# Patient Record
Sex: Female | Born: 1940 | Race: Black or African American | Hispanic: No | Smoking: Former smoker
Health system: Southern US, Community
[De-identification: ages and names within clinical notes are randomized; demographics above are authoritative.]

## PROBLEM LIST (undated history)

## (undated) DIAGNOSIS — M545 Low back pain, unspecified: Secondary | ICD-10-CM

## (undated) DIAGNOSIS — I10 Essential (primary) hypertension: Secondary | ICD-10-CM

## (undated) DIAGNOSIS — E785 Hyperlipidemia, unspecified: Secondary | ICD-10-CM

## (undated) DIAGNOSIS — E876 Hypokalemia: Secondary | ICD-10-CM

## (undated) DIAGNOSIS — M810 Age-related osteoporosis without current pathological fracture: Secondary | ICD-10-CM

## (undated) DIAGNOSIS — G309 Alzheimer's disease, unspecified: Secondary | ICD-10-CM

## (undated) DIAGNOSIS — F0280 Dementia in other diseases classified elsewhere without behavioral disturbance: Secondary | ICD-10-CM

## (undated) DIAGNOSIS — E538 Deficiency of other specified B group vitamins: Secondary | ICD-10-CM

## (undated) DIAGNOSIS — K5909 Other constipation: Secondary | ICD-10-CM

## (undated) DIAGNOSIS — F329 Major depressive disorder, single episode, unspecified: Secondary | ICD-10-CM

## (undated) DIAGNOSIS — N390 Urinary tract infection, site not specified: Secondary | ICD-10-CM

## (undated) DIAGNOSIS — F32A Depression, unspecified: Secondary | ICD-10-CM

## (undated) DIAGNOSIS — M1990 Unspecified osteoarthritis, unspecified site: Secondary | ICD-10-CM

## (undated) DIAGNOSIS — R627 Adult failure to thrive: Secondary | ICD-10-CM

## (undated) DIAGNOSIS — J189 Pneumonia, unspecified organism: Secondary | ICD-10-CM

## (undated) DIAGNOSIS — R569 Unspecified convulsions: Secondary | ICD-10-CM

## (undated) DIAGNOSIS — R319 Hematuria, unspecified: Secondary | ICD-10-CM

## (undated) DIAGNOSIS — G43909 Migraine, unspecified, not intractable, without status migrainosus: Secondary | ICD-10-CM

## (undated) DIAGNOSIS — E870 Hyperosmolality and hypernatremia: Secondary | ICD-10-CM

## (undated) DIAGNOSIS — F482 Pseudobulbar affect: Secondary | ICD-10-CM

## (undated) HISTORY — DX: Low back pain, unspecified: M54.50

## (undated) HISTORY — DX: Unspecified osteoarthritis, unspecified site: M19.90

## (undated) HISTORY — DX: Migraine, unspecified, not intractable, without status migrainosus: G43.909

## (undated) HISTORY — DX: Pneumonia, unspecified organism: J18.9

## (undated) HISTORY — DX: Low back pain: M54.5

## (undated) HISTORY — DX: Hematuria, unspecified: R31.9

## (undated) HISTORY — DX: Hyperosmolality and hypernatremia: E87.0

## (undated) HISTORY — DX: Hypokalemia: E87.6

## (undated) HISTORY — PX: ABDOMINAL HYSTERECTOMY: SHX81

## (undated) HISTORY — DX: Pseudobulbar affect: F48.2

## (undated) HISTORY — DX: Alzheimer's disease, unspecified: G30.9

## (undated) HISTORY — PX: CHOLECYSTECTOMY: SHX55

## (undated) HISTORY — DX: Adult failure to thrive: R62.7

## (undated) HISTORY — DX: Urinary tract infection, site not specified: N39.0

## (undated) HISTORY — DX: Unspecified convulsions: R56.9

## (undated) HISTORY — PX: KNEE SURGERY: SHX244

## (undated) HISTORY — DX: Dementia in other diseases classified elsewhere, unspecified severity, without behavioral disturbance, psychotic disturbance, mood disturbance, and anxiety: F02.80

## (undated) HISTORY — DX: Age-related osteoporosis without current pathological fracture: M81.0

## (undated) HISTORY — DX: Other constipation: K59.09

## (undated) HISTORY — DX: Deficiency of other specified B group vitamins: E53.8

---

## 2013-11-11 ENCOUNTER — Emergency Department (HOSPITAL_BASED_OUTPATIENT_CLINIC_OR_DEPARTMENT_OTHER): Payer: Medicare Other

## 2013-11-11 ENCOUNTER — Encounter (HOSPITAL_BASED_OUTPATIENT_CLINIC_OR_DEPARTMENT_OTHER): Payer: Self-pay | Admitting: Emergency Medicine

## 2013-11-11 ENCOUNTER — Emergency Department (HOSPITAL_BASED_OUTPATIENT_CLINIC_OR_DEPARTMENT_OTHER)
Admission: EM | Admit: 2013-11-11 | Discharge: 2013-11-11 | Disposition: A | Discharge status: Home / Self Care | Payer: Medicare Other | Attending: Emergency Medicine | Admitting: Emergency Medicine

## 2013-11-11 DIAGNOSIS — Z791 Long term (current) use of non-steroidal anti-inflammatories (NSAID): Secondary | ICD-10-CM | POA: Insufficient documentation

## 2013-11-11 DIAGNOSIS — W19XXXA Unspecified fall, initial encounter: Secondary | ICD-10-CM

## 2013-11-11 DIAGNOSIS — R296 Repeated falls: Secondary | ICD-10-CM | POA: Insufficient documentation

## 2013-11-11 DIAGNOSIS — Y929 Unspecified place or not applicable: Secondary | ICD-10-CM | POA: Insufficient documentation

## 2013-11-11 DIAGNOSIS — S79919A Unspecified injury of unspecified hip, initial encounter: Secondary | ICD-10-CM | POA: Insufficient documentation

## 2013-11-11 DIAGNOSIS — E785 Hyperlipidemia, unspecified: Secondary | ICD-10-CM | POA: Insufficient documentation

## 2013-11-11 DIAGNOSIS — F3289 Other specified depressive episodes: Secondary | ICD-10-CM | POA: Insufficient documentation

## 2013-11-11 DIAGNOSIS — IMO0002 Reserved for concepts with insufficient information to code with codable children: Secondary | ICD-10-CM | POA: Insufficient documentation

## 2013-11-11 DIAGNOSIS — S99929A Unspecified injury of unspecified foot, initial encounter: Secondary | ICD-10-CM

## 2013-11-11 DIAGNOSIS — F329 Major depressive disorder, single episode, unspecified: Secondary | ICD-10-CM | POA: Insufficient documentation

## 2013-11-11 DIAGNOSIS — Y939 Activity, unspecified: Secondary | ICD-10-CM | POA: Insufficient documentation

## 2013-11-11 DIAGNOSIS — I10 Essential (primary) hypertension: Secondary | ICD-10-CM | POA: Insufficient documentation

## 2013-11-11 DIAGNOSIS — S79929A Unspecified injury of unspecified thigh, initial encounter: Secondary | ICD-10-CM

## 2013-11-11 DIAGNOSIS — S8990XA Unspecified injury of unspecified lower leg, initial encounter: Secondary | ICD-10-CM | POA: Insufficient documentation

## 2013-11-11 DIAGNOSIS — T07XXXA Unspecified multiple injuries, initial encounter: Secondary | ICD-10-CM

## 2013-11-11 DIAGNOSIS — F0390 Unspecified dementia without behavioral disturbance: Secondary | ICD-10-CM | POA: Insufficient documentation

## 2013-11-11 DIAGNOSIS — Z79899 Other long term (current) drug therapy: Secondary | ICD-10-CM | POA: Insufficient documentation

## 2013-11-11 DIAGNOSIS — S99919A Unspecified injury of unspecified ankle, initial encounter: Secondary | ICD-10-CM

## 2013-11-11 HISTORY — DX: Major depressive disorder, single episode, unspecified: F32.9

## 2013-11-11 HISTORY — DX: Essential (primary) hypertension: I10

## 2013-11-11 HISTORY — DX: Hyperlipidemia, unspecified: E78.5

## 2013-11-11 HISTORY — DX: Depression, unspecified: F32.A

## 2013-11-11 MED ORDER — NAPROXEN 500 MG PO TABS: 500.0000 mg | ORAL_TABLET | Freq: Two times a day (BID) | ORAL | Status: DC

## 2013-11-11 NOTE — ED Provider Notes (Signed)
CSN: [id number]     Arrival date & time 11/11/13  2107 History  This chart was scribed for Glynn Octave, MD by Leone Payor, ED Scribe. This patient was seen in room MH11/MH11 and the patient's care was started 9:57 PM.    Chief Complaint  Patient presents with  . Fall    The history is provided by the patient. No language interpreter was used.    HPI Comments: Melissa Jarvis is a 73 y.o. female with past medical history of dementia, HTN who presents to the Emergency Department complaining of a fall that occurred about 20 hours ago. Pt's daughter states she fell on the first step of the porch but did not witness it. Pt was complaining of a HA and right sided pain including right hip pain for the past 12 hours. She currently complains of right knee pain. Daughter gave pt half a hydrocodone-ibuprofen without relief. She does not take blood thinners aside from ASA. She denies chest pain, SOB, abdominal pain.   Past Medical History  Diagnosis Date  . Depressed   . Hypertension   . Hyperlipidemia   . Dementia    Past Surgical History  Procedure Laterality Date  . Abdominal hysterectomy     No family history on file. History  Substance Use Topics  . Smoking status: Not on file  . Smokeless tobacco: Not on file  . Alcohol Use: Not on file   OB History   Grav Para Term Preterm Abortions TAB SAB Ect Mult Living                 Review of Systems A complete 10 system review of systems was obtained and all systems are negative except as noted in the HPI and PMH.   Allergies  Review of patient's allergies indicates not on file.  Home Medications   Current Outpatient Rx  Name  Route  Sig  Dispense  Refill  . amLODipine-benazepril (LOTREL) 10-40 MG per capsule   Oral   Take 1 capsule by mouth daily.         . celecoxib (CELEBREX) 200 MG capsule   Oral   Take 200 mg by mouth 2 (two) times daily.         . hydrALAZINE (APRESOLINE) 25 MG tablet   Oral   Take 25 mg by mouth  3 (three) times daily.         Marland Kitchen HYDROcodone-ibuprofen (VICOPROFEN) 7.5-200 MG per tablet   Oral   Take 1 tablet by mouth every 8 (eight) hours as needed for moderate pain.         Marland Kitchen PARoxetine (PAXIL) 20 MG tablet   Oral   Take 20 mg by mouth daily.         . pravastatin (PRAVACHOL) 40 MG tablet   Oral   Take 40 mg by mouth daily.         . rivastigmine (EXELON) 9.5 mg/24hr   Transdermal   Place 9.5 mg onto the skin daily.         . naproxen (NAPROSYN) 500 MG tablet   Oral   Take 1 tablet (500 mg total) by mouth 2 (two) times daily.   30 tablet   0    Triage Vitals: BP 197/89  Pulse 62  Temp(Src) 98 F (36.7 C) (Oral)  Resp 16  SpO2 99%  Physical Exam  Nursing note and vitals reviewed. Constitutional: She is oriented to person, place, and time. She appears well-developed and well-nourished.  No distress.  Non-toxic appearing   HENT:  Head: Normocephalic and atraumatic.  Neck: Normal range of motion. Neck supple.  Cardiovascular: Normal rate, regular rhythm and normal heart sounds.   Pulmonary/Chest: Effort normal and breath sounds normal. No respiratory distress. She has no wheezes. She has no rales. She exhibits no tenderness.  Abdominal: Soft. She exhibits no distension and no mass. There is no tenderness. There is no rebound and no guarding.  Musculoskeletal:  Tenderness to palpation to midline lumbar spine. Right knee has well healed surgical incision. Flexion and extension intact. No ligament laxity. Intact distal pulses.   Neurological: She is alert and oriented to person, place, and time.  Cranial nerves 2-12 intact. 5/5 strength throughout, no ataxia on finger to nose. Romberg neg. Normal gait. No nystagmus.    Skin: Skin is warm and dry.  Psychiatric: She has a normal mood and affect.    ED Course  Procedures (including critical care time)  DIAGNOSTIC STUDIES: Oxygen Saturation is 99% on RA, normal by my interpretation.    COORDINATION OF  CARE: 9:53 PM Will order imaging. Discussed treatment plan with pt at bedside and pt agreed to plan.   Labs Review Labs Reviewed - No data to display Imaging Review Dg Lumbar Spine Complete  11/11/2013   CLINICAL DATA:  Trauma, fall, right hip and back pain  EXAM: LUMBAR SPINE - COMPLETE 4+ VIEW  COMPARISON:  None.  FINDINGS: Limited exam because of positioning. Diffuse lumbar spondylosis and degenerative change with a mild scoliosis. Lower thoracic degenerative changes well. No gross malalignment. No significant compression fracture or focal kyphosis. Lateral views are limited because of the oblique projection and positioning. Atherosclerosis of the aorta. Prior cholecystectomy noted. Scattered stool throughout the colon. Negative for obstruction.  IMPRESSION: Diffuse degenerative changes. Limited exam but no definite acute osseous finding.   Electronically Signed   By: Ruel Favorsrevor  Shick M.D.   On: 11/11/2013 23:21   Dg Hip Complete Right  11/11/2013   CLINICAL DATA:  Trauma, fall, right hip pain  EXAM: RIGHT HIP - COMPLETE 2+ VIEW  COMPARISON:  None.  FINDINGS: Bones are osteopenic. Minor degenerative changes of the lumbar spine, SI joints and both hips. Right hip appears intact. No displaced fracture or malalignment. Bony pelvis intact.  IMPRESSION: No acute osseous finding   Electronically Signed   By: Ruel Favorsrevor  Shick M.D.   On: 11/11/2013 23:19   Ct Head Wo Contrast  11/11/2013   CLINICAL DATA:  Unwitnessed fall, headache, hypertension  EXAM: CT HEAD WITHOUT CONTRAST  CT CERVICAL SPINE WITHOUT CONTRAST  TECHNIQUE: Multidetector CT imaging of the head and cervical spine was performed following the standard protocol without intravenous contrast. Multiplanar CT image reconstructions of the cervical spine were also generated.  COMPARISON:  07/02/2012  FINDINGS: CT HEAD FINDINGS  Brain atrophy evident with chronic periventricular microvascular ischemic change in the white matter as before. No acute  intracranial hemorrhage, mass lesion, definite acute infarction, midline shift, herniation, hydrocephalus, or extra-axial fluid collection. Cisterns patent. No cerebellar abnormality. Symmetric orbits. Mastoids and sinuses are clear.  CT CERVICAL SPINE FINDINGS  Normal alignment. Diffuse cervical spondylosis and degenerative disc disease at all levels. no fracture, compression deformity, malalignment, or focal kyphosis. Normal prevertebral soft tissues. Intact odontoid. Facets aligned. Coronary calcifications evident.  IMPRESSION: Stable brain atrophy and chronic microvascular ischemic changes in the white matter. No acute intracranial finding.  Cervical degenerative change and spondylosis without acute osseous finding or fracture.   Electronically Signed  By: Ruel Favors M.D.   On: 11/11/2013 22:43   Ct Cervical Spine Wo Contrast  11/11/2013   CLINICAL DATA:  Unwitnessed fall, headache, hypertension  EXAM: CT HEAD WITHOUT CONTRAST  CT CERVICAL SPINE WITHOUT CONTRAST  TECHNIQUE: Multidetector CT imaging of the head and cervical spine was performed following the standard protocol without intravenous contrast. Multiplanar CT image reconstructions of the cervical spine were also generated.  COMPARISON:  07/02/2012  FINDINGS: CT HEAD FINDINGS  Brain atrophy evident with chronic periventricular microvascular ischemic change in the white matter as before. No acute intracranial hemorrhage, mass lesion, definite acute infarction, midline shift, herniation, hydrocephalus, or extra-axial fluid collection. Cisterns patent. No cerebellar abnormality. Symmetric orbits. Mastoids and sinuses are clear.  CT CERVICAL SPINE FINDINGS  Normal alignment. Diffuse cervical spondylosis and degenerative disc disease at all levels. no fracture, compression deformity, malalignment, or focal kyphosis. Normal prevertebral soft tissues. Intact odontoid. Facets aligned. Coronary calcifications evident.  IMPRESSION: Stable brain atrophy and  chronic microvascular ischemic changes in the white matter. No acute intracranial finding.  Cervical degenerative change and spondylosis without acute osseous finding or fracture.   Electronically Signed   By: Ruel Favors M.D.   On: 11/11/2013 22:43   Dg Knee Complete 4 Views Right  11/11/2013   CLINICAL DATA:  Trauma, fall.  Right knee pain.  EXAM: RIGHT KNEE - COMPLETE 4+ VIEW  COMPARISON:  None available for comparison at time of study interpretation.  FINDINGS: The patient is status post total knee arthroplasties, with intact well seated femoral and acetabular components. No periprosthetic lucency. Normal appearance of the resurfaced patella. No acute fracture deformity or dislocation. No destructive bony lesions. Soft tissue planes are nonsuspicious. Sub cm linear calcification projecting posterior to the proximal fibula could reflect remote injury or vascular etiology.  IMPRESSION: No acute fracture deformity or dislocation.  Status post right knee total arthroplasty without radiographic findings of hardware failure.   Electronically Signed   By: Awilda Metro   On: 11/11/2013 23:26    EKG Interpretation   None       MDM   1. Fall   2. Multiple contusions    Fall at 2 AM last night landing on right side. Hit head or loss of consciousness. Complaining of pain in right leg, hip, knee and low back. No abdominal pain, chest pain or neck pain.  Imaging negative for acute pathology. Patient ambulatory without a problem and feels at her baseline.    I personally performed the services described in this documentation, which was scribed in my presence. The recorded information has been reviewed and is accurate.    Glynn Octave, MD 11/11/13 [phone]

## 2013-11-11 NOTE — ED Notes (Addendum)
Pt c/o fall x lst PM c/o right leg, arm and head pain

## 2013-11-11 NOTE — Discharge Instructions (Signed)

## 2014-03-04 ENCOUNTER — Other Ambulatory Visit (HOSPITAL_BASED_OUTPATIENT_CLINIC_OR_DEPARTMENT_OTHER): Payer: Self-pay | Admitting: Internal Medicine

## 2014-09-06 ENCOUNTER — Emergency Department (HOSPITAL_BASED_OUTPATIENT_CLINIC_OR_DEPARTMENT_OTHER)
Admission: EM | Admit: 2014-09-06 | Discharge: 2014-09-06 | Disposition: A | Discharge status: Home / Self Care | Payer: Medicare Other | Attending: Emergency Medicine | Admitting: Emergency Medicine

## 2014-09-06 ENCOUNTER — Emergency Department (HOSPITAL_BASED_OUTPATIENT_CLINIC_OR_DEPARTMENT_OTHER): Payer: Medicare Other

## 2014-09-06 ENCOUNTER — Encounter (HOSPITAL_BASED_OUTPATIENT_CLINIC_OR_DEPARTMENT_OTHER): Payer: Self-pay | Admitting: *Deleted

## 2014-09-06 DIAGNOSIS — F329 Major depressive disorder, single episode, unspecified: Secondary | ICD-10-CM | POA: Insufficient documentation

## 2014-09-06 DIAGNOSIS — I10 Essential (primary) hypertension: Secondary | ICD-10-CM | POA: Insufficient documentation

## 2014-09-06 DIAGNOSIS — Z791 Long term (current) use of non-steroidal anti-inflammatories (NSAID): Secondary | ICD-10-CM | POA: Diagnosis not present

## 2014-09-06 DIAGNOSIS — Z87891 Personal history of nicotine dependence: Secondary | ICD-10-CM | POA: Insufficient documentation

## 2014-09-06 DIAGNOSIS — Z880 Allergy status to penicillin: Secondary | ICD-10-CM | POA: Insufficient documentation

## 2014-09-06 DIAGNOSIS — M549 Dorsalgia, unspecified: Secondary | ICD-10-CM

## 2014-09-06 DIAGNOSIS — F0390 Unspecified dementia without behavioral disturbance: Secondary | ICD-10-CM | POA: Diagnosis not present

## 2014-09-06 DIAGNOSIS — M545 Low back pain: Secondary | ICD-10-CM | POA: Insufficient documentation

## 2014-09-06 LAB — URINALYSIS, ROUTINE W REFLEX MICROSCOPIC
Bilirubin Urine: NEGATIVE
GLUCOSE, UA: NEGATIVE mg/dL
Hgb urine dipstick: NEGATIVE
KETONES UR: NEGATIVE mg/dL
LEUKOCYTES UA: NEGATIVE
Nitrite: NEGATIVE
Protein, ur: NEGATIVE mg/dL
SPECIFIC GRAVITY, URINE: 1.013 (ref 1.005–1.030)
Urobilinogen, UA: 1 mg/dL (ref 0.0–1.0)
pH: 7 (ref 5.0–8.0)

## 2014-09-06 NOTE — ED Notes (Signed)
Pt reports back pain x 9 months- has seen PCP without any diagnosis or testing- seen at Urgent Care 2 months ago and ?bowel blockage- Last BM today, small per pt report

## 2014-09-06 NOTE — ED Provider Notes (Signed)
CSN: [id number]     Arrival date & time 09/06/14  1429 History  This chart was scribed for Toy BakerAnthony T Kindra Bickham, MD by Modena JanskyAlbert Thayil, ED Scribe. This patient was seen in room MH04/MH04 and the patient's care was started at 3:07 PM.   Chief Complaint  Patient presents with  . Back Pain   The history is provided by the patient. No language interpreter was used.   HPI Comments: Grayland JackBarbara Guarino is a 73 y.o. female who presents to the Emergency Department complaining of constant moderate lower back pain that has been going on for about 9 months. Her daughter reports that pt has been seeing her PCP and Urgent Care for the back pain. She states that pt had an x ray in September revealing a bowel blockage in pt. She reports she wanted another x ray in pt today. She denies any falls or trauma in pt. She states that pt can ambulate okay and has been eating normally. She states that pt has been taking hydrocodone and tylenol without any relief. She denies any unusual incontinence or numbness in pt. She also denies any emesis or fever in pt.   Past Medical History  Diagnosis Date  . Depressed   . Hypertension   . Hyperlipidemia   . Dementia   . Dementia    Past Surgical History  Procedure Laterality Date  . Abdominal hysterectomy    . Cholecystectomy     No family history on file. History  Substance Use Topics  . Smoking status: Former Games developermoker  . Smokeless tobacco: Not on file  . Alcohol Use: Yes     Comment: occasional   OB History    No data available     Review of Systems  Constitutional: Negative for fever and appetite change.  Gastrointestinal: Negative for vomiting.  Musculoskeletal: Positive for back pain.  All other systems reviewed and are negative.   Allergies  Penicillins  Home Medications   Prior to Admission medications   Medication Sig Start Date End Date Taking? Authorizing Provider  amLODipine-benazepril (LOTREL) 10-40 MG per capsule Take 1 capsule by mouth daily.    Yes Historical Provider, MD  celecoxib (CELEBREX) 200 MG capsule Take 200 mg by mouth 2 (two) times daily.   Yes Historical Provider, MD  hydrALAZINE (APRESOLINE) 25 MG tablet Take 25 mg by mouth 3 (three) times daily.   Yes Historical Provider, MD  HYDROcodone-ibuprofen (VICOPROFEN) 7.5-200 MG per tablet Take 1 tablet by mouth every 8 (eight) hours as needed for moderate pain.   Yes Historical Provider, MD  naproxen (NAPROSYN) 500 MG tablet Take 1 tablet (500 mg total) by mouth 2 (two) times daily. 11/11/13  Yes [name], MD  PARoxetine (PAXIL) 20 MG tablet Take 20 mg by mouth daily.   Yes Historical Provider, MD  pravastatin (PRAVACHOL) 40 MG tablet Take 40 mg by mouth daily.   Yes Historical Provider, MD  rivastigmine (EXELON) 9.5 mg/24hr Place 9.5 mg onto the skin daily.   Yes Historical Provider, MD   BP 194/66 mmHg  Pulse 65  Temp(Src) 98.7 F (37.1 C) (Oral)  Resp 20  Ht 5\' 4"  (1.626 m)  Wt 201 lb (91.173 kg)  BMI 34.48 kg/m2  SpO2 98% Physical Exam  Constitutional: She is oriented to person, place, and time. She appears well-developed and well-nourished.  Non-toxic appearance. No distress.  HENT:  Head: Normocephalic and atraumatic.  Eyes: Conjunctivae, EOM and lids are normal. Pupils are equal, round, and reactive to light.  Neck: Normal range of motion. Neck supple. No tracheal deviation present. No thyroid mass present.  Cardiovascular: Normal rate, regular rhythm and normal heart sounds.  Exam reveals no gallop.   No murmur heard. Pulmonary/Chest: Effort normal and breath sounds normal. No stridor. No respiratory distress. She has no decreased breath sounds. She has no wheezes. She has no rhonchi. She has no rales.  Abdominal: Soft. Normal appearance and bowel sounds are normal. She exhibits no distension. There is tenderness. There is no rebound, no guarding and no CVA tenderness.  Mild TTP to the left perispinal area. No CVA tenderness.  Musculoskeletal: Normal range  of motion. She exhibits no edema or tenderness.  Neurological: She is alert and oriented to person, place, and time. She has normal strength. No cranial nerve deficit or sensory deficit. GCS eye subscore is 4. GCS verbal subscore is 5. GCS motor subscore is 6.  Skin: Skin is warm and dry. No abrasion and no rash noted.  Psychiatric: She has a normal mood and affect. Her speech is normal and behavior is normal.  Nursing note and vitals reviewed.   ED Course  Procedures (including critical care time) DIAGNOSTIC STUDIES: Oxygen Saturation is 98% on RA, normal by my interpretation.    COORDINATION OF CARE: 3:11 PM- Pt advised of plan for treatment which includes radiology and labs and pt agrees.  Labs Review Labs Reviewed  URINE CULTURE  URINALYSIS, ROUTINE W REFLEX MICROSCOPIC    Imaging Review Dg Lumbar Spine Complete  09/06/2014   CLINICAL DATA:  Low back pain nine months getting worse.  No injury.  EXAM: LUMBAR SPINE - COMPLETE 4+ VIEW  COMPARISON:  04/13/2014  FINDINGS: Subtle curvature convex to the right unchanged. Vertebral body heights are within normal. There is mild spondylosis throughout the lumbar spine. There is disc space narrowing at the L3-4, L4-5 and L5-S1 levels. There is no compression fracture or subluxation. There is moderate facet arthropathy. There is calcified plaque over the abdominal aorta.  IMPRESSION: Mild spondylosis throughout the lumbar spine with disc disease from the L3-4 level to the L5-S1 level. No acute findings.   Electronically Signed   By: Elberta Fortisaniel  Boyle M.D.   On: 09/06/2014 15:55     EKG Interpretation None      MDM   Final diagnoses:  Back pain    Patient's urinalysis and lumbar spine series without acute findings. Suspect musculoskeletal pain versus osteoarthritis and she is stable for discharge and was see her doctor next week    Toy BakerAnthony T Julian Medina, MD 09/06/14 1649

## 2014-09-06 NOTE — Discharge Instructions (Signed)
Back Pain, Adult Low back pain is very common. About 1 in 5 people have back pain.The cause of low back pain is rarely dangerous. The pain often gets better over time.About half of people with a sudden onset of back pain feel better in just 2 weeks. About 8 in 10 people feel better by 6 weeks.  CAUSES Some common causes of back pain include:  Strain of the muscles or ligaments supporting the spine.  Wear and tear (degeneration) of the spinal discs.  Arthritis.  Direct injury to the back. DIAGNOSIS Most of the time, the direct cause of low back pain is not known.However, back pain can be treated effectively even when the exact cause of the pain is unknown.Answering your caregiver's questions about your overall health and symptoms is one of the most accurate ways to make sure the cause of your pain is not dangerous. If your caregiver needs more information, he or she may order lab work or imaging tests (X-rays or MRIs).However, even if imaging tests show changes in your back, this usually does not require surgery. HOME CARE INSTRUCTIONS For many people, back pain returns.Since low back pain is rarely dangerous, it is often a condition that people can learn to manageon their own.   Remain active. It is stressful on the back to sit or stand in one place. Do not sit, drive, or stand in one place for more than 30 minutes at a time. Take short walks on level surfaces as soon as pain allows.Try to increase the length of time you walk each day.  Do not stay in bed.Resting more than 1 or 2 days can delay your recovery.  Do not avoid exercise or work.Your body is made to move.It is not dangerous to be active, even though your back may hurt.Your back will likely heal faster if you return to being active before your pain is gone.  Pay attention to your body when you bend and lift. Many people have less discomfortwhen lifting if they bend their knees, keep the load close to their bodies,and  avoid twisting. Often, the most comfortable positions are those that put less stress on your recovering back.  Find a comfortable position to sleep. Use a firm mattress and lie on your side with your knees slightly bent. If you lie on your back, put a pillow under your knees.  Only take over-the-counter or prescription medicines as directed by your caregiver. Over-the-counter medicines to reduce pain and inflammation are often the most helpful.Your caregiver may prescribe muscle relaxant drugs.These medicines help dull your pain so you can more quickly return to your normal activities and healthy exercise.  Put ice on the injured area.  Put ice in a plastic bag.  Place a towel between your skin and the bag.  Leave the ice on for 15-20 minutes, 03-04 times a day for the first 2 to 3 days. After that, ice and heat may be alternated to reduce pain and spasms.  Ask your caregiver about trying back exercises and gentle massage. This may be of some benefit.  Avoid feeling anxious or stressed.Stress increases muscle tension and can worsen back pain.It is important to recognize when you are anxious or stressed and learn ways to manage it.Exercise is a great option. SEEK MEDICAL CARE IF:  You have pain that is not relieved with rest or medicine.  You have pain that does not improve in 1 week.  You have new symptoms.  You are generally not feeling well. SEEK   IMMEDIATE MEDICAL CARE IF:   You have pain that radiates from your back into your legs.  You develop new bowel or bladder control problems.  You have unusual weakness or numbness in your arms or legs.  You develop nausea or vomiting.  You develop abdominal pain.  You feel faint. Document Released: 10/03/2005 Document Revised: 04/03/2012 Document Reviewed: 02/04/2014 [employer] Patient Information 2015 [employer]. This information is not intended to replace advice given to you by your health care provider. Make sure you  discuss any questions you have with your health care provider.  

## 2014-09-07 LAB — URINE CULTURE

## 2014-12-16 ENCOUNTER — Emergency Department (HOSPITAL_BASED_OUTPATIENT_CLINIC_OR_DEPARTMENT_OTHER)
Admission: EM | Admit: 2014-12-16 | Discharge: 2014-12-16 | Disposition: A | Discharge status: Home / Self Care | Payer: Medicare Other | Attending: Emergency Medicine | Admitting: Emergency Medicine

## 2014-12-16 ENCOUNTER — Emergency Department (HOSPITAL_BASED_OUTPATIENT_CLINIC_OR_DEPARTMENT_OTHER): Payer: Medicare Other

## 2014-12-16 ENCOUNTER — Encounter (HOSPITAL_BASED_OUTPATIENT_CLINIC_OR_DEPARTMENT_OTHER): Payer: Self-pay | Admitting: *Deleted

## 2014-12-16 DIAGNOSIS — Z79899 Other long term (current) drug therapy: Secondary | ICD-10-CM | POA: Insufficient documentation

## 2014-12-16 DIAGNOSIS — S0101XA Laceration without foreign body of scalp, initial encounter: Secondary | ICD-10-CM | POA: Insufficient documentation

## 2014-12-16 DIAGNOSIS — E785 Hyperlipidemia, unspecified: Secondary | ICD-10-CM | POA: Insufficient documentation

## 2014-12-16 DIAGNOSIS — F0390 Unspecified dementia without behavioral disturbance: Secondary | ICD-10-CM | POA: Insufficient documentation

## 2014-12-16 DIAGNOSIS — Y998 Other external cause status: Secondary | ICD-10-CM | POA: Diagnosis not present

## 2014-12-16 DIAGNOSIS — Z87891 Personal history of nicotine dependence: Secondary | ICD-10-CM | POA: Insufficient documentation

## 2014-12-16 DIAGNOSIS — W19XXXA Unspecified fall, initial encounter: Secondary | ICD-10-CM

## 2014-12-16 DIAGNOSIS — S299XXA Unspecified injury of thorax, initial encounter: Secondary | ICD-10-CM | POA: Diagnosis not present

## 2014-12-16 DIAGNOSIS — Z880 Allergy status to penicillin: Secondary | ICD-10-CM | POA: Insufficient documentation

## 2014-12-16 DIAGNOSIS — R001 Bradycardia, unspecified: Secondary | ICD-10-CM | POA: Diagnosis not present

## 2014-12-16 DIAGNOSIS — Y92009 Unspecified place in unspecified non-institutional (private) residence as the place of occurrence of the external cause: Secondary | ICD-10-CM | POA: Diagnosis not present

## 2014-12-16 DIAGNOSIS — I10 Essential (primary) hypertension: Secondary | ICD-10-CM | POA: Insufficient documentation

## 2014-12-16 DIAGNOSIS — Z791 Long term (current) use of non-steroidal anti-inflammatories (NSAID): Secondary | ICD-10-CM | POA: Diagnosis not present

## 2014-12-16 DIAGNOSIS — F329 Major depressive disorder, single episode, unspecified: Secondary | ICD-10-CM | POA: Diagnosis not present

## 2014-12-16 DIAGNOSIS — W1839XA Other fall on same level, initial encounter: Secondary | ICD-10-CM | POA: Insufficient documentation

## 2014-12-16 DIAGNOSIS — Z23 Encounter for immunization: Secondary | ICD-10-CM | POA: Diagnosis not present

## 2014-12-16 DIAGNOSIS — Y9301 Activity, walking, marching and hiking: Secondary | ICD-10-CM | POA: Insufficient documentation

## 2014-12-16 DIAGNOSIS — S0990XA Unspecified injury of head, initial encounter: Secondary | ICD-10-CM | POA: Diagnosis present

## 2014-12-16 LAB — BASIC METABOLIC PANEL
Anion gap: 6 (ref 5–15)
BUN: 14 mg/dL (ref 6–23)
CO2: 29 mmol/L (ref 19–32)
Calcium: 9.1 mg/dL (ref 8.4–10.5)
Chloride: 109 mmol/L (ref 96–112)
Creatinine, Ser: 0.7 mg/dL (ref 0.50–1.10)
GFR, EST NON AFRICAN AMERICAN: 84 mL/min — AB (ref >90)
Glucose, Bld: 90 mg/dL (ref 70–99)
POTASSIUM: 3.8 mmol/L (ref 3.5–5.1)
SODIUM: 144 mmol/L (ref 135–145)

## 2014-12-16 LAB — CBC WITH DIFFERENTIAL/PLATELET
BASOS ABS: 0 10*3/uL (ref 0.0–0.1)
BASOS PCT: 1 % (ref 0–1)
EOS ABS: 0.1 10*3/uL (ref 0.0–0.7)
EOS PCT: 2 % (ref 0–5)
HEMATOCRIT: 40.9 % (ref 36.0–46.0)
Hemoglobin: 13.5 g/dL (ref 12.0–15.0)
LYMPHS PCT: 22 % (ref 12–46)
Lymphs Abs: 0.9 10*3/uL (ref 0.7–4.0)
MCH: 31.9 pg (ref 26.0–34.0)
MCHC: 33 g/dL (ref 30.0–36.0)
MCV: 96.7 fL (ref 78.0–100.0)
Monocytes Absolute: 0.3 10*3/uL (ref 0.1–1.0)
Monocytes Relative: 6 % (ref 3–12)
Neutro Abs: 2.9 10*3/uL (ref 1.7–7.7)
Neutrophils Relative %: 69 % (ref 43–77)
PLATELETS: 134 10*3/uL — AB (ref 150–400)
RBC: 4.23 MIL/uL (ref 3.87–5.11)
RDW: 12.5 % (ref 11.5–15.5)
WBC: 4.2 10*3/uL (ref 4.0–10.5)

## 2014-12-16 LAB — URINALYSIS, ROUTINE W REFLEX MICROSCOPIC
BILIRUBIN URINE: NEGATIVE
GLUCOSE, UA: NEGATIVE mg/dL
HGB URINE DIPSTICK: NEGATIVE
Ketones, ur: NEGATIVE mg/dL
Leukocytes, UA: NEGATIVE
Nitrite: NEGATIVE
Protein, ur: NEGATIVE mg/dL
SPECIFIC GRAVITY, URINE: 1.01 (ref 1.005–1.030)
Urobilinogen, UA: 1 mg/dL (ref 0.0–1.0)
pH: 7 (ref 5.0–8.0)

## 2014-12-16 MED ORDER — LIDOCAINE-EPINEPHRINE 2 %-1:100000 IJ SOLN
INTRAMUSCULAR | Status: AC
  Administered 2014-12-16: 20 mL
  Filled 2014-12-16: qty 1

## 2014-12-16 MED ORDER — HYDROCODONE-ACETAMINOPHEN 5-325 MG PO TABS: 1.0000 | ORAL_TABLET | Freq: Four times a day (QID) | ORAL | Status: DC | PRN

## 2014-12-16 MED ORDER — HYDROCODONE-ACETAMINOPHEN 5-325 MG PO TABS
1.0000 | ORAL_TABLET | Freq: Once | ORAL | Status: AC
  Administered 2014-12-16: 1 via ORAL
  Filled 2014-12-16: qty 1

## 2014-12-16 MED ORDER — LIDOCAINE-EPINEPHRINE 2 %-1:100000 IJ SOLN
20.0000 mL | Freq: Once | INTRAMUSCULAR | Status: AC
  Administered 2014-12-16: 20 mL

## 2014-12-16 MED ORDER — TETANUS-DIPHTH-ACELL PERTUSSIS 5-2.5-18.5 LF-MCG/0.5 IM SUSP
0.5000 mL | Freq: Once | INTRAMUSCULAR | Status: AC
  Administered 2014-12-16: 0.5 mL via INTRAMUSCULAR
  Filled 2014-12-16: qty 0.5

## 2014-12-16 NOTE — ED Provider Notes (Signed)
CSN: [id number]     Arrival date & time 12/16/14  1054 History   None    Chief Complaint  Patient presents with  . Fall   Level V caveat dementia, history is obtained from her daughters who accompany her  (Consider location/radiation/quality/duration/timing/severity/associated sxs/prior Treatment) HPI Patient has fallen twice since 3:30 AM today. She fell at home while walking. First fall was at 3:30 AM with no injury. Fall unwitnessed. Second fall occurred at 6:30 AM today. She injured her head and right ribs as result of event and suffered laceration to her scalp. Presently she complains of right rib pain. She denies other complaint. No treatment prior to coming here. Patient  Started on [place]  amd Mirtazapin on 12/10/2014. Mirtazapin started for mood stabilization and orphenadrine started for "knee pain Past Medical History  Diagnosis Date  . Depressed   . Hypertension   . Hyperlipidemia   . Dementia   . Dementia    Past Surgical History  Procedure Laterality Date  . Abdominal hysterectomy    . Cholecystectomy     No family history on file. History  Substance Use Topics  . Smoking status: Former Games developer  . Smokeless tobacco: Not on file  . Alcohol Use: No     Comment: occasional   no tobacco no alcohol no drugs OB History    No data available     Review of Systems  Unable to perform ROS Cardiovascular: Positive for chest pain.       Right rib pain  Musculoskeletal: Positive for gait problem.  Skin: Positive for wound.       Scalp laceration   otherwise unobtainable, patient's demented    Allergies  Penicillins  Home Medications   Prior to Admission medications   Medication Sig Start Date End Date Taking? Authorizing Provider  amLODipine-benazepril (LOTREL) 10-40 MG per capsule Take 1 capsule by mouth daily.    Historical Provider, MD  celecoxib (CELEBREX) 200 MG capsule Take 200 mg by mouth 2 (two) times daily.    Historical Provider, MD  hydrALAZINE  (APRESOLINE) 25 MG tablet Take 25 mg by mouth 3 (three) times daily.    Historical Provider, MD  HYDROcodone-ibuprofen (VICOPROFEN) 7.5-200 MG per tablet Take 1 tablet by mouth every 8 (eight) hours as needed for moderate pain.    Historical Provider, MD  naproxen (NAPROSYN) 500 MG tablet Take 1 tablet (500 mg total) by mouth 2 (two) times daily. 11/11/13   Glynn Octave, MD  PARoxetine (PAXIL) 20 MG tablet Take 20 mg by mouth daily.    Historical Provider, MD  pravastatin (PRAVACHOL) 40 MG tablet Take 40 mg by mouth daily.    Historical Provider, MD  rivastigmine (EXELON) 9.5 mg/24hr Place 9.5 mg onto the skin daily.    Historical Provider, MD   BP 195/79 mmHg  Pulse 58  Temp(Src) 97.8 F (36.6 C) (Oral)  Resp 18  Ht  (1.6 m)  Wt 217 lb (98.431 kg)  BMI 38.45 kg/m2  SpO2 98% Physical Exam  Constitutional: She appears well-developed and well-nourished. No distress.  HENT:  Right Ear: External ear normal.  Left Ear: External ear normal.  Bilateral tympanic membranes normal. 3 cm linear scalp laceration with surrounding hematoma at left occipital area  Eyes: Conjunctivae are normal. Pupils are equal, round, and reactive to light.  Neck: Neck supple. No tracheal deviation present. No thyromegaly present.  No bony tenderness  Cardiovascular: Regular rhythm.   Mildly bradycardic  Pulmonary/Chest: Effort normal and breath  sounds normal.  Tender over right anterolateral rib cage. No ecchymosis and or flail  Abdominal: Soft. Bowel sounds are normal. She exhibits no distension. There is no tenderness.  Obese  Musculoskeletal: Normal range of motion. She exhibits no edema or tenderness.  Entire spine nontender. Pelvis stable nontender. All 4 extremities no contusion abrasion or tenderness neurovascularly intact  Neurological: She is alert. Coordination normal.  Moves all extremities follow simple commands cranial nerves II through XII grossly intact  Skin: Skin is warm and dry. No rash  noted.  Psychiatric: She has a normal mood and affect.  Nursing note and vitals reviewed.   ED Course  Procedures (including critical care time) Labs Review Labs Reviewed - No data to display  Imaging Review No results found.   EKG Interpretation   Date/Time:  Tuesday December 16 2014 12:50:33 EST Ventricular Rate:  57 PR Interval:  158 QRS Duration: 76 QT Interval:  412 QTC Calculation: 401 R Axis:   37 Text Interpretation:  Sinus bradycardia Otherwise normal ECG No old  tracing to compare Confirmed by Linda Grimmer  MD, Lanard Arguijo [phone]) on 12/16/2014  1:11:16 PM     2:25 PM patient is alert and able to ambulate unassisted. She still complains of some pain at right ribs upon sitting up from supine position.  MDM LACERATION REPAIR Performed by: Doug Sou Authorized by: Doug Sou Consent: Verbal consent obtained. Risks and benefits: risks, benefits and alternatives were discussed Consent given by: patient Patient identity confirmed: provided demographic data Prepped and Draped in normal sterile fashion Wound explored  Laceration Location: scalp  Laceration Length: 3cm  No Foreign Bodies seen or palpated  Anesthesia: local infiltration  Local anesthetic: lidocaine 2% with epinephrine  Anesthetic total: 2 ml  Irrigation method: syringe Amount of cleaning: standard  Skin closure: staples  Number of staples 3   Patient tolerance: Patient tolerated the procedure well with no immediate complications.  Results for orders placed or performed during the hospital encounter of 12/16/14  CBC with Differential/Platelet  Result Value Ref Range   WBC 4.2 4.0 - 10.5 K/uL   RBC 4.23 3.87 - 5.11 MIL/uL   Hemoglobin 13.5 12.0 - 15.0 g/dL   HCT 09.2 33.0 - 07.6 %   MCV 96.7 78.0 - 100.0 fL   MCH 31.9 26.0 - 34.0 pg   MCHC 33.0 30.0 - 36.0 g/dL   RDW 22.6 33.3 - 54.5 %   Platelets 134 (L) 150 - 400 K/uL   Neutrophils Relative % 69 43 - 77 %   Neutro Abs 2.9 1.7 - 7.7  K/uL   Lymphocytes Relative 22 12 - 46 %   Lymphs Abs 0.9 0.7 - 4.0 K/uL   Monocytes Relative 6 3 - 12 %   Monocytes Absolute 0.3 0.1 - 1.0 K/uL   Eosinophils Relative 2 0 - 5 %   Eosinophils Absolute 0.1 0.0 - 0.7 K/uL   Basophils Relative 1 0 - 1 %   Basophils Absolute 0.0 0.0 - 0.1 K/uL  Basic metabolic panel  Result Value Ref Range   Sodium 144 135 - 145 mmol/L   Potassium 3.8 3.5 - 5.1 mmol/L   Chloride 109 96 - 112 mmol/L   CO2 29 19 - 32 mmol/L   Glucose, Bld 90 70 - 99 mg/dL   BUN 14 6 - 23 mg/dL   Creatinine, Ser 6.25 0.50 - 1.10 mg/dL   Calcium 9.1 8.4 - 63.8 mg/dL   GFR calc non Af Amer 84 (L) >90 mL/min  GFR calc Af Amer >90 >90 mL/min   Anion gap 6 5 - 15  Urinalysis, Routine w reflex microscopic  Result Value Ref Range   Color, Urine YELLOW YELLOW   APPearance CLEAR CLEAR   Specific Gravity, Urine 1.010 1.005 - 1.030   pH 7.0 5.0 - 8.0   Glucose, UA NEGATIVE NEGATIVE mg/dL   Hgb urine dipstick NEGATIVE NEGATIVE   Bilirubin Urine NEGATIVE NEGATIVE   Ketones, ur NEGATIVE NEGATIVE mg/dL   Protein, ur NEGATIVE NEGATIVE mg/dL   Urobilinogen, UA 1.0 0.0 - 1.0 mg/dL   Nitrite NEGATIVE NEGATIVE   Leukocytes, UA NEGATIVE NEGATIVE   Dg Ribs Unilateral W/chest Right  12/16/2014   CLINICAL DATA:  Fall today.  RIGHT lower rib pain.  Dementia.  EXAM: RIGHT RIBS AND CHEST - 3+ VIEW  COMPARISON:  Chest radiograph 03/29/2013.  FINDINGS: There is no pneumothorax. The cardiopericardial silhouette is within normal limits. Lung volumes are lower than on the prior exam. Bilateral glenohumeral osteoarthritis is present.  There is no displaced rib fracture. Cholecystectomy clips are present in the right upper quadrant. No pleural fluid.  IMPRESSION: No displaced rib fracture or pneumothorax.  Low lung volumes.   Electronically Signed   By: Andreas Newport M.D.   On: 12/16/2014 13:31   Ct Head Wo Contrast  12/16/2014   CLINICAL DATA:  Fall. Posterior LEFT head laceration. Dementia. Head  trauma. Initial encounter.  EXAM: CT HEAD WITHOUT CONTRAST  CT CERVICAL SPINE WITHOUT CONTRAST  TECHNIQUE: Multidetector CT imaging of the head and cervical spine was performed following the standard protocol without intravenous contrast. Multiplanar CT image reconstructions of the cervical spine were also generated.  COMPARISON:  11/11/2013 head and cervical spine CT.  FINDINGS: CT HEAD FINDINGS  No mass lesion, mass effect, midline shift, hydrocephalus, hemorrhage. No acute territorial cortical ischemia/infarct. Atrophy and chronic ischemic white matter disease is present. The mastoid air cells and paranasal sinuses are clear. Intracranial atherosclerosis is present.  There is a scalp hematoma in the LEFT occipital parietal region. No underlying skull fracture. No adjacent intracranial hemorrhage.  CT CERVICAL SPINE FINDINGS  Alignment: Unchanged and within normal limits.  Craniocervical junction: The odontoid is intact. Atlantodental degenerative disease. The occipital condyles appear intact. C1 ring intact.  Vertebrae: Negative for fracture.  No aggressive osseous lesions.  Central canal: Moderate central stenosis is present at C3-C4 associated with the chronic disc osteophyte complex. Less pronounced stenosis present at C6-C7.  Paraspinal soft tissues: Dense carotid atherosclerosis.  Lung apices: Within normal limits.  IMPRESSION: 1. Atrophy and chronic ischemic white matter disease without acute intracranial abnormality. 2. LEFT occipital parietal scalp hematoma. 3. Unchanged moderate multilevel cervical spondylosis.   Electronically Signed   By: Andreas Newport M.D.   On: 12/16/2014 13:36   Ct Cervical Spine Wo Contrast  12/16/2014   CLINICAL DATA:  Fall. Posterior LEFT head laceration. Dementia. Head trauma. Initial encounter.  EXAM: CT HEAD WITHOUT CONTRAST  CT CERVICAL SPINE WITHOUT CONTRAST  TECHNIQUE: Multidetector CT imaging of the head and cervical spine was performed following the standard protocol  without intravenous contrast. Multiplanar CT image reconstructions of the cervical spine were also generated.  COMPARISON:  11/11/2013 head and cervical spine CT.  FINDINGS: CT HEAD FINDINGS  No mass lesion, mass effect, midline shift, hydrocephalus, hemorrhage. No acute territorial cortical ischemia/infarct. Atrophy and chronic ischemic white matter disease is present. The mastoid air cells and paranasal sinuses are clear. Intracranial atherosclerosis is present.  There is a scalp hematoma  in the LEFT occipital parietal region. No underlying skull fracture. No adjacent intracranial hemorrhage.  CT CERVICAL SPINE FINDINGS  Alignment: Unchanged and within normal limits.  Craniocervical junction: The odontoid is intact. Atlantodental degenerative disease. The occipital condyles appear intact. C1 ring intact.  Vertebrae: Negative for fracture.  No aggressive osseous lesions.  Central canal: Moderate central stenosis is present at C3-C4 associated with the chronic disc osteophyte complex. Less pronounced stenosis present at C6-C7.  Paraspinal soft tissues: Dense carotid atherosclerosis.  Lung apices: Within normal limits.  IMPRESSION: 1. Atrophy and chronic ischemic white matter disease without acute intracranial abnormality. 2. LEFT occipital parietal scalp hematoma. 3. Unchanged moderate multilevel cervical spondylosis.   Electronically Signed   By: Andreas NewportGeoffrey  Lamke M.D.   On: 12/16/2014 13:36    Final diagnoses:  None  MDM  case discussed with hospital pharmacist. Both orphenadrine and Mirtazapin can lead to falls. Therefore we will discontinue both. She'll be prescribed Norco for pain . I suggested to her daughters that she get a walker Staples to come out in one week. Blood pressure recheck Diagnosis #1 fall   #2 minor closed head trauma #3 cm scalp laceration #4 blunt chest trauma #5 hypertension    Doug SouSam Braian Tijerina, MD 12/16/14 1437

## 2014-12-16 NOTE — Discharge Instructions (Signed)
Fall Prevention and Home Safety  Stop Orphenadrine and  Mirtazapin, as they can contribute to falls. MelissaJarvis can have Tylenol for mild pain or the pain medicine prescribed for bad pain. She should get a walker to prevent more falls. Call Dr.Osei Bonsu today to advise office that her medications have been changed. Staples to come out in one week.Ask Dr. Cloyd Stagerssei Bonsu's office if they are able to take them out. If not, go to an urgent care center or return here. Melissa Jarvis's blood pressure should be rechecked in a week, as today's was elevated at 174/71 Falls cause injuries and can affect all age groups. It is possible to prevent falls.  HOW TO PREVENT FALLS  Wear shoes with rubber soles that do not have an opening for your toes.  Keep the inside and outside of your house well lit.  Use night lights throughout your home.  Remove clutter from floors.  Clean up floor spills.  Remove throw rugs or fasten them to the floor with carpet tape.  Do not place electrical cords across pathways.  Put grab bars by your tub, shower, and toilet. Do not use towel bars as grab bars.  Put handrails on both sides of the stairway. Fix loose handrails.  Do not climb on stools or stepladders, if possible.  Do not wax your floors.  Repair uneven or unsafe sidewalks, walkways, or stairs.  Keep items you use a lot within reach.  Be aware of pets.  Keep emergency numbers next to the telephone.  Put smoke detectors in your home and near bedrooms. Ask your doctor what other things you can do to prevent falls. Document Released: 07/30/2009 Document Revised: 04/03/2012 Document Reviewed: 01/03/2012 [facility] Patient Information 2015 [place], [place]. This information is not intended to replace advice given to you by your health care provider. Make sure you discuss any questions you have with your health care provider.

## 2014-12-16 NOTE — ED Notes (Signed)
Patient transported to CT 

## 2014-12-16 NOTE — ED Notes (Signed)
Pt's daughter sts that the pt fell this morning while going to the bathroom but was not injured. Pt then fell again causing a laceration to the back left of her head and pt is c/o right side pain. Pt denies loc. She sts she "just fell" like her legs gave out.

## 2015-07-09 DIAGNOSIS — M170 Bilateral primary osteoarthritis of knee: Secondary | ICD-10-CM | POA: Diagnosis not present

## 2015-07-09 DIAGNOSIS — F0390 Unspecified dementia without behavioral disturbance: Secondary | ICD-10-CM | POA: Diagnosis not present

## 2015-07-09 DIAGNOSIS — M545 Low back pain: Secondary | ICD-10-CM | POA: Diagnosis not present

## 2015-07-09 DIAGNOSIS — I10 Essential (primary) hypertension: Secondary | ICD-10-CM | POA: Diagnosis not present

## 2015-07-09 DIAGNOSIS — F329 Major depressive disorder, single episode, unspecified: Secondary | ICD-10-CM | POA: Diagnosis not present

## 2015-07-09 DIAGNOSIS — E785 Hyperlipidemia, unspecified: Secondary | ICD-10-CM | POA: Diagnosis not present

## 2015-08-13 DIAGNOSIS — Z131 Encounter for screening for diabetes mellitus: Secondary | ICD-10-CM | POA: Diagnosis not present

## 2015-08-13 DIAGNOSIS — I10 Essential (primary) hypertension: Secondary | ICD-10-CM | POA: Diagnosis not present

## 2015-08-13 DIAGNOSIS — M545 Low back pain: Secondary | ICD-10-CM | POA: Diagnosis not present

## 2015-08-13 DIAGNOSIS — E785 Hyperlipidemia, unspecified: Secondary | ICD-10-CM | POA: Diagnosis not present

## 2015-08-13 DIAGNOSIS — F0390 Unspecified dementia without behavioral disturbance: Secondary | ICD-10-CM | POA: Diagnosis not present

## 2015-08-13 DIAGNOSIS — M170 Bilateral primary osteoarthritis of knee: Secondary | ICD-10-CM | POA: Diagnosis not present

## 2015-11-05 DIAGNOSIS — Z0110 Encounter for examination of ears and hearing without abnormal findings: Secondary | ICD-10-CM | POA: Diagnosis not present

## 2015-11-05 DIAGNOSIS — E785 Hyperlipidemia, unspecified: Secondary | ICD-10-CM | POA: Diagnosis not present

## 2015-11-05 DIAGNOSIS — I10 Essential (primary) hypertension: Secondary | ICD-10-CM | POA: Diagnosis not present

## 2015-11-05 DIAGNOSIS — M170 Bilateral primary osteoarthritis of knee: Secondary | ICD-10-CM | POA: Diagnosis not present

## 2015-11-05 DIAGNOSIS — Z7689 Persons encountering health services in other specified circumstances: Secondary | ICD-10-CM | POA: Diagnosis not present

## 2015-11-05 DIAGNOSIS — Z131 Encounter for screening for diabetes mellitus: Secondary | ICD-10-CM | POA: Diagnosis not present

## 2015-11-05 DIAGNOSIS — M545 Low back pain: Secondary | ICD-10-CM | POA: Diagnosis not present

## 2015-11-10 DIAGNOSIS — Z201 Contact with and (suspected) exposure to tuberculosis: Secondary | ICD-10-CM | POA: Diagnosis not present

## 2016-01-04 ENCOUNTER — Encounter (HOSPITAL_BASED_OUTPATIENT_CLINIC_OR_DEPARTMENT_OTHER): Payer: Self-pay

## 2016-01-04 ENCOUNTER — Emergency Department (HOSPITAL_BASED_OUTPATIENT_CLINIC_OR_DEPARTMENT_OTHER): Payer: Medicare Other

## 2016-01-04 ENCOUNTER — Emergency Department (HOSPITAL_BASED_OUTPATIENT_CLINIC_OR_DEPARTMENT_OTHER)
Admission: EM | Admit: 2016-01-04 | Discharge: 2016-01-04 | Disposition: A | Discharge status: Home / Self Care | Payer: Medicare Other | Attending: Emergency Medicine | Admitting: Emergency Medicine

## 2016-01-04 DIAGNOSIS — Z87891 Personal history of nicotine dependence: Secondary | ICD-10-CM | POA: Insufficient documentation

## 2016-01-04 DIAGNOSIS — R55 Syncope and collapse: Secondary | ICD-10-CM | POA: Diagnosis not present

## 2016-01-04 DIAGNOSIS — R51 Headache: Secondary | ICD-10-CM | POA: Diagnosis not present

## 2016-01-04 DIAGNOSIS — Z79899 Other long term (current) drug therapy: Secondary | ICD-10-CM | POA: Diagnosis not present

## 2016-01-04 DIAGNOSIS — I10 Essential (primary) hypertension: Secondary | ICD-10-CM | POA: Diagnosis not present

## 2016-01-04 DIAGNOSIS — F329 Major depressive disorder, single episode, unspecified: Secondary | ICD-10-CM | POA: Insufficient documentation

## 2016-01-04 DIAGNOSIS — E785 Hyperlipidemia, unspecified: Secondary | ICD-10-CM | POA: Insufficient documentation

## 2016-01-04 DIAGNOSIS — E860 Dehydration: Secondary | ICD-10-CM

## 2016-01-04 DIAGNOSIS — Z791 Long term (current) use of non-steroidal anti-inflammatories (NSAID): Secondary | ICD-10-CM | POA: Diagnosis not present

## 2016-01-04 DIAGNOSIS — Z880 Allergy status to penicillin: Secondary | ICD-10-CM | POA: Diagnosis not present

## 2016-01-04 DIAGNOSIS — Z7982 Long term (current) use of aspirin: Secondary | ICD-10-CM | POA: Insufficient documentation

## 2016-01-04 DIAGNOSIS — F0390 Unspecified dementia without behavioral disturbance: Secondary | ICD-10-CM | POA: Insufficient documentation

## 2016-01-04 LAB — BASIC METABOLIC PANEL
Anion gap: 7 (ref 5–15)
BUN: 22 mg/dL — ABNORMAL HIGH (ref 6–20)
CALCIUM: 9.2 mg/dL (ref 8.9–10.3)
CO2: 27 mmol/L (ref 22–32)
CREATININE: 1.07 mg/dL — AB (ref 0.44–1.00)
Chloride: 107 mmol/L (ref 101–111)
GFR calc non Af Amer: 50 mL/min — ABNORMAL LOW (ref >60)
GFR, EST AFRICAN AMERICAN: 58 mL/min — AB (ref >60)
GLUCOSE: 119 mg/dL — AB (ref 65–99)
Potassium: 4.4 mmol/L (ref 3.5–5.1)
Sodium: 141 mmol/L (ref 135–145)

## 2016-01-04 LAB — CBC WITH DIFFERENTIAL/PLATELET
BASOS PCT: 0 %
Basophils Absolute: 0 10*3/uL (ref 0.0–0.1)
EOS ABS: 0.1 10*3/uL (ref 0.0–0.7)
Eosinophils Relative: 1 %
HCT: 39.9 % (ref 36.0–46.0)
Hemoglobin: 13.3 g/dL (ref 12.0–15.0)
Lymphocytes Relative: 15 %
Lymphs Abs: 0.8 10*3/uL (ref 0.7–4.0)
MCH: 32 pg (ref 26.0–34.0)
MCHC: 33.3 g/dL (ref 30.0–36.0)
MCV: 95.9 fL (ref 78.0–100.0)
MONO ABS: 0.4 10*3/uL (ref 0.1–1.0)
MONOS PCT: 7 %
Neutro Abs: 4.2 10*3/uL (ref 1.7–7.7)
Neutrophils Relative %: 77 %
PLATELETS: 152 10*3/uL (ref 150–400)
RBC: 4.16 MIL/uL (ref 3.87–5.11)
RDW: 12.5 % (ref 11.5–15.5)
WBC: 5.4 10*3/uL (ref 4.0–10.5)

## 2016-01-04 LAB — TROPONIN I

## 2016-01-04 NOTE — ED Provider Notes (Signed)
CSN: [id number]     Arrival date & time 01/04/16  1820 History  By signing my name below, I, Melissa Jarvis, attest that this documentation has been prepared under the direction and in the presence of Melissa Nayobert Breeann Reposa, MD. Electronically Signed: Budd PalmerVanessa Jarvis, ED Scribe. 01/04/2016. 6:58 PM.    Chief Complaint  Patient presents with  . Loss of Consciousness   The history is provided by the patient and a relative. No language interpreter was used.   HPI Comments: Level 5 Caveat (Dementia) Richardson ChiquitoBarbara S Jarvis is a 75 y.o. female former smoker with a PMHx of dementia, HTN, HLD, and depression who presents to the Emergency Department complaining of LOC that occurred just PTA. Pt reports associated headache. Per daughter, pt had just gotten out of the car and was talking to someone for a few minutes, who then came to get the daughter because pt states she was not feeling well. She then ran outside to see pt staggering down the sidewalk, reaching pt just in time to catch her when she passed out, preventing her from falling.  Pt is allergic to penicillins.   Past Medical History  Diagnosis Date  . Depressed   . Hypertension   . Hyperlipidemia   . Dementia   . Dementia    Past Surgical History  Procedure Laterality Date  . Abdominal hysterectomy    . Cholecystectomy     No family history on file. Social History  Substance Use Topics  . Smoking status: Former Games developermoker  . Smokeless tobacco: None  . Alcohol Use: No   OB History    No data available     Review of Systems  Unable to perform ROS: Dementia  Neurological: Positive for syncope and headaches.    Allergies  Penicillins  Home Medications   Prior to Admission medications   Medication Sig Start Date End Date Taking? Authorizing Provider  amLODipine-benazepril (LOTREL) 10-40 MG per capsule Take 1 capsule by mouth daily.   Yes Historical Provider, MD  aspirin (ASPIRIN EC) 81 MG EC tablet Take 81 mg by mouth daily. Swallow whole.    Yes Historical Provider, MD  Calcium Carb-Cholecalciferol 600-800 MG-UNIT TABS Take 1 tablet by mouth every morning.   Yes Historical Provider, MD  hydrALAZINE (APRESOLINE) 50 MG tablet Take 50 mg by mouth 2 (two) times daily.   Yes Historical Provider, MD  Linaclotide Karlene Einstein(LINZESS) 145 MCG CAPS capsule Take 145 mcg by mouth daily.   Yes Historical Provider, MD  memantine (NAMENDA XR) 28 MG CP24 24 hr capsule Take 28 mg by mouth at bedtime.   Yes Historical Provider, MD  naproxen (NAPROSYN) 500 MG tablet Take 1 tablet (500 mg total) by mouth 2 (two) times daily. 11/11/13   [name], MD  Omega-3 Fatty Acids (FISH OIL) 1200 MG CAPS Take 1 capsule by mouth every morning.   Yes Historical Provider, MD  PARoxetine (PAXIL) 30 MG tablet Take 30 mg by mouth daily.   Yes Historical Provider, MD  pravastatin (PRAVACHOL) 80 MG tablet Take 80 mg by mouth at bedtime.   Yes Historical Provider, MD  rivastigmine (EXELON) 3 MG capsule Take 3 mg by mouth 3 (three) times daily.   Yes Historical Provider, MD  vitamin B-12 (CYANOCOBALAMIN) 1000 MCG tablet Take 1,000 mcg by mouth daily.   Yes Historical Provider, MD   BP 146/74 mmHg  Pulse 65  Temp(Src) 97.9 F (36.6 C) (Oral)  Resp 20  SpO2 98% Physical Exam  Constitutional: She appears well-developed  and well-nourished. No distress.  HENT:  Head: Normocephalic and atraumatic.  Eyes: Pupils are equal, round, and reactive to light.  Neck: Normal range of motion.  Cardiovascular: Normal rate and intact distal pulses.   Pulmonary/Chest: No respiratory distress.  Abdominal: Normal appearance. She exhibits no distension.  Musculoskeletal: Normal range of motion.  Neurological: She is alert. No cranial nerve deficit.  Skin: Skin is warm and dry. No rash noted.  Psychiatric: Her behavior is normal.  Nursing note and vitals reviewed.   ED Course  Procedures  DIAGNOSTIC STUDIES: Oxygen Saturation is 100% on RA, normal by my interpretation.     COORDINATION OF CARE: 6:53 PM - Discussed plans to order diagnostic studies and imaging. Pt advised of plan for treatment and pt agrees.  Labs Review Labs Reviewed  BASIC METABOLIC PANEL - Abnormal; Notable for the following:    Glucose, Bld 119 (*)    BUN 22 (*)    Creatinine, Ser 1.07 (*)    GFR calc non Af Amer 50 (*)    GFR calc Af Amer 58 (*)    All other components within normal limits  CBC WITH DIFFERENTIAL/PLATELET  TROPONIN I    Imaging Review Ct Head Wo Contrast  01/04/2016  CLINICAL DATA:  Near syncopal episode when getting out of a car in the parking lot. Headache earlier. History of dementia. EXAM: CT HEAD WITHOUT CONTRAST TECHNIQUE: Contiguous axial images were obtained from the base of the skull through the vertex without intravenous contrast. COMPARISON:  12/16/2014 FINDINGS: There is no evidence of acute cortical infarct, intracranial hemorrhage, mass, midline shift, or extra-axial fluid collection. Ventricles and sulci are within normal limits for age. Patchy to confluent hypodensities throughout the cerebral white matter bilaterally have not significantly changed. Orbits are unremarkable. The visualized paranasal sinuses and mastoid air cells are clear. No acute osseous abnormality is seen. Calcified atherosclerosis is noted at the skullbase. IMPRESSION: 1. No evidence of acute intracranial abnormality. 2. Extensive cerebral white matter disease, unchanged and nonspecific though most often seen in the setting of chronic small vessel ischemia. Electronically Signed   By: [name] M.D.   On: 01/04/2016 19:22   I have personally reviewed and evaluated these images and lab results as part of my medical decision-making.   EKG Interpretation   Date/Time:  Monday January 04 2016 18:39:18 EDT Ventricular Rate:  65 PR Interval:  145 QRS Duration: 80 QT Interval:  393 QTC Calculation: 409 R Axis:   57 Text Interpretation:  Sinus rhythm Atrial premature complex Abnormal   R-wave progression, early transition No significant change since last  tracing Confirmed by Melissa Eichenberger  MD, Melissa Jarvis ([id number]) on 01/04/2016 6:59:52 PM      MDM   Final diagnoses:  Dehydration    I personally performed the services described in this documentation, which was scribed in my presence. The recorded information has been reviewed and considered.   Melissa Nay, MD 01/04/16 2044

## 2016-01-04 NOTE — ED Notes (Signed)
MD at bedside. 

## 2016-01-04 NOTE — ED Notes (Addendum)
Per daughter pt was seated in the car-pt got out of the car and was staggering in parking lot-she caught pt before she hit the ground-pt c/o CP now then later denies pain-states had HA earlier-slow steady gait to triage-NAD-alert with hx of dementia per daughter

## 2016-01-04 NOTE — Discharge Instructions (Signed)

## 2016-03-25 DIAGNOSIS — Z136 Encounter for screening for cardiovascular disorders: Secondary | ICD-10-CM | POA: Diagnosis not present

## 2016-03-25 DIAGNOSIS — Z131 Encounter for screening for diabetes mellitus: Secondary | ICD-10-CM | POA: Diagnosis not present

## 2016-03-25 DIAGNOSIS — Z5181 Encounter for therapeutic drug level monitoring: Secondary | ICD-10-CM | POA: Diagnosis not present

## 2016-03-25 DIAGNOSIS — Z0000 Encounter for general adult medical examination without abnormal findings: Secondary | ICD-10-CM | POA: Diagnosis not present

## 2016-03-25 DIAGNOSIS — Z7689 Persons encountering health services in other specified circumstances: Secondary | ICD-10-CM | POA: Diagnosis not present

## 2016-03-25 DIAGNOSIS — Z1383 Encounter for screening for respiratory disorder NEC: Secondary | ICD-10-CM | POA: Diagnosis not present

## 2016-03-25 DIAGNOSIS — M545 Low back pain: Secondary | ICD-10-CM | POA: Diagnosis not present

## 2016-04-20 ENCOUNTER — Emergency Department (HOSPITAL_BASED_OUTPATIENT_CLINIC_OR_DEPARTMENT_OTHER): Payer: Medicare Other

## 2016-04-20 ENCOUNTER — Emergency Department (HOSPITAL_BASED_OUTPATIENT_CLINIC_OR_DEPARTMENT_OTHER)
Admission: EM | Admit: 2016-04-20 | Discharge: 2016-04-20 | Disposition: A | Discharge status: Home / Self Care | Payer: Medicare Other | Attending: Emergency Medicine | Admitting: Emergency Medicine

## 2016-04-20 ENCOUNTER — Encounter (HOSPITAL_BASED_OUTPATIENT_CLINIC_OR_DEPARTMENT_OTHER): Payer: Self-pay | Admitting: *Deleted

## 2016-04-20 DIAGNOSIS — S0990XA Unspecified injury of head, initial encounter: Secondary | ICD-10-CM | POA: Diagnosis present

## 2016-04-20 DIAGNOSIS — F329 Major depressive disorder, single episode, unspecified: Secondary | ICD-10-CM | POA: Insufficient documentation

## 2016-04-20 DIAGNOSIS — W01198A Fall on same level from slipping, tripping and stumbling with subsequent striking against other object, initial encounter: Secondary | ICD-10-CM | POA: Diagnosis not present

## 2016-04-20 DIAGNOSIS — S0101XA Laceration without foreign body of scalp, initial encounter: Secondary | ICD-10-CM | POA: Diagnosis not present

## 2016-04-20 DIAGNOSIS — E785 Hyperlipidemia, unspecified: Secondary | ICD-10-CM | POA: Insufficient documentation

## 2016-04-20 DIAGNOSIS — Y999 Unspecified external cause status: Secondary | ICD-10-CM | POA: Insufficient documentation

## 2016-04-20 DIAGNOSIS — I10 Essential (primary) hypertension: Secondary | ICD-10-CM | POA: Diagnosis not present

## 2016-04-20 DIAGNOSIS — Y939 Activity, unspecified: Secondary | ICD-10-CM | POA: Diagnosis not present

## 2016-04-20 DIAGNOSIS — Y929 Unspecified place or not applicable: Secondary | ICD-10-CM | POA: Insufficient documentation

## 2016-04-20 DIAGNOSIS — Z87891 Personal history of nicotine dependence: Secondary | ICD-10-CM | POA: Diagnosis not present

## 2016-04-20 DIAGNOSIS — W19XXXA Unspecified fall, initial encounter: Secondary | ICD-10-CM

## 2016-04-20 MED ORDER — LIDOCAINE-EPINEPHRINE 2 %-1:100000 IJ SOLN
20.0000 mL | Freq: Once | INTRAMUSCULAR | Status: AC
  Administered 2016-04-20: 20 mL via INTRADERMAL
  Filled 2016-04-20: qty 1

## 2016-04-20 NOTE — ED Notes (Signed)
Pt fell and struck her head on the right side resulting in a bleeding head lac.  Bleeding controlled at this time.  Pt fell due to sticky glue on subfloor which caused her to get stuck and trip and fall.  No LOC, pt is a&O at baseline.  No bloodthinners.   Last tetanus shot was within the last year.

## 2016-04-20 NOTE — ED Notes (Signed)
Pt feet got stuck on sticky floor causing her to fall,  About 1 inch lac to rt side of head  Bleeding controlled,  No loc

## 2016-04-20 NOTE — Discharge Instructions (Signed)
Laceration Care, Adult °A laceration is a cut that goes through all layers of the skin. The cut also goes into the tissue that is right under the skin. Some cuts heal on their own. Others need to be closed with stitches (sutures), staples, skin adhesive strips, or wound glue. Taking care of your cut lowers your risk of infection and helps your cut to heal better. °HOW TO TAKE CARE OF YOUR CUT °For stitches or staples: °· Keep the wound clean and dry. °· If you were given a bandage (dressing), you should change it at least one time per day or as told by your doctor. You should also change it if it gets wet or dirty. °· Keep the wound completely dry for the first 24 hours or as told by your doctor. After that time, you may take a shower or a bath. However, make sure that the wound is not soaked in water until after the stitches or staples have been removed. °· Clean the wound one time each day or as told by your doctor: °· Wash the wound with soap and water. °· Rinse the wound with water until all of the soap comes off. °· Pat the wound dry with a clean towel. Do not rub the wound. °· After you clean the wound, put a thin layer of antibiotic ointment on it as told by your doctor. This ointment: °· Helps to prevent infection. °· Keeps the bandage from sticking to the wound. °· Have your stitches or staples removed as told by your doctor. °If your doctor used skin adhesive strips:  °· Keep the wound clean and dry. °· If you were given a bandage, you should change it at least one time per day or as told by your doctor. You should also change it if it gets dirty or wet. °· Do not get the skin adhesive strips wet. You can take a shower or a bath, but be careful to keep the wound dry. °· If the wound gets wet, pat it dry with a clean towel. Do not rub the wound. °· Skin adhesive strips fall off on their own. You can trim the strips as the wound heals. Do not remove any strips that are still stuck to the wound. They will  fall off after a while. °If your doctor used wound glue: °· Try to keep your wound dry, but you may briefly wet it in the shower or bath. Do not soak the wound in water, such as by swimming. °· After you take a shower or a bath, gently pat the wound dry with a clean towel. Do not rub the wound. °· Do not do any activities that will make you really sweaty until the skin glue has fallen off on its own. °· Do not apply liquid, cream, or ointment medicine to your wound while the skin glue is still on. °· If you were given a bandage, you should change it at least one time per day or as told by your doctor. You should also change it if it gets dirty or wet. °· If a bandage is placed over the wound, do not let the tape for the bandage touch the skin glue. °· Do not pick at the glue. The skin glue usually stays on for 5-10 days. Then, it falls off of the skin. °General Instructions  °· To help prevent scarring, make sure to cover your wound with sunscreen whenever you are outside after stitches are removed, after adhesive strips are removed,   or when wound glue stays in place and the wound is healed. Make sure to wear a sunscreen of at least 30 SPF. °· Take over-the-counter and prescription medicines only as told by your doctor. °· If you were given antibiotic medicine or ointment, take or apply it as told by your doctor. Do not stop using the antibiotic even if your wound is getting better. °· Do not scratch or pick at the wound. °· Keep all follow-up visits as told by your doctor. This is important. °· Check your wound every day for signs of infection. Watch for: °· Redness, swelling, or pain. °· Fluid, blood, or pus. °· Raise (elevate) the injured area above the level of your heart while you are sitting or lying down, if possible. °GET HELP IF: °· You got a tetanus shot and you have any of these problems at the injection site: °· Swelling. °· Very bad pain. °· Redness. °· Bleeding. °· You have a fever. °· A wound that was  closed breaks open. °· You notice a bad smell coming from your wound or your bandage. °· You notice something coming out of the wound, such as wood or glass. °· Medicine does not help your pain. °· You have more redness, swelling, or pain at the site of your wound. °· You have fluid, blood, or pus coming from your wound. °· You notice a change in the color of your skin near your wound. °· You need to change the bandage often because fluid, blood, or pus is coming from the wound. °· You start to have a new rash. °· You start to have numbness around the wound. °GET HELP RIGHT AWAY IF: °· You have very bad swelling around the wound. °· Your pain suddenly gets worse and is very bad. °· You notice painful lumps near the wound or on skin that is anywhere on your body. °· You have a red streak going away from your wound. °· The wound is on your hand or foot and you cannot move a finger or toe like you usually can. °· The wound is on your hand or foot and you notice that your fingers or toes look pale or bluish. °  °This information is not intended to replace advice given to you by your health care provider. Make sure you discuss any questions you have with your health care provider. °  °Document Released: 03/21/2008 Document Revised: 02/17/2015 Document Reviewed: 09/29/2014 °Elsevier Interactive Patient Education ©2016 [employer] ° °Head Injury, Adult °You have a head injury. Headaches and throwing up (vomiting) are common after a head injury. It should be easy to wake up from sleeping. Sometimes you must stay in the hospital. Most problems happen within the first 24 hours. Side effects may occur up to 7-10 days after the injury.  °WHAT ARE THE TYPES OF HEAD INJURIES? °Head injuries can be as minor as a bump. Some head injuries can be more severe. More severe head injuries include: °· A jarring injury to the brain (concussion). °· A bruise of the brain (contusion). This mean there is bleeding in the brain that can cause  swelling. °· A cracked skull (skull fracture). °· Bleeding in the brain that collects, clots, and forms a bump (hematoma). °WHEN SHOULD I GET HELP RIGHT AWAY?  °· You are confused or sleepy. °· You cannot be woken up. °· You feel sick to your stomach (nauseous) or keep throwing up (vomiting). °· Your dizziness or unsteadiness is getting worse. °· You   have very bad, lasting headaches that are not helped by medicine. Take medicines only as told by your doctor. °· You cannot use your arms or legs like normal. °· You cannot walk. °· You notice changes in the black spots in the center of the colored part of your eye (pupil). °· You have clear or bloody fluid coming from your nose or ears. °· You have trouble seeing. °During the next 24 hours after the injury, you must stay with someone who can watch you. This person should get help right away (call 911 in the [place] if you start to shake and are not able to control it (have seizures), you pass out, or you are unable to wake up. °HOW CAN I PREVENT A HEAD INJURY IN THE FUTURE? °· Wear seat belts. °· Wear a helmet while bike riding and playing sports like football. °· Stay away from dangerous activities around the house. °WHEN CAN I RETURN TO NORMAL ACTIVITIES AND ATHLETICS? °See your doctor before doing these activities. You should not do normal activities or play contact sports until 1 week after the following symptoms have stopped: °· Headache that does not go away. °· Dizziness. °· Poor attention. °· Confusion. °· Memory problems. °· Sickness to your stomach or throwing up. °· Tiredness. °· Fussiness. °· Bothered by bright lights or loud noises. °· Anxiousness or depression. °· Restless sleep. °MAKE SURE YOU:  °· Understand these instructions. °· Will watch your condition. °· Will get help right away if you are not doing well or get worse. °  °This information is not intended to replace advice given to you by your health care provider. Make sure you discuss any questions  you have with your health care provider. °  °Document Released: 09/15/2008 Document Revised: 10/24/2014 Document Reviewed: 06/10/2013 °Elsevier Interactive Patient Education ©2016 [employer] Inc[employer] ° °

## 2016-04-20 NOTE — ED Provider Notes (Signed)
CSN: [id number]     Arrival date & time 04/20/16  1903 History  By signing my name below, I, Soijett Blue, attest that this documentation has been prepared under the direction and in the presence of Jacalyn Lefevre, MD. Electronically Signed: Soijett Blue, ED Scribe. 04/20/2016. 7:34 PM.   Chief Complaint  Patient presents with  . Fall  . Head Laceration      The history is provided by the patient and a relative. No language interpreter was used.    Melissa Jarvis is a 75 y.o. female with a medical hx of HTN, dementia, who presents to the Emergency Department complaining of a fall onset PTA. Pt notes that she tripped and fell due to sticky glue on sub-floor that caused her right foot to become stuck. Pt struck her head on the floor due to the fall. Pt last tetanus vaccination was last year. Pt has associated symptoms of right sided head laceration. She notes that she has tried applying pressure with no medications for the relief of her symptoms. She denies LOC, HA, and any other symptoms. Denies being on blood thinners. Pt is allergic to penicillin.    Past Medical History  Diagnosis Date  . Depressed   . Hypertension   . Hyperlipidemia   . Dementia   . Dementia    Past Surgical History  Procedure Laterality Date  . Abdominal hysterectomy    . Cholecystectomy     No family history on file. Social History  Substance Use Topics  . Smoking status: Former Games developer  . Smokeless tobacco: None  . Alcohol Use: No   OB History    No data available     Review of Systems  Constitutional: Negative for fever and chills.  Skin: Positive for wound (laceration to head).  Neurological: Negative for syncope.      Allergies  Penicillins  Home Medications   Prior to Admission medications   Medication Sig Start Date End Date Taking? Authorizing Provider  amLODipine-benazepril (LOTREL) 10-40 MG per capsule Take 1 capsule by mouth daily.    Historical Provider, MD  aspirin (ASPIRIN  EC) 81 MG EC tablet Take 81 mg by mouth daily. Swallow whole.    Historical Provider, MD  Calcium Carb-Cholecalciferol 600-800 MG-UNIT TABS Take 1 tablet by mouth every morning.    Historical Provider, MD  hydrALAZINE (APRESOLINE) 50 MG tablet Take 50 mg by mouth 2 (two) times daily.    Historical Provider, MD  Linaclotide Karlene Einstein) 145 MCG CAPS capsule Take 145 mcg by mouth daily.    Historical Provider, MD  memantine (NAMENDA XR) 28 MG CP24 24 hr capsule Take 28 mg by mouth at bedtime.    Historical Provider, MD  naproxen (NAPROSYN) 500 MG tablet Take 1 tablet (500 mg total) by mouth 2 (two) times daily. 11/11/13   Glynn Octave, MD  Omega-3 Fatty Acids (FISH OIL) 1200 MG CAPS Take 1 capsule by mouth every morning.    Historical Provider, MD  PARoxetine (PAXIL) 30 MG tablet Take 30 mg by mouth daily.    Historical Provider, MD  pravastatin (PRAVACHOL) 80 MG tablet Take 80 mg by mouth at bedtime.    Historical Provider, MD  rivastigmine (EXELON) 3 MG capsule Take 3 mg by mouth 3 (three) times daily.    Historical Provider, MD  vitamin B-12 (CYANOCOBALAMIN) 1000 MCG tablet Take 1,000 mcg by mouth daily.    Historical Provider, MD   BP 164/70 mmHg  Pulse 61  Temp(Src) 98.2 F (  36.8 C) (Oral)  Resp 16  Wt 225 lb (102.059 kg)  SpO2 100% Physical Exam  Constitutional: She is oriented to person, place, and time. She appears well-developed and well-nourished. No distress.  HENT:  Head: Normocephalic. Head is with laceration.  1 inch laceration to right parietal.   Eyes: EOM are normal.  Neck: Neck supple.  Cardiovascular: Normal rate.   Pulmonary/Chest: Effort normal. No respiratory distress.  Abdominal: She exhibits no distension.  Musculoskeletal: Normal range of motion.  Neurological: She is alert and oriented to person, place, and time.  Skin: Skin is warm and dry.  Psychiatric: She has a normal mood and affect. Her behavior is normal.  Nursing note and vitals reviewed.   ED Course   .Marland Kitchen.Laceration Repair Date/Time: 04/20/2016 8:10 PM Performed by: Jacalyn LefevreHAVILAND, Danijela Vessey Authorized by: Jacalyn LefevreHAVILAND, Leilene Diprima Consent: Verbal consent obtained. Risks and benefits: risks, benefits and alternatives were discussed Consent given by: patient Patient understanding: patient states understanding of the procedure being performed Patient consent: the patient's understanding of the procedure matches consent given Procedure consent: procedure consent matches procedure scheduled Relevant documents: relevant documents present and verified Test results: test results available and properly labeled Site marked: the operative site was marked Imaging studies: imaging studies available Required items: required blood products, implants, devices, and special equipment available Patient identity confirmed: verbally with patient Time out: Immediately prior to procedure a "time out" was called to verify the correct patient, procedure, equipment, support staff and site/side marked as required. Body area: head/neck Location details: scalp Laceration length: 4 cm Tendon involvement: none Nerve involvement: none Vascular damage: no Anesthesia: local infiltration Local anesthetic: lidocaine 2% with epinephrine Anesthetic total: 2 ml Patient sedated: no Preparation: Patient was prepped and draped in the usual sterile fashion. Irrigation solution: saline Irrigation method: syringe and tap Amount of cleaning: standard Debridement: none Degree of undermining: none Skin closure: staples Number of sutures: 3 Approximation: close Approximation difficulty: simple Dressing: antibiotic ointment   (including critical care time) DIAGNOSTIC STUDIES: Oxygen Saturation is 100% on RA, nl by my interpretation.    COORDINATION OF CARE: 7:34 PM Discussed treatment plan with pt at bedside which includes laceration repair, CT head, and pt agreed to plan.    Imaging Review Ct Head Wo Contrast  04/20/2016  CLINICAL  DATA:  Larey SeatFell tonight, striking the head with posterior scalp laceration. EXAM: CT HEAD WITHOUT CONTRAST TECHNIQUE: Contiguous axial images were obtained from the base of the skull through the vertex without intravenous contrast. COMPARISON:  01/04/2016.  12/16/2014. FINDINGS: The examination suffers from motion degradation on the last several images. Scalp injury is noted in the right parietal region. No evidence of underlying skull fracture or radiopaque foreign object. The brain shows generalized atrophy with chronic small-vessel ischemic changes throughout the white matter. No mass lesion, hemorrhage, hydrocephalus or extra-axial collection. No sign of acute infarction. No fluid in the sinuses, middle ears or mastoids. IMPRESSION: Right parietal scalp injury. No underlying skull fracture or intracranial injury. Motion artifact on the last 7 images at the vertex. Chronic atrophy and small-vessel ischemic changes of the white matter. Electronically Signed   By: Paulina FusiMark  Shogry M.D.   On: 04/20/2016 20:15   I have personally reviewed and evaluated these images as part of my medical decision-making.    MDM  Pt knows to return for worsening of sx.  Pt to return in 7-10 days for staple removal.  Final diagnoses:  Scalp laceration, initial encounter  Accidental fall    I personally  performed the services described in this documentation, which was scribed in my presence. The recorded information has been reviewed and is accurate.    Jacalyn LefevreJulie Lakesia Dahle, MD 04/20/16 2028

## 2016-04-30 ENCOUNTER — Encounter (HOSPITAL_BASED_OUTPATIENT_CLINIC_OR_DEPARTMENT_OTHER): Payer: Self-pay | Admitting: Emergency Medicine

## 2016-04-30 ENCOUNTER — Emergency Department (HOSPITAL_BASED_OUTPATIENT_CLINIC_OR_DEPARTMENT_OTHER)
Admission: EM | Admit: 2016-04-30 | Discharge: 2016-04-30 | Disposition: A | Discharge status: Home / Self Care | Payer: Medicare Other | Attending: Emergency Medicine | Admitting: Emergency Medicine

## 2016-04-30 DIAGNOSIS — Z7982 Long term (current) use of aspirin: Secondary | ICD-10-CM | POA: Insufficient documentation

## 2016-04-30 DIAGNOSIS — E785 Hyperlipidemia, unspecified: Secondary | ICD-10-CM | POA: Diagnosis not present

## 2016-04-30 DIAGNOSIS — F329 Major depressive disorder, single episode, unspecified: Secondary | ICD-10-CM | POA: Diagnosis not present

## 2016-04-30 DIAGNOSIS — Z4802 Encounter for removal of sutures: Secondary | ICD-10-CM | POA: Diagnosis not present

## 2016-04-30 DIAGNOSIS — Z87891 Personal history of nicotine dependence: Secondary | ICD-10-CM | POA: Diagnosis not present

## 2016-04-30 DIAGNOSIS — I10 Essential (primary) hypertension: Secondary | ICD-10-CM | POA: Diagnosis not present

## 2016-04-30 NOTE — Discharge Instructions (Signed)

## 2016-04-30 NOTE — ED Provider Notes (Signed)
CSN: [id number]     Arrival date & time 04/30/16  2050 History  By signing my name below, I, Evon Slack, attest that this documentation has been prepared under the direction and in the presence of Sharnae Winfree, MD. Electronically Signed: Evon Slack, ED Scribe. 04/30/2016. 11:10 PM.    Chief Complaint  Patient presents with  . Dizziness  . Suture / Staple Removal   Patient is a 75 y.o. female presenting with suture removal. The history is provided by the patient and a relative. No language interpreter was used.  Suture / Staple Removal This is a new problem. The current episode started more than 1 week ago. The problem occurs constantly. The problem has not changed since onset.Pertinent negatives include no chest pain, no abdominal pain, no headaches and no shortness of breath. Nothing aggravates the symptoms. Nothing relieves the symptoms. She has tried nothing for the symptoms. The treatment provided significant relief.   HPI Comments: Melissa Jarvis is a 75 y.o. female who presents to the Emergency Department for suture removal that she had placed 10 days prior. Pt states the sutures were placed in the right scalp after falling. Pt states that the wound has been well healing. Pt doesn't report fever, nausea or vomiting. No dizziness.  She is at her baseline    Past Medical History  Diagnosis Date  . Depressed   . Hypertension   . Hyperlipidemia   . Dementia   . Dementia    Past Surgical History  Procedure Laterality Date  . Abdominal hysterectomy    . Cholecystectomy     History reviewed. No pertinent family history. Social History  Substance Use Topics  . Smoking status: Former Games developer  . Smokeless tobacco: None  . Alcohol Use: No   OB History    No data available     Review of Systems  Respiratory: Negative for shortness of breath.   Cardiovascular: Negative for chest pain.  Gastrointestinal: Negative for abdominal pain.  Skin: Positive for wound.   Neurological: Negative for headaches.  All other systems reviewed and are negative.    Allergies  Penicillins  Home Medications   Prior to Admission medications   Medication Sig Start Date End Date Taking? Authorizing Provider  amLODipine-benazepril (LOTREL) 10-40 MG per capsule Take 1 capsule by mouth daily.    Historical Provider, MD  aspirin (ASPIRIN EC) 81 MG EC tablet Take 81 mg by mouth daily. Swallow whole.    Historical Provider, MD  Calcium Carb-Cholecalciferol 600-800 MG-UNIT TABS Take 1 tablet by mouth every morning.    Historical Provider, MD  hydrALAZINE (APRESOLINE) 50 MG tablet Take 50 mg by mouth 2 (two) times daily.    Historical Provider, MD  Linaclotide Karlene Einstein) 145 MCG CAPS capsule Take 145 mcg by mouth daily.    Historical Provider, MD  memantine (NAMENDA XR) 28 MG CP24 24 hr capsule Take 28 mg by mouth at bedtime.    Historical Provider, MD  naproxen (NAPROSYN) 500 MG tablet Take 1 tablet (500 mg total) by mouth 2 (two) times daily. 11/11/13   Glynn Octave, MD  Omega-3 Fatty Acids (FISH OIL) 1200 MG CAPS Take 1 capsule by mouth every morning.    Historical Provider, MD  PARoxetine (PAXIL) 30 MG tablet Take 30 mg by mouth daily.    Historical Provider, MD  pravastatin (PRAVACHOL) 80 MG tablet Take 80 mg by mouth at bedtime.    Historical Provider, MD  rivastigmine (EXELON) 3 MG capsule Take 3 mg  by mouth 3 (three) times daily.    Historical Provider, MD  vitamin B-12 (CYANOCOBALAMIN) 1000 MCG tablet Take 1,000 mcg by mouth daily.    Historical Provider, MD   BP 137/72 mmHg  Pulse 92  Temp(Src) 98.7 F (37.1 C) (Oral)  Resp 18  Ht 5\' 4"  (1.626 m)  Wt 225 lb (102.059 kg)  BMI 38.60 kg/m2  SpO2 100%   Physical Exam  Constitutional: She is oriented to person, place, and time. She appears well-developed and well-nourished. No distress.  HENT:  Head: Normocephalic and atraumatic.  Mouth/Throat: Oropharynx is clear and moist. No oropharyngeal exudate.  3   sutures slight right of occiput.    Eyes: Conjunctivae and EOM are normal. Pupils are equal, round, and reactive to light.  Neck: Normal range of motion. Neck supple. No tracheal deviation present.  Cardiovascular: Normal rate, regular rhythm and normal heart sounds.   Pulmonary/Chest: Effort normal and breath sounds normal. No respiratory distress. She has no wheezes. She has no rales.  Abdominal: Soft. Bowel sounds are normal. There is no rebound and no guarding.  Musculoskeletal: Normal range of motion.  Neurological: She is alert and oriented to person, place, and time.  Skin: Skin is warm and dry.  Psychiatric: She has a normal mood and affect. Her behavior is normal.  Nursing note and vitals reviewed.   ED Course  Procedures (including critical care time)  SUTURE REMOVAL Performed by: Thera Basden, MD Consent: Verbal consent obtained. Patient identity confirmed: provided demographic data Time out: Immediately prior to procedure a "time out" was called to verify the correct patient, procedure, equipment, support staff and site/side marked as required. Location: right occiput  Wound Appearance: clean Sutures/Staples Removed: 3 Patient tolerance: Patient tolerated the procedure well with no immediate complications.   DIAGNOSTIC STUDIES: Oxygen Saturation is 100% on RA, normal by my interpretation.    COORDINATION OF CARE: 11:09 PM-Discussed treatment plan which includes suture removal with pt at bedside and pt agreed to plan.    Labs Review Labs Reviewed - No data to display  Imaging Review No results found.    EKG Interpretation None      MDM   Final diagnoses:  None    Filed Vitals:   04/30/16 2058  BP: 137/72  Pulse: 92  Temp: 98.7 F (37.1 C)  Resp: 18   CDI staples removed,  No other concerns at this time.  Based on history and exam patient has been appropriately medically screened and emergency conditions excluded. Patient is stable for discharge  at this time. Follow up provided and strict return precautions given.  I personally performed the services described in this documentation, which was scribed in my presence. The recorded information has been reviewed and is accurate.        Cy BlamerApril Emmitt Matthews, MD 05/01/16 [id number]

## 2016-04-30 NOTE — ED Notes (Signed)
Patient has 3 staples in the right scalp. Patient here to have them removed

## 2016-08-25 DIAGNOSIS — Z780 Asymptomatic menopausal state: Secondary | ICD-10-CM | POA: Diagnosis not present

## 2016-08-25 DIAGNOSIS — G309 Alzheimer's disease, unspecified: Secondary | ICD-10-CM | POA: Diagnosis not present

## 2016-08-25 DIAGNOSIS — I10 Essential (primary) hypertension: Secondary | ICD-10-CM | POA: Diagnosis not present

## 2016-08-25 DIAGNOSIS — E782 Mixed hyperlipidemia: Secondary | ICD-10-CM | POA: Diagnosis not present

## 2016-08-25 DIAGNOSIS — E039 Hypothyroidism, unspecified: Secondary | ICD-10-CM | POA: Diagnosis not present

## 2016-08-25 DIAGNOSIS — R7303 Prediabetes: Secondary | ICD-10-CM | POA: Diagnosis not present

## 2016-08-25 DIAGNOSIS — Z0000 Encounter for general adult medical examination without abnormal findings: Secondary | ICD-10-CM | POA: Diagnosis not present

## 2016-08-25 DIAGNOSIS — R0602 Shortness of breath: Secondary | ICD-10-CM | POA: Diagnosis not present

## 2016-08-25 DIAGNOSIS — Z79899 Other long term (current) drug therapy: Secondary | ICD-10-CM | POA: Diagnosis not present

## 2016-09-06 DIAGNOSIS — G301 Alzheimer's disease with late onset: Secondary | ICD-10-CM | POA: Diagnosis not present

## 2016-09-13 DIAGNOSIS — E782 Mixed hyperlipidemia: Secondary | ICD-10-CM | POA: Diagnosis not present

## 2016-09-13 DIAGNOSIS — G309 Alzheimer's disease, unspecified: Secondary | ICD-10-CM | POA: Diagnosis not present

## 2016-09-13 DIAGNOSIS — Z1389 Encounter for screening for other disorder: Secondary | ICD-10-CM | POA: Diagnosis not present

## 2016-09-13 DIAGNOSIS — I10 Essential (primary) hypertension: Secondary | ICD-10-CM | POA: Diagnosis not present

## 2016-09-13 DIAGNOSIS — M8580 Other specified disorders of bone density and structure, unspecified site: Secondary | ICD-10-CM | POA: Diagnosis not present

## 2016-09-16 DIAGNOSIS — I10 Essential (primary) hypertension: Secondary | ICD-10-CM | POA: Diagnosis not present

## 2016-09-16 DIAGNOSIS — Z1389 Encounter for screening for other disorder: Secondary | ICD-10-CM | POA: Diagnosis not present

## 2016-09-22 DIAGNOSIS — Z79899 Other long term (current) drug therapy: Secondary | ICD-10-CM | POA: Diagnosis not present

## 2016-10-04 DIAGNOSIS — G301 Alzheimer's disease with late onset: Secondary | ICD-10-CM | POA: Diagnosis not present

## 2016-10-23 DIAGNOSIS — E785 Hyperlipidemia, unspecified: Secondary | ICD-10-CM | POA: Diagnosis not present

## 2016-10-23 DIAGNOSIS — S0101XA Laceration without foreign body of scalp, initial encounter: Secondary | ICD-10-CM | POA: Diagnosis not present

## 2016-10-23 DIAGNOSIS — Z880 Allergy status to penicillin: Secondary | ICD-10-CM | POA: Diagnosis not present

## 2016-10-23 DIAGNOSIS — S098XXA Other specified injuries of head, initial encounter: Secondary | ICD-10-CM | POA: Diagnosis not present

## 2016-10-23 DIAGNOSIS — S069X9A Unspecified intracranial injury with loss of consciousness of unspecified duration, initial encounter: Secondary | ICD-10-CM | POA: Diagnosis not present

## 2016-10-23 DIAGNOSIS — S0091XA Abrasion of unspecified part of head, initial encounter: Secondary | ICD-10-CM | POA: Diagnosis not present

## 2016-10-23 DIAGNOSIS — I10 Essential (primary) hypertension: Secondary | ICD-10-CM | POA: Diagnosis not present

## 2016-10-23 DIAGNOSIS — S199XXA Unspecified injury of neck, initial encounter: Secondary | ICD-10-CM | POA: Diagnosis not present

## 2016-10-23 DIAGNOSIS — Z8249 Family history of ischemic heart disease and other diseases of the circulatory system: Secondary | ICD-10-CM | POA: Diagnosis not present

## 2016-10-23 DIAGNOSIS — R55 Syncope and collapse: Secondary | ICD-10-CM | POA: Diagnosis not present

## 2016-11-05 DIAGNOSIS — S0190XA Unspecified open wound of unspecified part of head, initial encounter: Secondary | ICD-10-CM | POA: Diagnosis not present

## 2016-11-07 DIAGNOSIS — G309 Alzheimer's disease, unspecified: Secondary | ICD-10-CM | POA: Diagnosis not present

## 2016-11-07 DIAGNOSIS — I10 Essential (primary) hypertension: Secondary | ICD-10-CM | POA: Diagnosis not present

## 2016-11-07 DIAGNOSIS — M8580 Other specified disorders of bone density and structure, unspecified site: Secondary | ICD-10-CM | POA: Diagnosis not present

## 2016-11-07 DIAGNOSIS — E782 Mixed hyperlipidemia: Secondary | ICD-10-CM | POA: Diagnosis not present

## 2017-01-12 DIAGNOSIS — R5383 Other fatigue: Secondary | ICD-10-CM | POA: Diagnosis not present

## 2017-01-12 DIAGNOSIS — Z0000 Encounter for general adult medical examination without abnormal findings: Secondary | ICD-10-CM | POA: Diagnosis not present

## 2017-01-12 DIAGNOSIS — I10 Essential (primary) hypertension: Secondary | ICD-10-CM | POA: Diagnosis not present

## 2017-01-12 DIAGNOSIS — Z79899 Other long term (current) drug therapy: Secondary | ICD-10-CM | POA: Diagnosis not present

## 2017-01-12 DIAGNOSIS — E559 Vitamin D deficiency, unspecified: Secondary | ICD-10-CM | POA: Diagnosis not present

## 2017-01-12 DIAGNOSIS — R079 Chest pain, unspecified: Secondary | ICD-10-CM | POA: Diagnosis not present

## 2017-01-12 DIAGNOSIS — M47816 Spondylosis without myelopathy or radiculopathy, lumbar region: Secondary | ICD-10-CM | POA: Diagnosis not present

## 2017-01-12 DIAGNOSIS — E782 Mixed hyperlipidemia: Secondary | ICD-10-CM | POA: Diagnosis not present

## 2017-01-12 DIAGNOSIS — G309 Alzheimer's disease, unspecified: Secondary | ICD-10-CM | POA: Diagnosis not present

## 2017-01-19 DIAGNOSIS — R079 Chest pain, unspecified: Secondary | ICD-10-CM | POA: Diagnosis not present

## 2017-01-31 DIAGNOSIS — I10 Essential (primary) hypertension: Secondary | ICD-10-CM | POA: Diagnosis not present

## 2017-01-31 DIAGNOSIS — R079 Chest pain, unspecified: Secondary | ICD-10-CM | POA: Diagnosis not present

## 2017-01-31 DIAGNOSIS — M79606 Pain in leg, unspecified: Secondary | ICD-10-CM | POA: Diagnosis not present

## 2017-01-31 DIAGNOSIS — R0989 Other specified symptoms and signs involving the circulatory and respiratory systems: Secondary | ICD-10-CM | POA: Diagnosis not present

## 2017-02-01 DIAGNOSIS — R1312 Dysphagia, oropharyngeal phase: Secondary | ICD-10-CM | POA: Diagnosis not present

## 2017-02-01 DIAGNOSIS — M79606 Pain in leg, unspecified: Secondary | ICD-10-CM | POA: Diagnosis not present

## 2017-02-01 DIAGNOSIS — R51 Headache: Secondary | ICD-10-CM | POA: Diagnosis not present

## 2017-02-01 DIAGNOSIS — E785 Hyperlipidemia, unspecified: Secondary | ICD-10-CM | POA: Diagnosis not present

## 2017-02-01 DIAGNOSIS — I10 Essential (primary) hypertension: Secondary | ICD-10-CM | POA: Diagnosis not present

## 2017-02-01 DIAGNOSIS — Z880 Allergy status to penicillin: Secondary | ICD-10-CM | POA: Diagnosis not present

## 2017-02-01 DIAGNOSIS — G309 Alzheimer's disease, unspecified: Secondary | ICD-10-CM | POA: Diagnosis not present

## 2017-02-01 DIAGNOSIS — Z79899 Other long term (current) drug therapy: Secondary | ICD-10-CM | POA: Diagnosis not present

## 2017-02-01 DIAGNOSIS — M25519 Pain in unspecified shoulder: Secondary | ICD-10-CM | POA: Diagnosis not present

## 2017-02-01 DIAGNOSIS — I081 Rheumatic disorders of both mitral and tricuspid valves: Secondary | ICD-10-CM | POA: Diagnosis not present

## 2017-02-01 DIAGNOSIS — Z7982 Long term (current) use of aspirin: Secondary | ICD-10-CM | POA: Diagnosis not present

## 2017-02-01 DIAGNOSIS — R55 Syncope and collapse: Secondary | ICD-10-CM | POA: Diagnosis not present

## 2017-02-01 DIAGNOSIS — M79603 Pain in arm, unspecified: Secondary | ICD-10-CM | POA: Diagnosis not present

## 2017-02-01 LAB — POCT INR: INR: 1 (ref 0.9–1.1)

## 2017-02-02 DIAGNOSIS — R55 Syncope and collapse: Secondary | ICD-10-CM | POA: Diagnosis not present

## 2017-02-02 DIAGNOSIS — R51 Headache: Secondary | ICD-10-CM | POA: Diagnosis not present

## 2017-02-02 DIAGNOSIS — I10 Essential (primary) hypertension: Secondary | ICD-10-CM | POA: Diagnosis not present

## 2017-02-02 DIAGNOSIS — I517 Cardiomegaly: Secondary | ICD-10-CM | POA: Diagnosis not present

## 2017-02-02 DIAGNOSIS — I6523 Occlusion and stenosis of bilateral carotid arteries: Secondary | ICD-10-CM | POA: Diagnosis not present

## 2017-02-02 DIAGNOSIS — G301 Alzheimer's disease with late onset: Secondary | ICD-10-CM | POA: Diagnosis not present

## 2017-02-02 DIAGNOSIS — I361 Nonrheumatic tricuspid (valve) insufficiency: Secondary | ICD-10-CM | POA: Diagnosis not present

## 2017-02-02 DIAGNOSIS — R569 Unspecified convulsions: Secondary | ICD-10-CM | POA: Diagnosis not present

## 2017-02-21 DIAGNOSIS — Z79899 Other long term (current) drug therapy: Secondary | ICD-10-CM | POA: Diagnosis not present

## 2017-02-21 DIAGNOSIS — I10 Essential (primary) hypertension: Secondary | ICD-10-CM | POA: Diagnosis not present

## 2017-02-21 DIAGNOSIS — Z09 Encounter for follow-up examination after completed treatment for conditions other than malignant neoplasm: Secondary | ICD-10-CM | POA: Diagnosis not present

## 2017-02-21 DIAGNOSIS — Z87898 Personal history of other specified conditions: Secondary | ICD-10-CM | POA: Diagnosis not present

## 2017-04-20 DIAGNOSIS — M79606 Pain in leg, unspecified: Secondary | ICD-10-CM | POA: Diagnosis not present

## 2017-04-20 DIAGNOSIS — R0989 Other specified symptoms and signs involving the circulatory and respiratory systems: Secondary | ICD-10-CM | POA: Diagnosis not present

## 2017-04-24 DIAGNOSIS — I10 Essential (primary) hypertension: Secondary | ICD-10-CM | POA: Diagnosis not present

## 2017-04-24 DIAGNOSIS — G309 Alzheimer's disease, unspecified: Secondary | ICD-10-CM | POA: Diagnosis not present

## 2017-04-24 DIAGNOSIS — M47816 Spondylosis without myelopathy or radiculopathy, lumbar region: Secondary | ICD-10-CM | POA: Diagnosis not present

## 2017-04-24 DIAGNOSIS — E782 Mixed hyperlipidemia: Secondary | ICD-10-CM | POA: Diagnosis not present

## 2017-04-24 DIAGNOSIS — Z79899 Other long term (current) drug therapy: Secondary | ICD-10-CM | POA: Diagnosis not present

## 2017-06-05 DIAGNOSIS — Z1231 Encounter for screening mammogram for malignant neoplasm of breast: Secondary | ICD-10-CM | POA: Diagnosis not present

## 2017-07-13 DIAGNOSIS — G301 Alzheimer's disease with late onset: Secondary | ICD-10-CM | POA: Diagnosis not present

## 2017-07-13 DIAGNOSIS — G4489 Other headache syndrome: Secondary | ICD-10-CM | POA: Diagnosis not present

## 2017-07-24 ENCOUNTER — Emergency Department (HOSPITAL_BASED_OUTPATIENT_CLINIC_OR_DEPARTMENT_OTHER)
Admission: EM | Admit: 2017-07-24 | Discharge: 2017-07-24 | Disposition: A | Discharge status: Home / Self Care | Payer: Medicare Other | Attending: Emergency Medicine | Admitting: Emergency Medicine

## 2017-07-24 ENCOUNTER — Encounter (HOSPITAL_BASED_OUTPATIENT_CLINIC_OR_DEPARTMENT_OTHER): Payer: Self-pay | Admitting: Emergency Medicine

## 2017-07-24 DIAGNOSIS — I10 Essential (primary) hypertension: Secondary | ICD-10-CM | POA: Diagnosis not present

## 2017-07-24 DIAGNOSIS — K5900 Constipation, unspecified: Secondary | ICD-10-CM

## 2017-07-24 DIAGNOSIS — Z7982 Long term (current) use of aspirin: Secondary | ICD-10-CM | POA: Insufficient documentation

## 2017-07-24 DIAGNOSIS — F0390 Unspecified dementia without behavioral disturbance: Secondary | ICD-10-CM | POA: Diagnosis not present

## 2017-07-24 DIAGNOSIS — Z79899 Other long term (current) drug therapy: Secondary | ICD-10-CM | POA: Insufficient documentation

## 2017-07-24 DIAGNOSIS — R1030 Lower abdominal pain, unspecified: Secondary | ICD-10-CM | POA: Diagnosis not present

## 2017-07-24 DIAGNOSIS — R109 Unspecified abdominal pain: Secondary | ICD-10-CM | POA: Insufficient documentation

## 2017-07-24 DIAGNOSIS — Z87891 Personal history of nicotine dependence: Secondary | ICD-10-CM | POA: Diagnosis not present

## 2017-07-24 LAB — COMPREHENSIVE METABOLIC PANEL
ALBUMIN: 3.4 g/dL — AB (ref 3.5–5.0)
ALK PHOS: 81 U/L (ref 38–126)
ALT: 10 U/L — AB (ref 14–54)
ANION GAP: 6 (ref 5–15)
AST: 17 U/L (ref 15–41)
BUN: 9 mg/dL (ref 6–20)
CALCIUM: 8.8 mg/dL — AB (ref 8.9–10.3)
CO2: 30 mmol/L (ref 22–32)
Chloride: 105 mmol/L (ref 101–111)
Creatinine, Ser: 0.67 mg/dL (ref 0.44–1.00)
GFR calc Af Amer: >60 mL/min (ref >60)
GFR calc non Af Amer: >60 mL/min (ref >60)
GLUCOSE: 96 mg/dL (ref 65–99)
Potassium: 3.9 mmol/L (ref 3.5–5.1)
SODIUM: 141 mmol/L (ref 135–145)
Total Bilirubin: 0.7 mg/dL (ref 0.3–1.2)
Total Protein: 6.8 g/dL (ref 6.5–8.1)

## 2017-07-24 LAB — URINALYSIS, ROUTINE W REFLEX MICROSCOPIC
BILIRUBIN URINE: NEGATIVE
Glucose, UA: NEGATIVE mg/dL
Hgb urine dipstick: NEGATIVE
Ketones, ur: NEGATIVE mg/dL
LEUKOCYTES UA: NEGATIVE
NITRITE: NEGATIVE
Protein, ur: NEGATIVE mg/dL
SPECIFIC GRAVITY, URINE: 1.01 (ref 1.005–1.030)
pH: 7.5 (ref 5.0–8.0)

## 2017-07-24 LAB — CBC WITH DIFFERENTIAL/PLATELET
BASOS PCT: 1 %
Basophils Absolute: 0 10*3/uL (ref 0.0–0.1)
Eosinophils Absolute: 0.2 10*3/uL (ref 0.0–0.7)
Eosinophils Relative: 5 %
HEMATOCRIT: 39.1 % (ref 36.0–46.0)
Hemoglobin: 13.1 g/dL (ref 12.0–15.0)
Lymphocytes Relative: 36 %
Lymphs Abs: 1.4 10*3/uL (ref 0.7–4.0)
MCH: 32 pg (ref 26.0–34.0)
MCHC: 33.5 g/dL (ref 30.0–36.0)
MCV: 95.6 fL (ref 78.0–100.0)
MONO ABS: 0.3 10*3/uL (ref 0.1–1.0)
MONOS PCT: 8 %
NEUTROS ABS: 2 10*3/uL (ref 1.7–7.7)
Neutrophils Relative %: 50 %
Platelets: 153 10*3/uL (ref 150–400)
RBC: 4.09 MIL/uL (ref 3.87–5.11)
RDW: 12.3 % (ref 11.5–15.5)
WBC: 3.9 10*3/uL — ABNORMAL LOW (ref 4.0–10.5)

## 2017-07-24 LAB — I-STAT CG4 LACTIC ACID, ED: Lactic Acid, Venous: 0.84 mmol/L (ref 0.5–1.9)

## 2017-07-24 NOTE — ED Notes (Signed)
Attempted to start an IV on pt. Pt jerked and IV came out of the vein. Pressure dsg applied. Daughter wants numbing medication to numb the skin. Teaching done that we do not use numbing in the ED.

## 2017-07-24 NOTE — Discharge Instructions (Signed)
1. Take miralax 1 to 3 capfuls daily mixed in drink as needed for constipation. 2. Take colace 1 capsule daily. 3. Return to ER if any vomiting, fever, worsening pain, or sudden change in mentation.

## 2017-07-24 NOTE — ED Triage Notes (Addendum)
patient comes to triage smiling and denies any complaints. Family member is here with the patient and reports that for the last week the patient has been grabbing at her pelvic region and moaning in pain. Denies any N/V/D  -  PAtient states that she may have some pain with urination  - family member reports that the patient mental status has changed and seems to have worsening demenita with the pelvic pain

## 2017-07-25 NOTE — ED Provider Notes (Signed)
[facility] Provider Note   CSN: [id number] Arrival date & time: 07/24/17  1741     History   Chief Complaint Chief Complaint  Patient presents with  . Abdominal Pain    HPI Melissa Jarvis is a 76 y.o. female.  76yo F w/ PMH including HTN. HLD, dementia who p/w abdominal pain. Daughter reports that for approximately one week, the patient has intermittently seemed like she is having lower abdominal pain. She has rubbed her lower abdomen acting like it is hurting her. She has had no associated fevers, nausea, vomiting, or diarrhea. It is unclear whether she is having any urinary symptoms. Daughter does note an ongoing problem with constipation and occasionally straining to have a bowel movement. She has occasionally tried MiraLAX without significant relief. Patient has had no cough or cold symptoms. She has baseline of dementia and daughter has not noticed any severe changes in her mental status. She has been eating and drinking normally.  LEVEL 5 CAVEAT DUE TO DEMENTIA   The history is provided by a relative.  Abdominal Pain      Past Medical History:  Diagnosis Date  . Dementia   . Dementia   . Depressed   . Hyperlipidemia   . Hypertension     There are no active problems to display for this patient.   Past Surgical History:  Procedure Laterality Date  . ABDOMINAL HYSTERECTOMY    . CHOLECYSTECTOMY      OB History    No data available       Home Medications    Prior to Admission medications   Medication Sig Start Date End Date Taking? Authorizing Provider  amLODipine-benazepril (LOTREL) 10-40 MG per capsule Take 1 capsule by mouth daily.    [provider]  aspirin (ASPIRIN EC) 81 MG EC tablet Take 81 mg by mouth daily. Swallow whole.    [provider]  Calcium Carb-Cholecalciferol 600-800 MG-UNIT TABS Take 1 tablet by mouth every morning.    [provider]  hydrALAZINE (APRESOLINE) 50 MG tablet Take 50 mg by mouth  2 (two) times daily.    [provider]  Linaclotide Karlene Einstein) 145 MCG CAPS capsule Take 145 mcg by mouth daily.    [provider]  memantine (NAMENDA XR) 28 MG CP24 24 hr capsule Take 28 mg by mouth at bedtime.    [provider]  naproxen (NAPROSYN) 500 MG tablet Take 1 tablet (500 mg total) by mouth 2 (two) times daily. 11/11/13   Rancour, Jeannett Senior, MD  Omega-3 Fatty Acids (FISH OIL) 1200 MG CAPS Take 1 capsule by mouth every morning.    [provider]  PARoxetine (PAXIL) 30 MG tablet Take 30 mg by mouth daily.    [provider]  pravastatin (PRAVACHOL) 80 MG tablet Take 80 mg by mouth at bedtime.    [provider]  rivastigmine (EXELON) 3 MG capsule Take 3 mg by mouth 3 (three) times daily.    [provider]  vitamin B-12 (CYANOCOBALAMIN) 1000 MCG tablet Take 1,000 mcg by mouth daily.    [provider]    Family History History reviewed. No pertinent family history.  Social History Social History  Substance Use Topics  . Smoking status: Former Games developer  . Smokeless tobacco: Never Used  . Alcohol use No     Allergies   Penicillins   Review of Systems Review of Systems  Unable to perform ROS: Dementia  Gastrointestinal: Positive for abdominal pain.  Physical Exam Updated Vital Signs BP (!) 163/70   Pulse 66   Temp 98.6 F (37 C) (Oral)   Resp 12   Ht  (1.651 m)   Wt 102.1 kg (225 lb)   SpO2 97%   BMI 37.44 kg/m   Physical Exam  Constitutional: She appears well-developed and well-nourished. No distress.  HENT:  Head: Normocephalic and atraumatic.  Moist mucous membranes  Eyes: Pupils are equal, round, and reactive to light. Conjunctivae are normal.  Neck: Neck supple.  Cardiovascular: Normal rate, regular rhythm and normal heart sounds.   Pulmonary/Chest: Effort normal and breath sounds normal.  Abdominal: Soft. Bowel sounds are normal. She exhibits no distension and no mass.  There is no tenderness. No hernia.  Musculoskeletal: She exhibits no edema.  Neurological: She is alert.  Oriented to person, follows basic commands, answers yes/no questions, ambulatory independently  Skin: Skin is warm and dry.  Psychiatric:  Calm, cooperative  Nursing note and vitals reviewed.    ED Treatments / Results  Labs (all labs ordered are listed, but only abnormal results are displayed) Labs Reviewed  COMPREHENSIVE METABOLIC PANEL - Abnormal; Notable for the following:       Result Value   Calcium 8.8 (*)    Albumin 3.4 (*)    ALT 10 (*)    All other components within normal limits  CBC WITH DIFFERENTIAL/PLATELET - Abnormal; Notable for the following:    WBC 3.9 (*)    All other components within normal limits  URINALYSIS, ROUTINE W REFLEX MICROSCOPIC  I-STAT CG4 LACTIC ACID, ED    EKG  EKG Interpretation None       Radiology No results found.  Procedures Procedures (including critical care time)  Medications Ordered in ED Medications - No data to display   Initial Impression / Assessment and Plan / ED Course  I have reviewed the triage vital signs and the nursing notes.  Pertinent labs & imaging results that were available during my care of the patient were reviewed by me and considered in my medical decision making (see chart for details).    Pt w/ ?abdominal pain intermittently this week, no associated V/D or other symptoms. She was comfortable on exam, VS reassuring, afebrile. She had no abdominal distension or tenderness. UA negative for infection or dehydration. Obtained labs which were reassuring including normal lactate, WBC 3.9, normal H/H, normal creatinine. She has been tolerating PO without problems and no decrease in appetite. I considered imaging but she is non-tender and without pain here. I did recommend constipation treatment with daily colace and fiber with miralax titrated to effect. Extensively reviewed return precautions With  daughter who voiced understanding and patient was discharged in satisfactory condition.  Final Clinical Impressions(s) / ED Diagnoses   Final diagnoses:  Constipation, unspecified constipation type  Abdominal pain, unspecified abdominal location    New Prescriptions Discharge Medication List as of 07/24/2017 10:46 PM       Khyri Hinzman, Ambrose Finland, MD 07/25/17 1458

## 2017-09-04 DIAGNOSIS — E782 Mixed hyperlipidemia: Secondary | ICD-10-CM | POA: Diagnosis not present

## 2017-09-04 DIAGNOSIS — M47816 Spondylosis without myelopathy or radiculopathy, lumbar region: Secondary | ICD-10-CM | POA: Diagnosis not present

## 2017-09-04 DIAGNOSIS — I10 Essential (primary) hypertension: Secondary | ICD-10-CM | POA: Diagnosis not present

## 2017-09-04 DIAGNOSIS — Z1331 Encounter for screening for depression: Secondary | ICD-10-CM | POA: Diagnosis not present

## 2017-09-04 DIAGNOSIS — Z79899 Other long term (current) drug therapy: Secondary | ICD-10-CM | POA: Diagnosis not present

## 2017-09-04 DIAGNOSIS — Z1339 Encounter for screening examination for other mental health and behavioral disorders: Secondary | ICD-10-CM | POA: Diagnosis not present

## 2017-09-04 DIAGNOSIS — Z23 Encounter for immunization: Secondary | ICD-10-CM | POA: Diagnosis not present

## 2017-09-04 DIAGNOSIS — R0602 Shortness of breath: Secondary | ICD-10-CM | POA: Diagnosis not present

## 2017-09-04 DIAGNOSIS — G309 Alzheimer's disease, unspecified: Secondary | ICD-10-CM | POA: Diagnosis not present

## 2017-10-22 ENCOUNTER — Emergency Department (HOSPITAL_COMMUNITY): Payer: Medicare Other

## 2017-10-22 ENCOUNTER — Encounter (HOSPITAL_COMMUNITY): Payer: Self-pay | Admitting: *Deleted

## 2017-10-22 ENCOUNTER — Emergency Department (HOSPITAL_COMMUNITY)
Admission: EM | Admit: 2017-10-22 | Discharge: 2017-10-23 | Disposition: A | Discharge status: Home / Self Care | Payer: Medicare Other | Attending: Emergency Medicine | Admitting: Emergency Medicine

## 2017-10-22 DIAGNOSIS — Z7982 Long term (current) use of aspirin: Secondary | ICD-10-CM | POA: Diagnosis not present

## 2017-10-22 DIAGNOSIS — R638 Other symptoms and signs concerning food and fluid intake: Secondary | ICD-10-CM | POA: Insufficient documentation

## 2017-10-22 DIAGNOSIS — Z87891 Personal history of nicotine dependence: Secondary | ICD-10-CM | POA: Diagnosis not present

## 2017-10-22 DIAGNOSIS — Z7902 Long term (current) use of antithrombotics/antiplatelets: Secondary | ICD-10-CM | POA: Insufficient documentation

## 2017-10-22 DIAGNOSIS — R339 Retention of urine, unspecified: Secondary | ICD-10-CM | POA: Diagnosis present

## 2017-10-22 DIAGNOSIS — I10 Essential (primary) hypertension: Secondary | ICD-10-CM | POA: Diagnosis not present

## 2017-10-22 DIAGNOSIS — J9811 Atelectasis: Secondary | ICD-10-CM | POA: Diagnosis not present

## 2017-10-22 DIAGNOSIS — R5383 Other fatigue: Secondary | ICD-10-CM

## 2017-10-22 DIAGNOSIS — Z79899 Other long term (current) drug therapy: Secondary | ICD-10-CM | POA: Diagnosis not present

## 2017-10-22 LAB — I-STAT CG4 LACTIC ACID, ED
LACTIC ACID, VENOUS: 1.5 mmol/L (ref 0.5–1.9)
Lactic Acid, Venous: 2.58 mmol/L (ref 0.5–1.9)

## 2017-10-22 LAB — URINALYSIS, ROUTINE W REFLEX MICROSCOPIC
BILIRUBIN URINE: NEGATIVE
Glucose, UA: NEGATIVE mg/dL
HGB URINE DIPSTICK: NEGATIVE
KETONES UR: NEGATIVE mg/dL
Leukocytes, UA: NEGATIVE
NITRITE: NEGATIVE
Protein, ur: NEGATIVE mg/dL
SPECIFIC GRAVITY, URINE: 1.023 (ref 1.005–1.030)
pH: 5 (ref 5.0–8.0)

## 2017-10-22 LAB — COMPREHENSIVE METABOLIC PANEL
ALBUMIN: 3.6 g/dL (ref 3.5–5.0)
ALK PHOS: 87 U/L (ref 38–126)
ALT: 14 U/L (ref 14–54)
AST: 24 U/L (ref 15–41)
Anion gap: 13 (ref 5–15)
BUN: 10 mg/dL (ref 6–20)
CALCIUM: 9.2 mg/dL (ref 8.9–10.3)
CHLORIDE: 107 mmol/L (ref 101–111)
CO2: 25 mmol/L (ref 22–32)
CREATININE: 0.73 mg/dL (ref 0.44–1.00)
GFR calc non Af Amer: >60 mL/min (ref >60)
GLUCOSE: 85 mg/dL (ref 65–99)
Potassium: 3.3 mmol/L — ABNORMAL LOW (ref 3.5–5.1)
SODIUM: 145 mmol/L (ref 135–145)
Total Bilirubin: 1.2 mg/dL (ref 0.3–1.2)
Total Protein: 7.4 g/dL (ref 6.5–8.1)

## 2017-10-22 LAB — CBC WITH DIFFERENTIAL/PLATELET
BASOS PCT: 1 %
Basophils Absolute: 0 10*3/uL (ref 0.0–0.1)
EOS ABS: 0.1 10*3/uL (ref 0.0–0.7)
EOS PCT: 3 %
HCT: 48.4 % — ABNORMAL HIGH (ref 36.0–46.0)
HEMOGLOBIN: 16.4 g/dL — AB (ref 12.0–15.0)
Lymphocytes Relative: 34 %
Lymphs Abs: 1.3 10*3/uL (ref 0.7–4.0)
MCH: 32.5 pg (ref 26.0–34.0)
MCHC: 33.9 g/dL (ref 30.0–36.0)
MCV: 96 fL (ref 78.0–100.0)
MONO ABS: 0.3 10*3/uL (ref 0.1–1.0)
Monocytes Relative: 7 %
NEUTROS ABS: 2.2 10*3/uL (ref 1.7–7.7)
NEUTROS PCT: 55 %
Platelets: 120 10*3/uL — ABNORMAL LOW (ref 150–400)
RBC: 5.04 MIL/uL (ref 3.87–5.11)
RDW: 13.2 % (ref 11.5–15.5)
WBC: 3.9 10*3/uL — ABNORMAL LOW (ref 4.0–10.5)

## 2017-10-22 MED ORDER — SODIUM CHLORIDE 0.9 % IV BOLUS (SEPSIS)
1000.0000 mL | Freq: Once | INTRAVENOUS | Status: AC
  Administered 2017-10-22: 1000 mL via INTRAVENOUS

## 2017-10-22 NOTE — ED Notes (Signed)
Bladder scanner reading "0 mLs"; Scanned by this tech, as well as RCharity fundraiser

## 2017-10-22 NOTE — ED Triage Notes (Signed)
Pt brought here by daughter whom she lives with for increasing weakness over one month and decreased urine output and appetite.  Daughter states diapers are barely wet. Pt has hx of dementia and does not like to follow commands.

## 2017-10-22 NOTE — ED Provider Notes (Signed)
[facility] EMERGENCY DEPARTMENT Provider Note  CSN: [id number] Arrival date & time: 10/22/17 1741  Chief Complaint(s) Urinary Retention (decreased urine output) and Weakness  HPI Melissa Jarvis is a 77 y.o. female with a history of dementia who presents to the emergency department for 1 month of reported increased generalized fatigue with decreased oral hydration and appetite and also with decreased urine output.  Daughter denies any recent fevers or infections.  No recent trauma.  Did try to encourage the patient to eat and orally hydrate but can only get the patient to take in a little bit.  Remainder of history, ROS, and physical exam limited due to patient's condition (dementia). Additional information was obtained from family.   Level V Caveat.   HPI  Past Medical History Past Medical History:  Diagnosis Date  . Dementia   . Dementia   . Depressed   . Hyperlipidemia   . Hypertension    There are no active problems to display for this patient.  Home Medication(s) Prior to Admission medications   Medication Sig Start Date End Date Taking? Authorizing Provider  amLODipine-benazepril (LOTREL) 10-40 MG per capsule Take 1 capsule by mouth daily.    [provider]  aspirin (ASPIRIN EC) 81 MG EC tablet Take 81 mg by mouth daily. Swallow whole.    [provider]  Calcium Carb-Cholecalciferol 600-800 MG-UNIT TABS Take 1 tablet by mouth every morning.    [provider]  hydrALAZINE (APRESOLINE) 50 MG tablet Take 50 mg by mouth 2 (two) times daily.    [provider]  Linaclotide Karlene Einstein) 145 MCG CAPS capsule Take 145 mcg by mouth daily.    [provider]  memantine (NAMENDA XR) 28 MG CP24 24 hr capsule Take 28 mg by mouth at bedtime.    [provider]  naproxen (NAPROSYN) 500 MG tablet Take 1 tablet (500 mg total) by mouth 2 (two) times daily. 11/11/13   Rancour, Jeannett Senior, MD  Omega-3 Fatty Acids (FISH  OIL) 1200 MG CAPS Take 1 capsule by mouth every morning.    [provider]  PARoxetine (PAXIL) 30 MG tablet Take 30 mg by mouth daily.    [provider]  pravastatin (PRAVACHOL) 80 MG tablet Take 80 mg by mouth at bedtime.    [provider]  rivastigmine (EXELON) 3 MG capsule Take 3 mg by mouth 3 (three) times daily.    [provider]  vitamin B-12 (CYANOCOBALAMIN) 1000 MCG tablet Take 1,000 mcg by mouth daily.    [provider]                                                                                                                                    Past Surgical History Past Surgical History:  Procedure Laterality Date  . ABDOMINAL HYSTERECTOMY    . CHOLECYSTECTOMY     Family History No family history on file.  Social History Social History   Tobacco Use  . Smoking status: Former Games developer  . Smokeless tobacco: Never Used  Substance Use Topics  . Alcohol use: No  . Drug use: No   Allergies Penicillins  Review of Systems Review of Systems  Unable to perform ROS: Dementia  Endocrine: Positive for cold intolerance.    Physical Exam Vital Signs  I have reviewed the triage vital signs BP (!) 182/87 (BP Location: Right Arm)   Pulse 64   Temp 98.3 F (36.8 C) (Axillary)   Resp 16   Ht 5\' 2"  (1.575 m)   Wt 102.1 kg (225 lb)   SpO2 100%   BMI 41.15 kg/m   Physical Exam  Constitutional: She appears well-developed and well-nourished. No distress.  HENT:  Head: Normocephalic and atraumatic.  Nose: Nose normal.  Eyes: Conjunctivae and EOM are normal. Pupils are equal, round, and reactive to light. Right eye exhibits no discharge. Left eye exhibits no discharge. No scleral icterus.  Neck: Normal range of motion. Neck supple.  Cardiovascular: Normal rate and regular rhythm. Exam reveals no gallop and no friction rub.  No murmur heard. Pulmonary/Chest: Effort normal and breath sounds normal. No stridor. No respiratory  distress. She has no rales.  Abdominal: Soft. She exhibits no distension. There is no tenderness.  Musculoskeletal: She exhibits no edema or tenderness.  Neurological: She is alert. She is disoriented.  Moves all extremities with 5/5 strength throughout  Skin: Skin is warm and dry. No rash noted. She is not diaphoretic. No erythema.  Psychiatric: She has a normal mood and affect.  Vitals reviewed.   ED Results and Treatments Labs (all labs ordered are listed, but only abnormal results are displayed) Labs Reviewed  COMPREHENSIVE METABOLIC PANEL - Abnormal; Notable for the following components:      Result Value   Potassium 3.3 (*)    All other components within normal limits  CBC WITH DIFFERENTIAL/PLATELET - Abnormal; Notable for the following components:   WBC 3.9 (*)    Hemoglobin 16.4 (*)    HCT 48.4 (*)    Platelets 120 (*)    All other components within normal limits  URINALYSIS, ROUTINE W REFLEX MICROSCOPIC - Abnormal; Notable for the following components:   Color, Urine AMBER (*)    All other components within normal limits  I-STAT CG4 LACTIC ACID, ED - Abnormal; Notable for the following components:   Lactic Acid, Venous 2.58 (*)    All other components within normal limits  I-STAT CG4 LACTIC ACID, ED                                                                                                                         EKG  EKG Interpretation  Date/Time:    Ventricular Rate:    PR Interval:    QRS Duration:   QT Interval:    QTC Calculation:   R Axis:     Text Interpretation:  Radiology Dg Chest 2 View  Result Date: 10/22/2017 CLINICAL DATA:  Increased weakness over the past month with decreased urine output and appetite. EXAM: CHEST  2 VIEW COMPARISON:  02/02/2017 FINDINGS: Stable cardiomegaly with moderate thoracic aortic atherosclerosis. No pneumonic consolidation, effusion or pneumothorax. Atelectasis suggested on the lateral view in the right middle  lobe and/or lingular distribution though not clearly identified on the frontal projection. 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 Chronic stable degenerative change along the dorsal spine. IMPRESSION: 1. No active pulmonary disease. 2. Atelectasis suggested on the lateral view in the right middle lobe or lingular distributions. 3. Aortic atherosclerosis and stable cardiomegaly. Electronically Signed   By: Tollie Ethavid  Kwon M.D.   On: 10/22/2017 19:28   Pertinent labs & imaging results that were available during my care of the patient were reviewed by me and considered in my medical decision making (see chart for details).  Medications Ordered in ED Medications  sodium chloride 0.9 % bolus 1,000 mL (0 mLs Intravenous Stopped 10/23/17 0005)                                                                                                                                    Procedures Procedures  (including critical care time)  Medical Decision Making / ED Course I have reviewed the nursing notes for this encounter and the patient's prior records (if available in EHR or on provided paperwork).    Patient is afebrile with stable vital signs.  Labs at patient's baseline and grossly reassuring without evidence of renal insufficiency, urinary tract infection.  Patient had approximately 100 cc in the bladder on and out cath.  Provided with IV fluids and oral hydration.  Patient ambulated without complication and at baseline.   The patient is safe for discharge with strict return precautions.   Final Clinical Impression(s) / ED Diagnoses Final diagnoses:  Other fatigue  Decreased oral intake   Disposition: Discharge  Condition: Good  I have discussed the results, Dx and Tx plan with the patient's daughter who expressed understanding and agree(s) with the plan. Discharge instructions discussed at great length. The patient's daughter was given strict return precautions who verbalized  understanding of the instructions. No further questions at time of discharge.    ED Discharge Orders    None       Follow Up: Primary care provider  Schedule an appointment as soon as possible for a visit  As needed      This chart was dictated using voice recognition software.  Despite best efforts to proofread,  errors can occur which can change the documentation meaning.   Nira Connardama, Chastidy Ranker Eduardo, MD 10/23/17 [phone]

## 2017-10-22 NOTE — ED Notes (Signed)
Hannie EMT, Ryland RN bladder scanned pt X2, showed 0mL both times.

## 2017-10-23 NOTE — ED Notes (Signed)
Paged urology 

## 2017-10-23 NOTE — ED Notes (Signed)
Ambulated Pt in Room. Pt was able to get up and walk but only with Assistance from this tech and her daughter. Pt walked with a walker. Pt was steady while up. This tech and Pt's Family assisted getting Pt back in bed.

## 2017-11-02 DIAGNOSIS — G301 Alzheimer's disease with late onset: Secondary | ICD-10-CM | POA: Diagnosis not present

## 2017-11-02 DIAGNOSIS — G4452 New daily persistent headache (NDPH): Secondary | ICD-10-CM | POA: Diagnosis not present

## 2017-12-05 DIAGNOSIS — R627 Adult failure to thrive: Secondary | ICD-10-CM | POA: Diagnosis not present

## 2017-12-05 DIAGNOSIS — R0902 Hypoxemia: Secondary | ICD-10-CM | POA: Diagnosis not present

## 2017-12-05 DIAGNOSIS — R279 Unspecified lack of coordination: Secondary | ICD-10-CM | POA: Diagnosis not present

## 2017-12-05 DIAGNOSIS — G309 Alzheimer's disease, unspecified: Secondary | ICD-10-CM | POA: Diagnosis not present

## 2017-12-05 DIAGNOSIS — E876 Hypokalemia: Secondary | ICD-10-CM | POA: Diagnosis not present

## 2017-12-05 DIAGNOSIS — J189 Pneumonia, unspecified organism: Secondary | ICD-10-CM | POA: Diagnosis not present

## 2017-12-05 DIAGNOSIS — A419 Sepsis, unspecified organism: Secondary | ICD-10-CM | POA: Diagnosis not present

## 2017-12-05 DIAGNOSIS — E871 Hypo-osmolality and hyponatremia: Secondary | ICD-10-CM | POA: Diagnosis not present

## 2017-12-05 DIAGNOSIS — Z9181 History of falling: Secondary | ICD-10-CM | POA: Diagnosis not present

## 2017-12-05 DIAGNOSIS — R1312 Dysphagia, oropharyngeal phase: Secondary | ICD-10-CM | POA: Diagnosis not present

## 2017-12-05 DIAGNOSIS — R404 Transient alteration of awareness: Secondary | ICD-10-CM | POA: Diagnosis not present

## 2017-12-05 DIAGNOSIS — M6281 Muscle weakness (generalized): Secondary | ICD-10-CM | POA: Diagnosis not present

## 2017-12-05 DIAGNOSIS — E860 Dehydration: Secondary | ICD-10-CM | POA: Diagnosis not present

## 2017-12-05 DIAGNOSIS — G301 Alzheimer's disease with late onset: Secondary | ICD-10-CM | POA: Diagnosis not present

## 2017-12-05 DIAGNOSIS — J069 Acute upper respiratory infection, unspecified: Secondary | ICD-10-CM | POA: Diagnosis not present

## 2017-12-05 DIAGNOSIS — R531 Weakness: Secondary | ICD-10-CM | POA: Diagnosis not present

## 2017-12-05 DIAGNOSIS — I10 Essential (primary) hypertension: Secondary | ICD-10-CM | POA: Diagnosis not present

## 2017-12-05 DIAGNOSIS — E870 Hyperosmolality and hypernatremia: Secondary | ICD-10-CM | POA: Diagnosis not present

## 2017-12-05 DIAGNOSIS — R5381 Other malaise: Secondary | ICD-10-CM | POA: Diagnosis not present

## 2017-12-05 DIAGNOSIS — D696 Thrombocytopenia, unspecified: Secondary | ICD-10-CM | POA: Diagnosis not present

## 2017-12-05 DIAGNOSIS — Z87820 Personal history of traumatic brain injury: Secondary | ICD-10-CM | POA: Diagnosis not present

## 2017-12-05 DIAGNOSIS — R2689 Other abnormalities of gait and mobility: Secondary | ICD-10-CM | POA: Diagnosis not present

## 2017-12-05 DIAGNOSIS — R488 Other symbolic dysfunctions: Secondary | ICD-10-CM | POA: Diagnosis not present

## 2017-12-05 DIAGNOSIS — R05 Cough: Secondary | ICD-10-CM | POA: Diagnosis not present

## 2017-12-05 DIAGNOSIS — E785 Hyperlipidemia, unspecified: Secondary | ICD-10-CM | POA: Diagnosis not present

## 2017-12-05 DIAGNOSIS — I69398 Other sequelae of cerebral infarction: Secondary | ICD-10-CM | POA: Diagnosis not present

## 2017-12-05 DIAGNOSIS — Z515 Encounter for palliative care: Secondary | ICD-10-CM | POA: Diagnosis not present

## 2017-12-05 DIAGNOSIS — Z7982 Long term (current) use of aspirin: Secondary | ICD-10-CM | POA: Diagnosis not present

## 2017-12-05 DIAGNOSIS — R4020 Unspecified coma: Secondary | ICD-10-CM | POA: Diagnosis not present

## 2017-12-05 LAB — HEPATIC FUNCTION PANEL
ALT: 19 (ref 7–35)
AST: 21 (ref 13–35)
Alkaline Phosphatase: 101 (ref 25–125)
BILIRUBIN, TOTAL: 1.2

## 2017-12-12 LAB — CBC AND DIFFERENTIAL
HCT: 39 (ref 36–46)
HEMOGLOBIN: 13.3 (ref 12.0–16.0)
Platelets: 118 — AB (ref 150–399)
WBC: 7.6

## 2017-12-13 DIAGNOSIS — E785 Hyperlipidemia, unspecified: Secondary | ICD-10-CM | POA: Diagnosis not present

## 2017-12-13 DIAGNOSIS — E876 Hypokalemia: Secondary | ICD-10-CM | POA: Diagnosis not present

## 2017-12-13 DIAGNOSIS — E870 Hyperosmolality and hypernatremia: Secondary | ICD-10-CM | POA: Diagnosis not present

## 2017-12-13 DIAGNOSIS — G300 Alzheimer's disease with early onset: Secondary | ICD-10-CM | POA: Diagnosis not present

## 2017-12-13 DIAGNOSIS — M810 Age-related osteoporosis without current pathological fracture: Secondary | ICD-10-CM | POA: Diagnosis not present

## 2017-12-13 DIAGNOSIS — Z9181 History of falling: Secondary | ICD-10-CM | POA: Diagnosis not present

## 2017-12-13 DIAGNOSIS — R1312 Dysphagia, oropharyngeal phase: Secondary | ICD-10-CM | POA: Diagnosis not present

## 2017-12-13 DIAGNOSIS — M6281 Muscle weakness (generalized): Secondary | ICD-10-CM | POA: Diagnosis not present

## 2017-12-13 DIAGNOSIS — G301 Alzheimer's disease with late onset: Secondary | ICD-10-CM | POA: Diagnosis not present

## 2017-12-13 DIAGNOSIS — F482 Pseudobulbar affect: Secondary | ICD-10-CM | POA: Diagnosis not present

## 2017-12-13 DIAGNOSIS — I10 Essential (primary) hypertension: Secondary | ICD-10-CM | POA: Diagnosis not present

## 2017-12-13 DIAGNOSIS — R627 Adult failure to thrive: Secondary | ICD-10-CM | POA: Diagnosis not present

## 2017-12-13 DIAGNOSIS — I69398 Other sequelae of cerebral infarction: Secondary | ICD-10-CM | POA: Diagnosis not present

## 2017-12-13 DIAGNOSIS — R488 Other symbolic dysfunctions: Secondary | ICD-10-CM | POA: Diagnosis not present

## 2017-12-13 DIAGNOSIS — R279 Unspecified lack of coordination: Secondary | ICD-10-CM | POA: Diagnosis not present

## 2017-12-13 LAB — BASIC METABOLIC PANEL
BUN: 6 (ref 4–21)
Creatinine: 0.6 (ref 0.5–1.1)
Potassium: 4 (ref 3.4–5.3)
SODIUM: 140 (ref 137–147)

## 2017-12-14 ENCOUNTER — Non-Acute Institutional Stay (SKILLED_NURSING_FACILITY): Payer: Medicare Other | Admitting: Internal Medicine

## 2017-12-14 ENCOUNTER — Encounter: Payer: Self-pay | Admitting: Internal Medicine

## 2017-12-14 DIAGNOSIS — E870 Hyperosmolality and hypernatremia: Secondary | ICD-10-CM | POA: Diagnosis not present

## 2017-12-14 DIAGNOSIS — F482 Pseudobulbar affect: Secondary | ICD-10-CM | POA: Diagnosis not present

## 2017-12-14 DIAGNOSIS — M810 Age-related osteoporosis without current pathological fracture: Secondary | ICD-10-CM

## 2017-12-14 DIAGNOSIS — R627 Adult failure to thrive: Secondary | ICD-10-CM

## 2017-12-14 DIAGNOSIS — E876 Hypokalemia: Secondary | ICD-10-CM

## 2017-12-14 DIAGNOSIS — G300 Alzheimer's disease with early onset: Secondary | ICD-10-CM

## 2017-12-14 DIAGNOSIS — I10 Essential (primary) hypertension: Secondary | ICD-10-CM

## 2017-12-14 DIAGNOSIS — F329 Major depressive disorder, single episode, unspecified: Secondary | ICD-10-CM

## 2017-12-14 DIAGNOSIS — F0280 Dementia in other diseases classified elsewhere without behavioral disturbance: Secondary | ICD-10-CM | POA: Diagnosis not present

## 2017-12-14 DIAGNOSIS — E785 Hyperlipidemia, unspecified: Secondary | ICD-10-CM | POA: Diagnosis not present

## 2017-12-14 DIAGNOSIS — E538 Deficiency of other specified B group vitamins: Secondary | ICD-10-CM

## 2017-12-14 DIAGNOSIS — F32A Depression, unspecified: Secondary | ICD-10-CM

## 2017-12-14 NOTE — Progress Notes (Signed)
: Provider:  [name]., MD Location:  [facility] Room Number: 109-P Place of Service:  SNF (31)  PCP: Patient, No Pcp Per Patient Care Team: Patient, No Pcp Per as PCP - General (General Practice)  Extended Emergency Contact Information Primary Emergency Contact: [name],[name] Address: [address]          [place], [place] [address] [place] Home Phone: [phone] Relation: Daughter     Allergies: Penicillins  Chief Complaint  Patient presents with  . New Admit To SNF    New admission to [facility] SNF    HPI: Patient is 77 y.o. female with history of hypertension, hyperlipidemia, and Alzheimer's dementia, who was brought to the [facility] ED for failure to thrive. Patient's daughter who gives history reports the patient 3 months ago was able to ambulate but in the past month there is metastatic declining her feeding status and activity of daily living. Daughter states the patient is been refusing medication not consuming food or water becoming more more lethargic over the past days. Per daughter patient had no complaint chest pain, shortness of breath, exertional dyspnea, abdominal discomfort, dysuria, diarrhea, cough cold, just a little runny nose. Vital signs were stable but labs showed a sodium of 162 BUN of 21. Patient was admitted to [facility] from 2/19-27 hypernatremia with hypotonic IV fluids and hypokalemia with replacement. Patient also has thrombocytopenia with discharge platelets of 118. Patient is admitted to skilled nursing facility for OT/PT, possibly residential care. While at skilled nursing facility patient will be followed for hypertension treated with hydralazine, PBA treated with neudexta hyperlipidemia treated with Pravachol and omega-3 fatty acids.    Past Medical History:  Diagnosis Date  . Dementia   . Dementia   . Depressed   . Hyperlipidemia   . Hypertension      Past Surgical History:  Procedure Laterality Date  . ABDOMINAL HYSTERECTOMY    . CHOLECYSTECTOMY      Allergies as of 12/14/2017      Reactions   Penicillins Other (See Comments)   Other reaction(s): UNKNOWN REACTION Other reaction(s): UNKNOWN REACTION      Medication List        Accurate as of 12/14/17  2:50 PM. Always use your most recent med list.          amLODipine 10 MG tablet Commonly known as:  NORVASC Take 10 mg by mouth daily.   aspirin EC 81 MG EC tablet Generic drug:  aspirin Take 81 mg by mouth daily. Swallow whole.   Calcium Carb-Cholecalciferol 600-800 MG-UNIT Tabs Take 1 tablet by mouth every morning.   calcium carbonate 1250 (500 Ca) MG chewable tablet Commonly known as:  OS-CAL Chew 1 tablet by mouth daily.   D3-1000 1000 units tablet Generic drug:  Cholecalciferol Take 1,000 Units by mouth daily.   Fish Oil 1200 MG Caps Take 1 capsule by mouth every morning.   hydrALAZINE 50 MG tablet Commonly known as:  APRESOLINE Take 50 mg by mouth 2 (two) times daily.   NAMENDA XR 28 MG Cp24 24 hr capsule Generic drug:  memantine Take 28 mg by mouth at bedtime.   NUEDEXTA 20-10 MG Caps Generic drug:  Dextromethorphan-quiNIDine Take 1 capsule by mouth.   PARoxetine 30 MG tablet Commonly known as:  PAXIL Take 30 mg by mouth daily.   pravastatin 40 MG tablet Commonly known as:  PRAVACHOL Take 40 mg by  mouth daily.   rivastigmine 6 MG capsule Commonly known as:  EXELON Take 6 mg by mouth 2 (two) times daily.   vitamin B-12 1000 MCG tablet Commonly known as:  CYANOCOBALAMIN Take 1,000 mcg by mouth daily.       No orders of the defined types were placed in this encounter.   Immunization History  Administered Date(s) Administered  . Influenza, High Dose Seasonal PF 08/01/2016  . Pneumococcal Conjugate-13 08/01/2016  . Tdap 12/16/2014    Social History   Tobacco Use  . Smoking status: Former Games developer  . Smokeless tobacco: Never  Used  Substance Use Topics  . Alcohol use: No    Family history is hypertension in mother      Review of Systems  unable to obtain secondary to dementia; patient did not speak or respond to me at all    Vitals:   12/14/17 1406  BP: 127/64  Pulse: 87  Resp: 18  Temp: 99 F (37.2 C)  SpO2: 97%    SpO2 Readings from Last 1 Encounters:  12/14/17 97%   Body mass index is 30.02 kg/m.     Physical Exam  GENERAL APPEARANCE: Alert, non-conversant,  No acute distress.  SKIN: No diaphoresis rash HEAD: Normocephalic, atraumatic  EYES: Conjunctiva/lids clear. Pupils round, reactive. EOMs intact.  EARS: External exam WNL, canals clear. Hearing grossly normal.  NOSE: No deformity or discharge.  MOUTH/THROAT: Lips w/o lesions  RESPIRATORY: Breathing is even, unlabored. Lung sounds are clear   CARDIOVASCULAR: Heart RRR no murmurs, rubs or gallops. No peripheral edema.   GASTROINTESTINAL: Abdomen is soft, non-tender, not distended w/ normal bowel sounds. GENITOURINARY: Bladder non tender, not distended  MUSCULOSKELETAL: No abnormal joints or musculature NEUROLOGIC:  Cranial nerves 2-12 grossly intact presumed because patient did not move or speak during my examination PSYCHIATRIC: Severe dementia, no behavioral issues  There are no active problems to display for this patient.     Labs reviewed: Basic Metabolic Panel:    Component Value Date/Time   NA 140 12/13/2017   K 4.0 12/13/2017   CL 107 10/22/2017 1827   CO2 25 10/22/2017 1827   GLUCOSE 85 10/22/2017 1827   BUN 6 12/13/2017   CREATININE 0.6 12/13/2017   CREATININE 0.73 10/22/2017 1827   CALCIUM 9.2 10/22/2017 1827   PROT 7.4 10/22/2017 1827   ALBUMIN 3.6 10/22/2017 1827   AST 21 12/05/2017   ALT 19 12/05/2017   ALKPHOS 101 12/05/2017   BILITOT 1.2 10/22/2017 1827   GFRNONAA >60 10/22/2017 1827   GFRAA >60 10/22/2017 1827    Recent Labs    07/24/17 2059 10/22/17 1827 12/13/17  NA 141 145 140  K 3.9  3.3* 4.0  CL 105 107  --   CO2 30 25  --   GLUCOSE 96 85  --   BUN 9 10 6   CREATININE 0.67 0.73 0.6  CALCIUM 8.8* 9.2  --    Liver Function Tests: Recent Labs    07/24/17 2059 10/22/17 1827 12/05/17  AST 17 24 21   ALT 10* 14 19  ALKPHOS 81 87 101  BILITOT 0.7 1.2  --   PROT 6.8 7.4  --   ALBUMIN 3.4* 3.6  --    No results for input(s): LIPASE, AMYLASE in the last 8760 hours. No results for input(s): AMMONIA in the last 8760 hours. CBC: Recent Labs    07/24/17 2059 10/22/17 1827 12/12/17  WBC 3.9* 3.9* 7.6  NEUTROABS 2.0 2.2  --  HGB 13.1 16.4* 13.3  HCT 39.1 48.4* 39  MCV 95.6 96.0  --   PLT 153 120* 118*   Lipid No results for input(s): CHOL, HDL, LDLCALC, TRIG in the last 8760 hours.  Cardiac Enzymes: No results for input(s): CKTOTAL, CKMB, CKMBINDEX, TROPONINI in the last 8760 hours. BNP: No results for input(s): BNP in the last 8760 hours. No results found for: MICROALBUR No results found for: HGBA1C No results found for: TSH No results found for: VITAMINB12 No results found for: FOLATE No results found for: IRON, TIBC, FERRITIN  Imaging and Procedures obtained prior to SNF admission: Dg Chest 2 View  Result Date: 10/22/2017 CLINICAL DATA:  Increased weakness over the past month with decreased urine output and appetite. EXAM: CHEST  2 VIEW COMPARISON:  02/02/2017 FINDINGS: Stable cardiomegaly with moderate thoracic aortic atherosclerosis. No pneumonic consolidation, effusion or pneumothorax. Atelectasis suggested on the lateral view in the right middle lobe and/or lingular distribution though not clearly identified on the frontal projection. 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 Chronic stable degenerative change along the dorsal spine. IMPRESSION: 1. No active pulmonary disease. 2. Atelectasis suggested on the lateral view in the right middle lobe or lingular distributions. 3. Aortic atherosclerosis and stable cardiomegaly. Electronically  Signed   By: Tollie Ethavid  Kwon M.D.   On: 10/22/2017 19:28     Not all labs, radiology exams or other studies done during hospitalization come through on my EPIC note; however they are reviewed by me.    Assessment and Plan  Progressive dementia/failure to thrive/hypernatremia/hypokalemia- patient's sodium of 162 and BUN/creatinine were reportedly 21/1.2 which is almost impossible to remove based on the sodium; however patient improved to his sodium of 140 with IV fluids and potassium was repleted; all this is due to patient's very poor by mouth intake over the past month SNF - admitted for residential care; continue Namenda X are 28 one by mouth daily and Exelon 6 mg twice a day  Hypertension SNF - controlled; continue Norvasc 10 mg by mouth daily, and hydralazine 50 mg twice a day, ACE was discontinued  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; continue neudexta 20-10 one by mouth daily   Hyperlipidemia SNF - not stated as uncontrolled; continue Pravachol 40 mg by mouth daily and omega-3 thousand milligrams by mouth daily  Osteoporosis SNF - patient was discharged on Os-Cal 500 and vitamin D 1000 both daily but she was already on calcium carbonate and vitamin D 600 800 daily; will DC the calcium carbonate 500 and vitamin D 1000 and continue with the combination medicine the patient was originally on  Vitamin B12 deficiency SNF - continue thousand micrograms by mouth daily  Depression SNF - continue Paxil 30 mg by mouth daily   Time spent greater than 45 minutes ;> 50% of time with patient was spent reviewing records, labs, tests and studies, counseling and developing plan of care  Thurston Holenne D. Lyn HollingsheadAlexander, MD

## 2017-12-16 ENCOUNTER — Encounter: Payer: Self-pay | Admitting: Internal Medicine

## 2017-12-16 DIAGNOSIS — F329 Major depressive disorder, single episode, unspecified: Secondary | ICD-10-CM | POA: Insufficient documentation

## 2017-12-16 DIAGNOSIS — F482 Pseudobulbar affect: Secondary | ICD-10-CM | POA: Insufficient documentation

## 2017-12-16 DIAGNOSIS — F32A Depression, unspecified: Secondary | ICD-10-CM | POA: Insufficient documentation

## 2017-12-16 DIAGNOSIS — F0390 Unspecified dementia without behavioral disturbance: Secondary | ICD-10-CM | POA: Insufficient documentation

## 2017-12-16 DIAGNOSIS — E870 Hyperosmolality and hypernatremia: Secondary | ICD-10-CM | POA: Insufficient documentation

## 2017-12-16 DIAGNOSIS — I10 Essential (primary) hypertension: Secondary | ICD-10-CM | POA: Insufficient documentation

## 2017-12-16 DIAGNOSIS — E785 Hyperlipidemia, unspecified: Secondary | ICD-10-CM | POA: Insufficient documentation

## 2017-12-16 DIAGNOSIS — E876 Hypokalemia: Secondary | ICD-10-CM | POA: Insufficient documentation

## 2017-12-16 DIAGNOSIS — M810 Age-related osteoporosis without current pathological fracture: Secondary | ICD-10-CM | POA: Insufficient documentation

## 2017-12-16 DIAGNOSIS — F03C0 Unspecified dementia, severe, without behavioral disturbance, psychotic disturbance, mood disturbance, and anxiety: Secondary | ICD-10-CM | POA: Insufficient documentation

## 2017-12-16 DIAGNOSIS — E538 Deficiency of other specified B group vitamins: Secondary | ICD-10-CM | POA: Insufficient documentation

## 2017-12-16 DIAGNOSIS — R627 Adult failure to thrive: Secondary | ICD-10-CM | POA: Insufficient documentation

## 2017-12-19 LAB — CBC AND DIFFERENTIAL
HCT: 36 (ref 36–46)
HEMOGLOBIN: 12.2 (ref 12.0–16.0)
Platelets: 189 (ref 150–399)
WBC: 4.5

## 2017-12-19 LAB — BASIC METABOLIC PANEL
BUN: 15 (ref 4–21)
Creatinine: 0.6 (ref 0.5–1.1)
Glucose: 87
POTASSIUM: 3.9 (ref 3.4–5.3)
SODIUM: 145 (ref 137–147)

## 2018-01-09 ENCOUNTER — Non-Acute Institutional Stay (SKILLED_NURSING_FACILITY): Payer: Medicare Other | Admitting: Internal Medicine

## 2018-01-09 ENCOUNTER — Encounter: Payer: Self-pay | Admitting: Internal Medicine

## 2018-01-09 DIAGNOSIS — F482 Pseudobulbar affect: Secondary | ICD-10-CM

## 2018-01-09 DIAGNOSIS — M810 Age-related osteoporosis without current pathological fracture: Secondary | ICD-10-CM | POA: Diagnosis not present

## 2018-01-09 DIAGNOSIS — I10 Essential (primary) hypertension: Secondary | ICD-10-CM

## 2018-01-09 NOTE — Progress Notes (Signed)
Location:  [facility] Room Number: 109P Place of Service:  SNF (31)  Randon Goldsmithnne D. Lyn HollingsheadAlexander, MD  Patient Care Team: Patient, No Pcp Per as PCP - General (General Practice)  Extended Emergency Contact Information Primary Emergency Contact: [name],[name] Address: [address]          [place], [place] [address] [place] Home Phone: [phone] Relation: Daughter    Allergies: Penicillins  Chief Complaint  Patient presents with  . Medical Management of Chronic Issues    Routine Visit    HPI: Patient is 77 y.o. female who is being seen for routine issues of CVA, osteoporosis, and hypertension.  Past Medical History:  Diagnosis Date  . Alzheimer's disease   . Dementia   . Depressed   . Hyperlipidemia   . Hypertension     Past Surgical History:  Procedure Laterality Date  . ABDOMINAL HYSTERECTOMY    . CHOLECYSTECTOMY    . KNEE SURGERY     total    Allergies as of 01/09/2018      Reactions   Penicillins Other (See Comments)   Other reaction(s): UNKNOWN REACTION Other reaction(s): UNKNOWN REACTION      Medication List        Accurate as of 01/09/18 11:59 PM. Always use your most recent med list.          amLODipine 10 MG tablet Commonly known as:  NORVASC Take 10 mg by mouth daily.   aspirin EC 81 MG EC tablet Generic drug:  aspirin Take 81 mg by mouth daily. Swallow whole.   Calcium Carb-Cholecalciferol 600-800 MG-UNIT Tabs Take 1 tablet by mouth every morning.   calcium carbonate 1250 (500 Ca) MG chewable tablet Commonly known as:  OS-CAL Chew 1 tablet by mouth daily.   D3-1000 1000 units tablet Generic drug:  Cholecalciferol Take 1,000 Units by mouth daily.   Fish Oil 1200 MG Caps Take 1 capsule by mouth every morning.   hydrALAZINE 50 MG tablet Commonly known as:  APRESOLINE Take 50 mg by mouth 2 (two) times daily.   NAMENDA XR 28 MG Cp24 24 hr capsule Generic drug:  memantine Take 28 mg by  mouth at bedtime.   NUEDEXTA 20-10 MG Caps Generic drug:  Dextromethorphan-quiNIDine Take 1 capsule by mouth.   NUTRITIONAL SUPPLEMENTS PO Initiate 120 ml Medpass 3 times daily with meals related to weight loss   PARoxetine 30 MG tablet Commonly known as:  PAXIL Take 30 mg by mouth daily.   pravastatin 40 MG tablet Commonly known as:  PRAVACHOL Take 40 mg by mouth daily.   rivastigmine 6 MG capsule Commonly known as:  EXELON Take 6 mg by mouth 2 (two) times daily.   vitamin B-12 1000 MCG tablet Commonly known as:  CYANOCOBALAMIN Take 1,000 mcg by mouth daily.       No orders of the defined types were placed in this encounter.   Immunization History  Administered Date(s) Administered  . Influenza, High Dose Seasonal PF 08/01/2016  . Pneumococcal Conjugate-13 08/01/2016  . Tdap 12/16/2014    Social History   Tobacco Use  . Smoking status: Former Games developermoker  . Smokeless tobacco: Never Used  Substance Use Topics  . Alcohol use: No    Review of Systems unable to obtain secondary to dementia; nursing-no concerns     Vitals:   01/09/18 1305  BP: 131/72  Pulse: 69  Resp: 20  Temp: 97.6 F (36.4 C)  SpO2: 96%  Body mass index is 26.99 kg/m. Physical Exam  GENERAL APPEARANCE: Alert, nonconversant conversant, No acute distress  SKIN: No diaphoresis rash HEENT: Unremarkable RESPIRATORY: Breathing is even, unlabored. Lung sounds are clear   CARDIOVASCULAR: Heart RRR no murmurs, rubs or gallops. No peripheral edema  GASTROINTESTINAL: Abdomen is soft, non-tender, not distended w/ normal bowel sounds.  GENITOURINARY: Bladder non tender, not distended  MUSCULOSKELETAL: No abnormal joints or musculature NEUROLOGIC: Cranial nerves 2-12 grossly intact mood: Patient did not move during our encounter PSYCHIATRIC: Severe dementia, no behavioral issues  Patient Active Problem List   Diagnosis Date Noted  . Dementia 12/16/2017  . Failure to thrive in adult  12/16/2017  . Hypernatremia 12/16/2017  . Hypokalemia 12/16/2017  . Hypertension 12/16/2017  . 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 Hyperlipidemia 12/16/2017  . Osteoporosis 12/16/2017  . Vitamin B12 deficiency 12/16/2017  . Depression 12/16/2017    CMP     Component Value Date/Time   NA 145 12/19/2017   K 3.9 12/19/2017   CL 107 10/22/2017 1827   CO2 25 10/22/2017 1827   GLUCOSE 85 10/22/2017 1827   BUN 15 12/19/2017   CREATININE 0.6 12/19/2017   CREATININE 0.73 10/22/2017 1827   CALCIUM 9.2 10/22/2017 1827   PROT 7.4 10/22/2017 1827   ALBUMIN 3.6 10/22/2017 1827   AST 21 12/05/2017   ALT 19 12/05/2017   ALKPHOS 101 12/05/2017   BILITOT 1.2 10/22/2017 1827   GFRNONAA >60 10/22/2017 1827   GFRAA >60 10/22/2017 1827   Recent Labs    07/24/17 2059 10/22/17 1827 12/13/17 12/19/17  NA 141 145 140 145  K 3.9 3.3* 4.0 3.9  CL 105 107  --   --   CO2 30 25  --   --   GLUCOSE 96 85  --   --   BUN 9 10 6 15   CREATININE 0.67 0.73 0.6 0.6  CALCIUM 8.8* 9.2  --   --    Recent Labs    07/24/17 2059 10/22/17 1827 12/05/17  AST 17 24 21   ALT 10* 14 19  ALKPHOS 81 87 101  BILITOT 0.7 1.2  --   PROT 6.8 7.4  --   ALBUMIN 3.4* 3.6  --    Recent Labs    07/24/17 2059 10/22/17 1827 12/12/17 12/19/17  WBC 3.9* 3.9* 7.6 4.5  NEUTROABS 2.0 2.2  --   --   HGB 13.1 16.4* 13.3 12.2  HCT 39.1 48.4* 39 36  MCV 95.6 96.0  --   --   PLT 153 120* 118* 189   No results for input(s): CHOL, LDLCALC, TRIG in the last 8760 hours.  Invalid input(s): HCL No results found for: MICROALBUR No results found for: TSH No results found for: HGBA1C No results found for: CHOL, HDL, LDLCALC, LDLDIRECT, TRIG, CHOLHDL  Significant Diagnostic Results in last 30 days:  No results found.  Assessment and Plan  PBA (pseudobulbar affect) No reported problems; continue Nuedexta 20-10 1 p.o. daily  Osteoporosis Chronic and stable; continue calcium carbonate 600/801 p.o. daily, vitamin D  1000 units daily and Os-Cal 1 chewable daily  Hypertension Controlled; continue Norvasc 10 mg daily and hydralazine 50 mg twice daily    Augusta Hilbert D. Lyn Hollingshead, MD

## 2018-01-13 ENCOUNTER — Encounter: Payer: Self-pay | Admitting: Internal Medicine

## 2018-01-13 NOTE — Assessment & Plan Note (Signed)
Controlled; continue Norvasc 10 mg daily and hydralazine 50 mg twice daily

## 2018-01-13 NOTE — Assessment & Plan Note (Signed)
Chronic and stable; continue calcium carbonate 600/801 p.o. daily, vitamin D 1000 units daily and Os-Cal 1 chewable daily

## 2018-01-13 NOTE — Assessment & Plan Note (Signed)
No reported problems; continue Nuedexta 20-10 1 p.o. daily

## 2018-02-08 ENCOUNTER — Non-Acute Institutional Stay (SKILLED_NURSING_FACILITY): Payer: Medicare Other | Admitting: Adult Health

## 2018-02-08 ENCOUNTER — Encounter: Payer: Self-pay | Admitting: Adult Health

## 2018-02-08 DIAGNOSIS — F0280 Dementia in other diseases classified elsewhere without behavioral disturbance: Secondary | ICD-10-CM

## 2018-02-08 DIAGNOSIS — I10 Essential (primary) hypertension: Secondary | ICD-10-CM

## 2018-02-08 DIAGNOSIS — K5909 Other constipation: Secondary | ICD-10-CM | POA: Diagnosis not present

## 2018-02-08 DIAGNOSIS — F482 Pseudobulbar affect: Secondary | ICD-10-CM

## 2018-02-08 DIAGNOSIS — G300 Alzheimer's disease with early onset: Secondary | ICD-10-CM | POA: Diagnosis not present

## 2018-02-08 DIAGNOSIS — E785 Hyperlipidemia, unspecified: Secondary | ICD-10-CM | POA: Diagnosis not present

## 2018-02-08 NOTE — Progress Notes (Signed)
Location:   [facility] Room Number: 58 D Place of Service:  SNF (31)   CODE STATUS: Full Code  Allergies  Allergen Reactions  . Penicillins Other (See Comments)    Other reaction(s): UNKNOWN REACTION Other reaction(s): UNKNOWN REACTION     Chief Complaint  Patient presents with  . Medical Management of Chronic Issues    Hypertension; dementia; hyperlipidemia pba    HPI:  She is a 77 year old long term resident of this facility being seen for the management of her chronic illnesses: hypertension; dementia; hyperlipidemia; pba. She is unable to participate in the hpi or ros. There are no reports of changes in appetite; no behavioral issues; or uncontrolled pain. There are no nursing concerns at this time.    Past Medical History:  Diagnosis Date  . Alzheimer's disease   . Dementia   . Depressed   . Hyperlipidemia   . Hypertension     Past Surgical History:  Procedure Laterality Date  . ABDOMINAL HYSTERECTOMY    . CHOLECYSTECTOMY    . KNEE SURGERY     total    Social History   Socioeconomic History  . Marital status: Widowed    Spouse name: Not on file  . Number of children: Not on file  . Years of education: Not on file  . Highest education level: Not on file  Occupational History  . Not on file  Social Needs  . Financial resource strain: Not on file  . Food insecurity:    Worry: Not on file    Inability: Not on file  . Transportation needs:    Medical: Not on file    Non-medical: Not on file  Tobacco Use  . Smoking status: Former Games developer  . Smokeless tobacco: Never Used  Substance and Sexual Activity  . Alcohol use: No  . Drug use: No  . Sexual activity: Not on file  Lifestyle  . Physical activity:    Days per week: Not on file    Minutes per session: Not on file  . Stress: Not on file  Relationships  . Social connections:    Talks on phone: Not on file    Gets together: Not on file    Attends religious  service: Not on file    Active member of club or organization: Not on file    Attends meetings of clubs or organizations: Not on file    Relationship status: Not on file  . Intimate partner violence:    Fear of current or ex partner: Not on file    Emotionally abused: Not on file    Physically abused: Not on file    Forced sexual activity: Not on file  Other Topics Concern  . Not on file  Social History Narrative   Lives with daughter.  Daughter needs help to provide care.     High risk for readmission due to dementia and falls.      Family History  Problem Relation Age of Onset  . Hypertension Mother       VITAL SIGNS BP 112/68   Pulse 72   Temp 99.9 F (37.7 C)   Resp 20   Ht 5\' 6"  (1.676 m)   Wt 173 lb 12.8 oz (78.8 kg)   BMI 28.05 kg/m   Outpatient Encounter Medications as of 02/08/2018  Medication Sig  . amLODipine (NORVASC) 10 MG tablet Take 10 mg by mouth daily.  Marland Kitchen aspirin (ASPIRIN EC) 81  MG EC tablet Take 81 mg by mouth daily. Swallow whole.   . Calcium Carb-Cholecalciferol 600-800 MG-UNIT TABS Take 1 tablet by mouth every morning.  . calcium carbonate (OS-CAL) 1250 (500 Ca) MG chewable tablet Chew 1 tablet by mouth daily.  . Cholecalciferol (D3-1000) 1000 units tablet Take 1,000 Units by mouth daily.  Marland Kitchen. Dextromethorphan-quiNIDine (NUEDEXTA) 20-10 MG CAPS Take 1 capsule by mouth.  . hydrALAZINE (APRESOLINE) 50 MG tablet Take 50 mg by mouth 2 (two) times daily.  . memantine (NAMENDA XR) 28 MG CP24 24 hr capsule Take 28 mg by mouth at bedtime.   Marland Kitchen. NUTRITIONAL SUPPLEMENTS PO Initiate 120 ml Medpass 3 times daily with meals related to weight loss  . Omega-3 Fatty Acids (FISH OIL) 1000 MG CAPS Take 1 capsule by mouth every morning.  Marland Kitchen. PARoxetine (PAXIL) 30 MG tablet Take 30 mg by mouth daily.   . polyethylene glycol (MIRALAX / GLYCOLAX) packet Take 17 g by mouth daily.  . pravastatin (PRAVACHOL) 40 MG tablet Take 40 mg by mouth daily.  . rivastigmine (EXELON) 6 MG  capsule Take 6 mg by mouth 2 (two) times daily.   . vitamin B-12 (CYANOCOBALAMIN) 1000 MCG tablet Take 1,000 mcg by mouth daily.   No facility-administered encounter medications on file as of 02/08/2018.      SIGNIFICANT DIAGNOSTIC EXAMS   LABS REVIEWED:   12-19-17: wbc 4.5; hgb 12.2; hct 36; plt 189; glucose 87; bun 15; creat 0.6; k+ 3.9; na++ 145  Review of Systems  Unable to perform ROS: Dementia (unable to participate )    Physical Exam  Constitutional: No distress.  Neck: No thyromegaly present.  Cardiovascular: Normal rate, regular rhythm and normal heart sounds.  Pulmonary/Chest: Effort normal and breath sounds normal. No respiratory distress.  Abdominal: Soft. Bowel sounds are normal. She exhibits no distension. There is no tenderness.  Musculoskeletal: She exhibits no edema.  Able to move extremities   Lymphadenopathy:    She has no cervical adenopathy.  Neurological:  Is aware   Skin: Skin is warm and dry. She is not diaphoretic.      ASSESSMENT/ PLAN:  TODAY:   1. Essential hypertension: stable b/p 112/68: will continue norvasc 10 mg daily asa 81 mg daily apresoline 50 mg twice daily   2. Early onset alzheimer's dementia without behavioral disturbance: is without change: weight 173 pounds; will continue exelon 6 mg twice daily namenda xr 28 mg daily   3. Hyperlipidemia: stable will continue pravachol 40 mg daily  4. PBA: stable will continue nuedexta daily   5. Chronic constipation: stable will continue miralax daily      MD is aware of resident's narcotic use and is in agreement with current plan of care. We will attempt to wean resident as apropriate   Synthia Innocenteborah Relda Agosto NP [facility]  Contact [phone] Monday through Friday 8am- 5pm  After hours call [phone]

## 2018-02-09 DIAGNOSIS — K5909 Other constipation: Secondary | ICD-10-CM | POA: Insufficient documentation

## 2018-02-11 ENCOUNTER — Emergency Department (HOSPITAL_COMMUNITY): Payer: Medicare Other

## 2018-02-11 ENCOUNTER — Encounter (HOSPITAL_COMMUNITY): Payer: Self-pay

## 2018-02-11 ENCOUNTER — Inpatient Hospital Stay (HOSPITAL_COMMUNITY)
Admission: EM | Admit: 2018-02-11 | Discharge: 2018-02-14 | DRG: 100 | Disposition: A | Discharge status: Skilled Nursing Facility | Payer: Medicare Other | Attending: Neurology | Admitting: Neurology

## 2018-02-11 ENCOUNTER — Other Ambulatory Visit: Payer: Self-pay

## 2018-02-11 DIAGNOSIS — E538 Deficiency of other specified B group vitamins: Secondary | ICD-10-CM | POA: Diagnosis present

## 2018-02-11 DIAGNOSIS — R319 Hematuria, unspecified: Secondary | ICD-10-CM | POA: Diagnosis not present

## 2018-02-11 DIAGNOSIS — K5909 Other constipation: Secondary | ICD-10-CM | POA: Diagnosis not present

## 2018-02-11 DIAGNOSIS — E785 Hyperlipidemia, unspecified: Secondary | ICD-10-CM | POA: Diagnosis present

## 2018-02-11 DIAGNOSIS — Z880 Allergy status to penicillin: Secondary | ICD-10-CM | POA: Diagnosis not present

## 2018-02-11 DIAGNOSIS — Z7401 Bed confinement status: Secondary | ICD-10-CM | POA: Diagnosis not present

## 2018-02-11 DIAGNOSIS — F0281 Dementia in other diseases classified elsewhere with behavioral disturbance: Secondary | ICD-10-CM | POA: Diagnosis present

## 2018-02-11 DIAGNOSIS — R1312 Dysphagia, oropharyngeal phase: Secondary | ICD-10-CM | POA: Diagnosis not present

## 2018-02-11 DIAGNOSIS — E878 Other disorders of electrolyte and fluid balance, not elsewhere classified: Secondary | ICD-10-CM | POA: Diagnosis not present

## 2018-02-11 DIAGNOSIS — Z79899 Other long term (current) drug therapy: Secondary | ICD-10-CM

## 2018-02-11 DIAGNOSIS — R402113 Coma scale, eyes open, never, at hospital admission: Secondary | ICD-10-CM | POA: Diagnosis not present

## 2018-02-11 DIAGNOSIS — Z7982 Long term (current) use of aspirin: Secondary | ICD-10-CM | POA: Diagnosis not present

## 2018-02-11 DIAGNOSIS — F0390 Unspecified dementia without behavioral disturbance: Secondary | ICD-10-CM | POA: Diagnosis not present

## 2018-02-11 DIAGNOSIS — I10 Essential (primary) hypertension: Secondary | ICD-10-CM | POA: Diagnosis present

## 2018-02-11 DIAGNOSIS — R1110 Vomiting, unspecified: Secondary | ICD-10-CM | POA: Diagnosis not present

## 2018-02-11 DIAGNOSIS — M810 Age-related osteoporosis without current pathological fracture: Secondary | ICD-10-CM | POA: Diagnosis present

## 2018-02-11 DIAGNOSIS — Z9183 Wandering in diseases classified elsewhere: Secondary | ICD-10-CM | POA: Diagnosis not present

## 2018-02-11 DIAGNOSIS — Z888 Allergy status to other drugs, medicaments and biological substances status: Secondary | ICD-10-CM | POA: Diagnosis not present

## 2018-02-11 DIAGNOSIS — R402213 Coma scale, best verbal response, none, at hospital admission: Secondary | ICD-10-CM | POA: Diagnosis not present

## 2018-02-11 DIAGNOSIS — R402441 Other coma, without documented Glasgow coma scale score, or with partial score reported, in the field [EMT or ambulance]: Secondary | ICD-10-CM | POA: Diagnosis not present

## 2018-02-11 DIAGNOSIS — E872 Acidosis: Secondary | ICD-10-CM | POA: Diagnosis present

## 2018-02-11 DIAGNOSIS — Z87891 Personal history of nicotine dependence: Secondary | ICD-10-CM | POA: Diagnosis not present

## 2018-02-11 DIAGNOSIS — G309 Alzheimer's disease, unspecified: Secondary | ICD-10-CM | POA: Diagnosis not present

## 2018-02-11 DIAGNOSIS — R29810 Facial weakness: Secondary | ICD-10-CM | POA: Diagnosis not present

## 2018-02-11 DIAGNOSIS — E870 Hyperosmolality and hypernatremia: Secondary | ICD-10-CM | POA: Diagnosis not present

## 2018-02-11 DIAGNOSIS — R402313 Coma scale, best motor response, none, at hospital admission: Secondary | ICD-10-CM | POA: Diagnosis present

## 2018-02-11 DIAGNOSIS — R569 Unspecified convulsions: Principal | ICD-10-CM

## 2018-02-11 DIAGNOSIS — N390 Urinary tract infection, site not specified: Secondary | ICD-10-CM | POA: Diagnosis present

## 2018-02-11 DIAGNOSIS — R488 Other symbolic dysfunctions: Secondary | ICD-10-CM | POA: Diagnosis not present

## 2018-02-11 DIAGNOSIS — F329 Major depressive disorder, single episode, unspecified: Secondary | ICD-10-CM | POA: Diagnosis present

## 2018-02-11 DIAGNOSIS — Z8673 Personal history of transient ischemic attack (TIA), and cerebral infarction without residual deficits: Secondary | ICD-10-CM | POA: Diagnosis not present

## 2018-02-11 DIAGNOSIS — R4182 Altered mental status, unspecified: Secondary | ICD-10-CM | POA: Diagnosis not present

## 2018-02-11 DIAGNOSIS — M6281 Muscle weakness (generalized): Secondary | ICD-10-CM | POA: Diagnosis not present

## 2018-02-11 DIAGNOSIS — R32 Unspecified urinary incontinence: Secondary | ICD-10-CM | POA: Diagnosis present

## 2018-02-11 DIAGNOSIS — G9340 Encephalopathy, unspecified: Secondary | ICD-10-CM | POA: Diagnosis not present

## 2018-02-11 DIAGNOSIS — Z8249 Family history of ischemic heart disease and other diseases of the circulatory system: Secondary | ICD-10-CM | POA: Diagnosis not present

## 2018-02-11 DIAGNOSIS — I69398 Other sequelae of cerebral infarction: Secondary | ICD-10-CM | POA: Diagnosis not present

## 2018-02-11 DIAGNOSIS — Z9181 History of falling: Secondary | ICD-10-CM | POA: Diagnosis not present

## 2018-02-11 DIAGNOSIS — B964 Proteus (mirabilis) (morganii) as the cause of diseases classified elsewhere: Secondary | ICD-10-CM | POA: Diagnosis not present

## 2018-02-11 DIAGNOSIS — N3001 Acute cystitis with hematuria: Secondary | ICD-10-CM | POA: Diagnosis not present

## 2018-02-11 DIAGNOSIS — R2689 Other abnormalities of gait and mobility: Secondary | ICD-10-CM | POA: Diagnosis not present

## 2018-02-11 DIAGNOSIS — M2550 Pain in unspecified joint: Secondary | ICD-10-CM | POA: Diagnosis not present

## 2018-02-11 LAB — I-STAT TROPONIN, ED: Troponin i, poc: 0 ng/mL (ref 0.00–0.08)

## 2018-02-11 LAB — URINALYSIS, ROUTINE W REFLEX MICROSCOPIC
Bilirubin Urine: NEGATIVE
Glucose, UA: NEGATIVE mg/dL
Hgb urine dipstick: NEGATIVE
Ketones, ur: NEGATIVE mg/dL
Nitrite: POSITIVE — AB
Protein, ur: 100 mg/dL — AB
Specific Gravity, Urine: 1.02 (ref 1.005–1.030)
WBC, UA: >50 WBC/hpf — ABNORMAL HIGH (ref 0–5)
pH: 7 (ref 5.0–8.0)

## 2018-02-11 LAB — COMPREHENSIVE METABOLIC PANEL WITH GFR
ALT: 8 U/L — ABNORMAL LOW (ref 14–54)
AST: 23 U/L (ref 15–41)
Albumin: 3 g/dL — ABNORMAL LOW (ref 3.5–5.0)
Alkaline Phosphatase: 78 U/L (ref 38–126)
Anion gap: 12 (ref 5–15)
BUN: 14 mg/dL (ref 6–20)
CO2: 21 mmol/L — ABNORMAL LOW (ref 22–32)
Calcium: 8.3 mg/dL — ABNORMAL LOW (ref 8.9–10.3)
Chloride: 114 mmol/L — ABNORMAL HIGH (ref 101–111)
Creatinine, Ser: 0.69 mg/dL (ref 0.44–1.00)
GFR calc Af Amer: >60 mL/min
GFR calc non Af Amer: >60 mL/min
Glucose, Bld: 106 mg/dL — ABNORMAL HIGH (ref 65–99)
Potassium: 3.6 mmol/L (ref 3.5–5.1)
Sodium: 147 mmol/L — ABNORMAL HIGH (ref 135–145)
Total Bilirubin: 0.9 mg/dL (ref 0.3–1.2)
Total Protein: 5.7 g/dL — ABNORMAL LOW (ref 6.5–8.1)

## 2018-02-11 LAB — I-STAT CHEM 8, ED
BUN: 16 mg/dL (ref 6–20)
CALCIUM ION: 1.13 mmol/L — AB (ref 1.15–1.40)
CHLORIDE: 113 mmol/L — AB (ref 101–111)
Creatinine, Ser: 0.7 mg/dL (ref 0.44–1.00)
GLUCOSE: 106 mg/dL — AB (ref 65–99)
HCT: 40 % (ref 36.0–46.0)
Hemoglobin: 13.6 g/dL (ref 12.0–15.0)
Potassium: 3.4 mmol/L — ABNORMAL LOW (ref 3.5–5.1)
Sodium: 149 mmol/L — ABNORMAL HIGH (ref 135–145)
TCO2: 26 mmol/L (ref 22–32)

## 2018-02-11 LAB — BASIC METABOLIC PANEL
BUN: 16 (ref 4–21)
BUN: 14 (ref 4–21)
CREATININE: 0.7 (ref 0.5–1.1)
Creatinine: 0.7 (ref 0.5–1.1)
GLUCOSE: 106
GLUCOSE: 106
POTASSIUM: 3.6 (ref 3.4–5.3)
Potassium: 3.4 (ref 3.4–5.3)
SODIUM: 147 (ref 137–147)
Sodium: 149 — AB (ref 137–147)

## 2018-02-11 LAB — DIFFERENTIAL
Basophils Absolute: 0.1 K/uL (ref 0.0–0.1)
Basophils Relative: 1 %
Eosinophils Absolute: 0.2 K/uL (ref 0.0–0.7)
Eosinophils Relative: 3 %
Lymphocytes Relative: 42 %
Lymphs Abs: 3.2 K/uL (ref 0.7–4.0)
Monocytes Absolute: 0.4 K/uL (ref 0.1–1.0)
Monocytes Relative: 5 %
Neutro Abs: 3.6 K/uL (ref 1.7–7.7)
Neutrophils Relative %: 49 %

## 2018-02-11 LAB — CBC
HCT: 41.2 % (ref 36.0–46.0)
Hemoglobin: 13.2 g/dL (ref 12.0–15.0)
MCH: 31.5 pg (ref 26.0–34.0)
MCHC: 32 g/dL (ref 30.0–36.0)
MCV: 98.3 fL (ref 78.0–100.0)
Platelets: ADEQUATE K/uL (ref 150–400)
RBC: 4.19 MIL/uL (ref 3.87–5.11)
RDW: 13.4 % (ref 11.5–15.5)
WBC: 7.5 K/uL (ref 4.0–10.5)

## 2018-02-11 LAB — RAPID URINE DRUG SCREEN, HOSP PERFORMED
Amphetamines: NOT DETECTED
Barbiturates: NOT DETECTED
Benzodiazepines: NOT DETECTED
Cocaine: NOT DETECTED
Opiates: NOT DETECTED
Tetrahydrocannabinol: NOT DETECTED

## 2018-02-11 LAB — I-STAT ARTERIAL BLOOD GAS, ED
BICARBONATE: 27.7 mmol/L (ref 20.0–28.0)
O2 SAT: 90 %
PH ART: 7.312 — AB (ref 7.350–7.450)
TCO2: 29 mmol/L (ref 22–32)
pCO2 arterial: 54.3 mmHg — ABNORMAL HIGH (ref 32.0–48.0)
pO2, Arterial: 63 mmHg — ABNORMAL LOW (ref 83.0–108.0)

## 2018-02-11 LAB — PROTIME-INR

## 2018-02-11 LAB — APTT: aPTT: 28 s (ref 24–36)

## 2018-02-11 LAB — CBC AND DIFFERENTIAL
HCT: 41 (ref 36–46)
HEMATOCRIT: 40 (ref 36–46)
HEMOGLOBIN: 13.6 (ref 12.0–16.0)
Hemoglobin: 13.2 (ref 12.0–16.0)
WBC: 7.5

## 2018-02-11 LAB — CBG MONITORING, ED
Glucose-Capillary: 101 mg/dL — ABNORMAL HIGH (ref 65–99)
Glucose-Capillary: 124 mg/dL — ABNORMAL HIGH (ref 65–99)

## 2018-02-11 MED ORDER — LEVETIRACETAM IN NACL 500 MG/100ML IV SOLN: 500.0000 mg | Freq: Once | INTRAVENOUS | Status: DC

## 2018-02-11 MED ORDER — DEXTROSE-NACL 5-0.9 % IV SOLN
INTRAVENOUS | Status: DC
  Administered 2018-02-11 – 2018-02-12 (×2): via INTRAVENOUS

## 2018-02-11 MED ORDER — ENOXAPARIN SODIUM 40 MG/0.4ML ~~LOC~~ SOLN
40.0000 mg | SUBCUTANEOUS | Status: DC
  Administered 2018-02-11 – 2018-02-13 (×3): 40 mg via SUBCUTANEOUS
  Filled 2018-02-11 (×4): qty 0.4

## 2018-02-11 MED ORDER — LEVETIRACETAM IN NACL 500 MG/100ML IV SOLN
500.0000 mg | Freq: Once | INTRAVENOUS | Status: DC
  Filled 2018-02-11: qty 100

## 2018-02-11 MED ORDER — LEVETIRACETAM IN NACL 1000 MG/100ML IV SOLN
1000.0000 mg | Freq: Once | INTRAVENOUS | Status: AC
  Administered 2018-02-11: 1000 mg via INTRAVENOUS
  Filled 2018-02-11: qty 100

## 2018-02-11 MED ORDER — LEVETIRACETAM IN NACL 500 MG/100ML IV SOLN
500.0000 mg | Freq: Two times a day (BID) | INTRAVENOUS | Status: DC
  Administered 2018-02-11 – 2018-02-13 (×4): 500 mg via INTRAVENOUS
  Filled 2018-02-11 (×5): qty 100

## 2018-02-11 NOTE — ED Notes (Signed)
Pt now speaking, speech is noted to be clear. Pt still sluggish and slow to respond. Dr Freida Busman notified.

## 2018-02-11 NOTE — Progress Notes (Signed)
Interim Progress Note:  Checked on Melissa Jarvis this evening. She was resting comfortably in bed with NRB in place. She opened her eyes to my voice. She mumbled, but I could not understand what she was saying. She was able to move all 4 extremities and lightly squeezed my fingers with both hands. She was breathing comfortably and O2 saturations were 100% on NRB. Will continue to monitor closely overnight and will call CCM if there are any concerns about her ability to protect her airway.  Willadean Carol, MD PGY-3

## 2018-02-11 NOTE — ED Notes (Signed)
[name] (Daughter) [phone]  [name] (Daughter) [phone]

## 2018-02-11 NOTE — ED Notes (Signed)
This Rn in with pt, pt suddenly began making gargling noises and staring down to her right and became nonverbal again. Still moving all extremities but not responding for about 1 minute. No shaking or convulsing noted. Dr Freida Busman notified and to order keppra for possible seizure.

## 2018-02-11 NOTE — Consult Note (Addendum)
Referring Physician: Lorre Nick, MD    Chief Complaint: Code Stroke   HPI: Melissa Jarvis is an 77 y.o. female with a past medical history of hypertension, dyslipidemia, hx of stroke  previous history of failure to thrive and advanced Alzheimer's dementia who was brought to [facility], ED for acute syncopal episode with proceeding altered mental status this afternoon.  Patient currently has altered mental status, is aphasic and minimally follows commands.  Review of systems and HPI obtained from EMS and chart.  Per EMS who brought patient in for reports from the nursing home patient was in her usual state of health baseline unknown but they states she wanders and ambulates frequently in the nursing home.   Today, she was in the lunchroom when around 12 noon she suddenly stared off into Jarvis with a fixed gaze, had a reported syncopal episode.  Unclear if she had any twitching prior.  She was taken to her room where she was noted to have a large bowel movement.  She came to, but was not responsive.  EMS was called and arrival staff reported left facial droop patient continued to be awake, but not  Responsive,  nonverbal but did withdraw to painful stimuli.  Per EMS patient had a preference to laying on her left side and leaning to the left but all other symptoms had improved in the ambulance and upon arrival to the ER there was no evidence of left facial droop patient was more alert and participatory.   Initial CT of the head did not show any acute intracranial abnormality.  She was mildly hyponatremic otherwise all electrolytes within normal limits. Neuro was consulted as a code stroke  LSN:02/10/18  12pm tPA Given:NO LVO  Premorbid modified Rankin scale (mRS): 4   Past Medical History:  Diagnosis Date  . Alzheimer's disease   . Dementia   . Depressed   . Hyperlipidemia   . Hypertension     Past Surgical History:  Procedure Laterality Date  . ABDOMINAL HYSTERECTOMY    .  CHOLECYSTECTOMY    . KNEE SURGERY     total    Family History  Problem Relation Age of Onset  . Hypertension Mother    Social History:  reports that she has quit smoking. She has never used smokeless tobacco. She reports that she does not drink alcohol or use drugs.  Allergies:  Allergies  Allergen Reactions  . Penicillins Other (See Comments)    Other reaction(s): UNKNOWN REACTION Other reaction(s): UNKNOWN REACTION     Medications:   ROS: Unable to be accurately obtained secondary to altered mental status  Physical Examination: Blood pressure 133/61, pulse 70, resp. rate 18, weight 80.1 kg (176 lb 8 oz), SpO2 (!) 89 %. HEENT-  Normocephalic, no lesions, without obvious abnormality.  Normal external eye and conjunctiva.   Cardiovascular- S1-S2 audible, pulses palpable throughout   Lungs-no rhonchi or wheezing noted, no excessive working breathing.  Saturations within normal limits Abdomen- All 4 quadrants palpated and nontender Musculoskeletal-no joint tenderness, deformity or swelling Skin-warm and dry, no hyperpigmentation, vitiligo, or suspicious lesions  Neurological Examination Mental Status: Alert, sleeps often but easily arousable, patient often aphasic but when she does talk she does have clear speech with her 1 or 2 words answers.  Does occasionally follow commands. Cranial Nerves: II: Visual fields grossly normal, patient tracksadequately  III,IV, VI: ptosis not present, extra-ocular motions intact bilaterally pupils equal, round, reactive to light  V,VII: Face symmetric, but does grimace bilaterally  VIII: Hearing intact to voice IX,X: uvula rises symmetrically XI: bilateral shoulder shrug XII: midline tongue extension Motor: Patient not following commands but does spontaneously and purposefully move all extremities equally Tone and bulk:normal tone throughout; no atrophy noted Sensory: Withdraws bilateral upper and lower extremities to harsh and noxious  stimuli, grimaces in both sides symmetrically Deep Tendon Reflexes: 2+ and symmetric throughout Plantars: Right: downgoing   Left: downgoing Cerebellar: Unable to accurately perform cerebellar function test because patient minimally cooperative with altered mental status. Gait: Not assessed secondary to safety    Results for orders placed or performed during the hospital encounter of 02/11/18 (from the past 48 hour(s))  CBG monitoring, ED     Status: Abnormal   Collection Time: 02/11/18  1:27 PM  Result Value Ref Range   Glucose-Capillary 101 (H) 65 - 99 mg/dL  I-stat troponin, ED     Status: None   Collection Time: 02/11/18  1:32 PM  Result Value Ref Range   Troponin i, poc 0.00 0.00 - 0.08 ng/mL   Comment 3            Comment: Due to the release kinetics of cTnI, a negative result within the first hours of the onset of symptoms does not rule out myocardial infarction with certainty. If myocardial infarction is still suspected, repeat the test at appropriate intervals.   I-Stat Chem 8, ED     Status: Abnormal   Collection Time: 02/11/18  1:34 PM  Result Value Ref Range   Sodium 149 (H) 135 - 145 mmol/L   Potassium 3.4 (L) 3.5 - 5.1 mmol/L   Chloride 113 (H) 101 - 111 mmol/L   BUN 16 6 - 20 mg/dL   Creatinine, Ser 1.61 0.44 - 1.00 mg/dL   Glucose, Bld 096 (H) 65 - 99 mg/dL   Calcium, Ion 0.45 (L) 1.15 - 1.40 mmol/L   TCO2 26 22 - 32 mmol/L   Hemoglobin 13.6 12.0 - 15.0 g/dL   HCT 40.9 81.1 - 91.4 %   CT Head Noncon 02/11/18 IMPRESSION:  1. Atrophy and small vessel disease. No acute intracranial findings. Similar appearance to priors. 2. ASPECTS is 10.  Assessment: 77 y.o. female  with a past medical history of hypertension, dyslipidemia previous history of failure to thrive and advance  Alzheimer's dementia with behavioral disturbance  who was brought to [facility], ED for acute syncopal episode with proceeding altered mental status this afternoon.   1.  Seizure  versus rule out stroke.  Patient presented to the ED post a syncopal episode where she had brief left facial droop, staring gaze as well as loss of bowel and bladder function,.  She was not responsive at that time, but symptoms improved by the time she arrived to the ER.  Unclear if patient  had a seizure but due to the resolution of symptoms, will Keppra load with 1 g Keppra and continue 500 mg IV twice daily until patient able to tolerate p.o.  We will pursue MRI and EEG at this time. Place pt on seizure precautions and continue frequent neuro checks.  Patient has a low probability of a  stroke at this time with a negative CT of the head, but pending MRI results we will decide to pursue stroke work-up.   2. Previous CVA 2.  Advanced Alzheimer's dementia with behavioral disturbance 4.  Hypertension 5.  Dyslipidemia  Stroke Risk Factors - hyperlipidemia, hypertension and previous stroke  Plan: -Keppra 1000 mg load x1 and continue  Keppra 500 mg IV twice daily - MRI of the brain without contrast - EEG - Risk factor modification - Telemetry monitoring -  Frequent neuro checks - Seizure precautions    Melissa Space DNP Neuro-hospitalist Team [phone] 02/11/2018, 1:50 PM   NEUROHOSPITALIST ADDENDUM Seen and examined the patient today. I have reviewed the contents of history and physical exam as documented by PA/ARNP/Resident and agree with above documentation.  I have discussed and formulated the above plan as documented. Edits to the note have been made as needed.    Georgiana Spinner Saavi Mceachron MD [facility] [phone]   If 7pm to 7am, please call on call as listed on AMION.

## 2018-02-11 NOTE — ED Notes (Signed)
Per Dr. Jennette Kettle MRI to be done in 24 hours for better sensitivity.

## 2018-02-11 NOTE — ED Notes (Addendum)
Upon assessment pt noted to be verbal, able to state her name and answer yes or no questions appropriately. When Dr. Jennette Kettle entered the room pt was less responsive but able to move all extremities. Pt became lethargic with snoring respirations and decreased reaction to pain, and low oxygen saturation. Pt was on 4L [place] at that time with O2 sat of 83-85% pt placed on NRB at 15L at that time. Episode lasted approx 15 minutes. Provider at bedside for its entirety. Orders received for ABG and RT called. Pt became slowly more arousable and O2 saturations improved.

## 2018-02-11 NOTE — ED Notes (Signed)
Admitting Provider at bedside. 

## 2018-02-11 NOTE — ED Triage Notes (Signed)
Per [facility], pt form [facility]. LSN 1200 when pt was sitting in lunch room had sudden blank stare, left facial droop, ams, and non verbal. Pt normally is verbal, ambulatory(pt has dementia and wanders around). Pt is moving all extremities. Pt was noted to be diaphoretic and had a large BM and 2 episodes of vomiting. CBG 179, HR 60, BP 137/62, 90% on RA then placed on 4L [place] and up to 94%. Has hx of recent cva per nursing facility.

## 2018-02-11 NOTE — ED Provider Notes (Signed)
[facility] EMERGENCY DEPARTMENT Provider Note   CSN: [id number] Arrival date & time: 02/11/18  1324     History   Chief Complaint Chief Complaint  Patient presents with  . Code Stroke    HPI Melissa Jarvis is a 77 y.o. female.  77 year old female with history of dementia presents from nursing home with acute onset of altered mental status consisting of staring off into space when she was having a meal today.  Patient normally is verbal and ambulates.  Today she had sudden onset of left-sided facial droop became nonverbal.  Was noted to be diaphoretic and had a large bowel movement and emesis x2.  Facility called EMS and blood sugar was noted to be 179.  She was transported here for further management.  Does have a recent history of CVA.     Past Medical History:  Diagnosis Date  . Alzheimer's disease   . Dementia   . Depressed   . Hyperlipidemia   . Hypertension     Patient Active Problem List   Diagnosis Date Noted  . Chronic constipation 02/09/2018  . Dementia 12/16/2017  . Failure to thrive in adult 12/16/2017  . Hypernatremia 12/16/2017  . Hypokalemia 12/16/2017  . Hypertension 12/16/2017  . 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 Hyperlipidemia 12/16/2017  . Osteoporosis 12/16/2017  . Vitamin B12 deficiency 12/16/2017  . Depression 12/16/2017    Past Surgical History:  Procedure Laterality Date  . ABDOMINAL HYSTERECTOMY    . CHOLECYSTECTOMY    . KNEE SURGERY     total     OB History   None      Home Medications    Prior to Admission medications   Medication Sig Start Date End Date Taking? Authorizing Provider  amLODipine (NORVASC) 10 MG tablet Take 10 mg by mouth daily.    [provider]  aspirin (ASPIRIN EC) 81 MG EC tablet Take 81 mg by mouth daily. Swallow whole.     [provider]  Calcium Carb-Cholecalciferol 600-800 MG-UNIT TABS Take 1 tablet by mouth every morning.    [provider]    calcium carbonate (OS-CAL) 1250 (500 Ca) MG chewable tablet Chew 1 tablet by mouth daily.    [provider]  Cholecalciferol (D3-1000) 1000 units tablet Take 1,000 Units by mouth daily.    [provider]  Dextromethorphan-quiNIDine (NUEDEXTA) 20-10 MG CAPS Take 1 capsule by mouth.    [provider]  hydrALAZINE (APRESOLINE) 50 MG tablet Take 50 mg by mouth 2 (two) times daily.    [provider]  memantine (NAMENDA XR) 28 MG CP24 24 hr capsule Take 28 mg by mouth at bedtime.     [provider]  NUTRITIONAL SUPPLEMENTS PO Initiate 120 ml Medpass 3 times daily with meals related to weight loss    [provider]  Omega-3 Fatty Acids (FISH OIL) 1000 MG CAPS Take 1 capsule by mouth every morning.    [provider]  PARoxetine (PAXIL) 30 MG tablet Take 30 mg by mouth daily.     [provider]  polyethylene glycol (MIRALAX / GLYCOLAX) packet Take 17 g by mouth daily.    [provider]  pravastatin (PRAVACHOL) 40 MG tablet Take 40 mg by mouth daily.    [provider]  rivastigmine (EXELON) 6 MG capsule Take 6 mg by mouth 2 (two) times daily.     [provider]  vitamin B-12 (CYANOCOBALAMIN) 1000 MCG tablet Take 1,000 mcg  by mouth daily.    [provider]    Family History Family History  Problem Relation Age of Onset  . Hypertension Mother     Social History Social History   Tobacco Use  . Smoking status: Former Games developer  . Smokeless tobacco: Never Used  Substance Use Topics  . Alcohol use: No  . Drug use: No     Allergies   Penicillins   Review of Systems Review of Systems  Unable to perform ROS: Dementia     Physical Exam Updated Vital Signs BP 133/61 (BP Location: Right Arm)   Pulse 70   Resp 18   Wt 80.1 kg (176 lb 8 oz)   SpO2 (!) 89%   BMI 28.49 kg/m   Physical Exam  Constitutional: She appears well-developed and well-nourished.  Non-toxic  appearance. No distress.  HENT:  Head: Normocephalic and atraumatic.  Eyes: Pupils are equal, round, and reactive to light. Conjunctivae, EOM and lids are normal.  Neck: Normal range of motion. Neck supple. No tracheal deviation present. No thyroid mass present.  Cardiovascular: Normal rate, regular rhythm and normal heart sounds. Exam reveals no gallop.  No murmur heard. Pulmonary/Chest: Effort normal. No stridor. No respiratory distress. She has decreased breath sounds in the right lower field and the left lower field. She has no wheezes. She has no rhonchi. She has no rales.  Abdominal: Soft. Normal appearance and bowel sounds are normal. She exhibits no distension. There is no tenderness. There is no rebound and no CVA tenderness.  Musculoskeletal: Normal range of motion. She exhibits no edema or tenderness.  Neurological: She is alert. She displays atrophy. No cranial nerve deficit. GCS eye subscore is 4. GCS verbal subscore is 1. GCS motor subscore is 6.  Patient moves upper extremities to command but not lower extremities.  Unable to assess cerebellar functions.  Skin: Skin is warm and dry. No abrasion and no rash noted.  Psychiatric: Her affect is blunt.  Nursing note and vitals reviewed.    ED Treatments / Results  Labs (all labs ordered are listed, but only abnormal results are displayed) Labs Reviewed  CBG MONITORING, ED - Abnormal; Notable for the following components:      Result Value   Glucose-Capillary 101 (*)    All other components within normal limits  I-STAT CHEM 8, ED - Abnormal; Notable for the following components:   Sodium 149 (*)    Potassium 3.4 (*)    Chloride 113 (*)    Glucose, Bld 106 (*)    Calcium, Ion 1.13 (*)    All other components within normal limits  PROTIME-INR  APTT  CBC  DIFFERENTIAL  COMPREHENSIVE METABOLIC PANEL  I-STAT TROPONIN, ED    EKG None  Radiology Ct Head Code Stroke Wo Contrast  Result Date: 02/11/2018 CLINICAL DATA:   Code stroke. LEFT facial droop, and nonverbal status. Following commands. Last seen normal 1.5 hours ago. EXAM: CT HEAD WITHOUT CONTRAST TECHNIQUE: Contiguous axial images were obtained from the base of the skull through the vertex without intravenous contrast. COMPARISON:  MR brain 12/08/2017.  CT head 12/05/2017. FINDINGS: Brain: No evidence for acute infarction, hemorrhage, mass lesion, hydrocephalus, or extra-axial fluid. Generalized atrophy. Chronic microvascular ischemic change. Vascular: Calcification of the cavernous internal carotid arteries consistent with cerebrovascular atherosclerotic disease. No signs of intracranial large vessel occlusion. Skull: Normal. Negative for fracture or focal lesion. Sinuses/Orbits: No sinus or mastoid disease. Other: None. ASPECTS [facility] Stroke Program Early CT Score) - Ganglionic  level infarction (caudate, lentiform nuclei, internal capsule, insula, M1-M3 cortex): 7 - Supraganglionic infarction (M4-M6 cortex): 3 Total score (0-10 with 10 being normal): 10 IMPRESSION: 1. Atrophy and small vessel disease. No acute intracranial findings. Similar appearance to priors. 2. ASPECTS is 10. These results were communicated to Dr. Laurence Slate at 1:51 pmon 4/28/2019by text page via the [facility] messaging system. Electronically Signed   By: Elsie Stain M.D.   On: 02/11/2018 13:53    Procedures Procedures (including critical care time)  Medications Ordered in ED Medications - No data to display   Initial Impression / Assessment and Plan / ED Course  I have reviewed the triage vital signs and the nursing notes.  Pertinent labs & imaging results that were available during my care of the patient were reviewed by me and considered in my medical decision making (see chart for details).    Discussed with Dr. Laurence Slate from neurology who has seen the patient.  Head CT without acute bleed.  He is concerned the patient may have had a seizure and recommended observation.  Patient observed  here and had multiple neurological reassessments and patient now is able to talk.  Patient likely had a seizure.  Has been placed in seizure precautions.  Does have mild hyponatremia noted on her electrolytes.  Chest x-ray without evidence of aspiration.  Will consult medicine service for admission   CRITICAL CARE Performed by: Toy Baker Total critical care time: 50 minutes Critical care time was exclusive of separately billable procedures and treating other patients. Critical care was necessary to treat or prevent imminent or life-threatening deterioration. Critical care was time spent personally by me on the following activities: development of treatment plan with patient and/or surrogate as well as nursing, discussions with consultants, evaluation of patient's response to treatment, examination of patient, obtaining history from patient or surrogate, ordering and performing treatments and interventions, ordering and review of laboratory studies, ordering and review of radiographic studies, pulse oximetry and re-evaluation of patient's condition.   Final Clinical Impressions(s) / ED Diagnoses   Final diagnoses:  None    ED Discharge Orders    None       Lorre Nick, MD 02/11/18 1453

## 2018-02-11 NOTE — ED Notes (Signed)
CBG: 101, RN notified.

## 2018-02-11 NOTE — ED Notes (Signed)
Family at bedside. 

## 2018-02-11 NOTE — H&P (Addendum)
Family Medicine Teaching [facility] Admission History and Physical Service Pager: [phone]  Patient name: Melissa Jarvis Medical record number: [id number] Date of birth: 10/19/1940 Age: 77 y.o. Gender: female  Primary Care Provider: Patient, No Pcp Per Consultants: neurology Code Status: altered mental status  Chief Complaint: altered mental status  Assessment and Plan: EVELYNA FOLKER is a 77 y.o. female presenting with altered mental status, concerning for new-onset seizure vs stroke. PMH is significant for hypertension, HLD, previous stroke, failure to thrive, and advanced neurocognitive decline.  Altered mental status 2/2 seizure vs stroke:  Patient found to be staring off and was unresponsive this morning at her SNF.  She had difficulties breathing and required nonrebreather mask at SNF and again at the ED.  Mental status was waxing and waning in the ED, with patient able to state her name then later not withdrawing from pain.  Another episode of unresponsiveness followed by post-ictal behavior occurred in the ED.  No focal neurologic deficits noted.  Patient with shallow respirations and desaturations to low 80s in the ED, with improvement to mid 90s on nonrebreather.  CBGs wnl.  Vitals wnl, CBC wnl, CMP with slightly elevated Na to 147, chloride 114, protein 5.7, otherwise wnl.  UA with nitrites, LE, and bacteria, but no symptoms of UTI reported by patient's nursing staff. Also with no leukocytosis or fever, so patient not meet McGeer's criteria or Loeb's criteria. Normal neck ROM without grimacing, so meningitis unlikely. Cardiac etiology less likely with negative troponin. CT head negative.  Neurology was consulted, who agreed that seizure was most likely cause of AMS, but stroke could not be ruled out until MRI performed.  Patient given loading dose of Keppra and will continue on maintenance Keppra.  MRI brain and EEG are ordered. - admit to stepdown, attending Dr. Jennette Kettle -  appreciate neurology recommendations - NPO; can change if patient becomes more alert and passes bedside swallow - neuro checks Q4H - supplemental O2 as needed to keep saturations >90%. Will monitor respiratory status closely. Patient currently adequately protecting her airway. - s/p 1g keppra load, continue keppra 500 mg IV BID - MRI brain without contrast - EEG - Risk stratification labs- A1c, TSH, lipid panel - UDS pending (doesn't take any benzos or narcotics, but could have been given incorrect medication at SNF) - EKG ordered to rule out cardiac etiology - D5NS at maintenance - am BMP and CBC - no antibiotics for UTI since no symptoms reported by SNF nursing. Will start if she becomes afebrile or starts having infectious symptoms. - hold sedating meds: paxil, rivastigmine, namenda even if patient passes bedside swallow. Will plan to hold paxil on discharge, given that it is not indicated in geriatric patients.  HTN: BP 109/52 on admission.  Takes amlodipine 10 mg daily and hydralazine 50 mg BID at home. - hold oral meds - can give PRN IV hydralazine if patient becomes hypertensive  HLD: Patient takes pravachol 40 mg daily.  Lipid panel not in our system - restart pravachol when patient passes bedside swallow - check lipid panel  Hx of CVA: Patient with history of strokes, although exact dates and imaging of strokes not available in the chart.  SNF nurse says that her last stroke caused no longterm focal deficits.  CVAs likely have contributed to patient's loss of ADLs and may have been a focus for her seizure. - MRI brain w/o contrast  Neurocognitive decline: Patient has dementia.  She can feed herself at baseline and  rolls herself in her wheelchair but needs help for other ADLs.  She is also incontinent of bladder and bowel.  Her daughters say that she is not overly communicative but can answer simple questions appropriately at baseline.  One of her daughters is her HCPOA and says  that she is full code. - frequent neuro checks   FEN/GI: NPO Prophylaxis: Lovenox  Disposition: admit to stepdown  History of Present Illness:  **History obtain from SNF nurse due to patient's AMS   Melissa Jarvis is a 77 y.o. female presenting with altered mental status this morning.  She had breakfast as usual, but at lunch, she was noted to be staring off in the distance, then her eyes rolled back.  Her legs were limp in her wheelchair, and she was not responsive.  Staff rolled her to her room, where she had a large bowel movement, was clammy, and was not breathing.  She was given O2 with nonrebreather mask.  She also vomited once or twice during this period.  No focal neurologic deficits were seen, including no facial drooping.  No changes in her urine color, smell, or frequency have been noted by staff.  Patient has not been complaining of any pain recently.  Review Of Systems:    **ROS unable to be completed due to patient's AMS ROS  Patient Active Problem List   Diagnosis Date Noted  . Seizure (HCC) 02/11/2018  . Chronic constipation 02/09/2018  . Dementia 12/16/2017  . Failure to thrive in adult 12/16/2017  . Hypernatremia 12/16/2017  . Hypokalemia 12/16/2017  . Hypertension 12/16/2017  . 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 Hyperlipidemia 12/16/2017  . Osteoporosis 12/16/2017  . Vitamin B12 deficiency 12/16/2017  . Depression 12/16/2017    Past Medical History: Past Medical History:  Diagnosis Date  . Alzheimer's disease   . Dementia   . Depressed   . Hyperlipidemia   . Hypertension     Past Surgical History: Past Surgical History:  Procedure Laterality Date  . ABDOMINAL HYSTERECTOMY    . CHOLECYSTECTOMY    . KNEE SURGERY     total    Social History: Social History   Tobacco Use  . Smoking status: Former Games developer  . Smokeless tobacco: Never Used  Substance Use Topics  . Alcohol use: No  . Drug use: No   Additional social history: lives at  [facility] Please also refer to relevant sections of EMR.  Family History: Family History  Problem Relation Age of Onset  . Hypertension Mother     Allergies and Medications: Allergies  Allergen Reactions  . Penicillins Other (See Comments)    Other reaction(s): UNKNOWN REACTION Other reaction(s): UNKNOWN REACTION    No current facility-administered medications on file prior to encounter.    Current Outpatient Medications on File Prior to Encounter  Medication Sig Dispense Refill  . amLODipine (NORVASC) 10 MG tablet Take 10 mg by mouth daily.    Marland Kitchen aspirin (ASPIRIN EC) 81 MG EC tablet Take 81 mg by mouth daily. Swallow whole.     . bisacodyl (DULCOLAX) 10 MG suppository Place 10 mg rectally once as needed for moderate constipation (if not relieved by MOM).    . bisacodyl (FLEET) 10 MG/30ML ENEM Place 10 mg rectally once as needed (if not relieved by Bisacodyl suppository).    . Calcium Carb-Cholecalciferol 600-800 MG-UNIT TABS Take 1 tablet by mouth every morning.    . calcium carbonate (OS-CAL) 1250 (500 Ca) MG chewable tablet  Chew 1 tablet by mouth daily.    . Cholecalciferol (D3-1000) 1000 units tablet Take 1,000 Units by mouth daily.    Marland Kitchen Dextromethorphan-quiNIDine (NUEDEXTA) 20-10 MG CAPS Take 1 capsule by mouth daily.     . hydrALAZINE (APRESOLINE) 50 MG tablet Take 50 mg by mouth 2 (two) times daily.    . Magnesium Hydroxide (MILK OF MAGNESIA PO) Take 30 mLs by mouth once as needed (if no BM in 3 days).    . memantine (NAMENDA XR) 28 MG CP24 24 hr capsule Take 28 mg by mouth at bedtime.     Marland Kitchen NUTRITIONAL SUPPLEMENTS PO Initiate 120 ml Medpass 3 times daily with meals related to weight loss    . Omega-3 Fatty Acids (FISH OIL) 1000 MG CAPS Take 1 capsule by mouth every morning.    Marland Kitchen PARoxetine (PAXIL) 30 MG tablet Take 30 mg by mouth daily.     . polyethylene glycol (MIRALAX / GLYCOLAX) packet Take 17 g by mouth daily.    . pravastatin (PRAVACHOL) 40 MG tablet  Take 40 mg by mouth daily.    . rivastigmine (EXELON) 6 MG capsule Take 6 mg by mouth 2 (two) times daily.     . vitamin B-12 (CYANOCOBALAMIN) 1000 MCG tablet Take 1,000 mcg by mouth daily.      Objective: BP 132/63   Pulse 73   Temp (!) 97.5 F (36.4 C) (Oral)   Resp (!) 27   Wt 176 lb 8 oz (80.1 kg)   SpO2 93%   BMI 28.49 kg/m  Physical Exam  Constitutional:  Thin, elderly woman with nonrebreather in place  HENT:  Head: Normocephalic and atraumatic.  Nose: Nose normal.  Neck: Normal range of motion.  Cardiovascular: Normal rate, regular rhythm, normal heart sounds and intact distal pulses.  No murmur heard. Pulmonary/Chest: No stridor. She is in respiratory distress. She has no wheezes. She has no rales.  Low respiratory effort, with diminished air movement  Abdominal: Soft. Bowel sounds are normal. She exhibits no distension.  Musculoskeletal: She exhibits no edema or deformity.  Lymphadenopathy:    She has no cervical adenopathy.  Neurological: She is unresponsive. GCS eye subscore is 1. GCS verbal subscore is 1. GCS motor subscore is 1.  Skin: Skin is warm and dry.     Labs and Imaging: CBC BMET  Recent Labs  Lab 02/11/18 1325 02/11/18 1334  WBC 7.5  --   HGB 13.2 13.6  HCT 41.2 40.0  PLT PLATELET CLUMPS NOTED ON SMEAR, COUNT APPEARS ADEQUATE  --    Recent Labs  Lab 02/11/18 1325 02/11/18 1334  NA 147* 149*  K 3.6 3.4*  CL 114* 113*  CO2 21*  --   BUN 14 16  CREATININE 0.69 0.70  GLUCOSE 106* 106*  CALCIUM 8.3*  --      Dg Chest Port 1 View  Result Date: 02/11/2018 CLINICAL DATA:  Per ED notes, pt was sitting in lunch room had sudden blank stare, left facial droop, ams, and non verbal. Pt normally is verbal, ambulatory(pt has dementia and wanders around). Pt is moving all extremities. Pt was noted to be diaphoretic and had a large BM and 2 episodes of vomiting EXAM: PORTABLE CHEST 1 VIEW COMPARISON:  12/05/2017 FINDINGS: Cardiac silhouette is normal  in size. No mediastinal or hilar masses. There are prominent bronchovascular markings. No evidence of pneumonia or pulmonary edema. No pleural effusion or pneumothorax. Skeletal structures are grossly intact. IMPRESSION: No active disease. Electronically Signed  By: Amie Portland M.D.   On: 02/11/2018 14:31   Ct Head Code Stroke Wo Contrast  Result Date: 02/11/2018 CLINICAL DATA:  Code stroke. LEFT facial droop, and nonverbal status. Following commands. Last seen normal 1.5 hours ago. EXAM: CT HEAD WITHOUT CONTRAST TECHNIQUE: Contiguous axial images were obtained from the base of the skull through the vertex without intravenous contrast. COMPARISON:  MR brain 12/08/2017.  CT head 12/05/2017. FINDINGS: Brain: No evidence for acute infarction, hemorrhage, mass lesion, hydrocephalus, or extra-axial fluid. Generalized atrophy. Chronic microvascular ischemic change. Vascular: Calcification of the cavernous internal carotid arteries consistent with cerebrovascular atherosclerotic disease. No signs of intracranial large vessel occlusion. Skull: Normal. Negative for fracture or focal lesion. Sinuses/Orbits: No sinus or mastoid disease. Other: None. ASPECTS [facility] Stroke Program Early CT Score) - Ganglionic level infarction (caudate, lentiform nuclei, internal capsule, insula, M1-M3 cortex): 7 - Supraganglionic infarction (M4-M6 cortex): 3 Total score (0-10 with 10 being normal): 10 IMPRESSION: 1. Atrophy and small vessel disease. No acute intracranial findings. Similar appearance to priors. 2. ASPECTS is 10. These results were communicated to Dr. Laurence Slate at 1:51 pmon 4/28/2019by text page via the [facility] messaging system. Electronically Signed   By: Elsie Stain M.D.   On: 02/11/2018 13:53     Winfrey, Harlen Labs, MD 02/11/2018, 3:46 PM PGY-1,  Family Medicine FPTS Intern pager: [phone], text pages welcome  FPTS Upper-Level Resident Addendum  I have independently interviewed and examined the  patient. I have discussed the above with the original author and agree with their documentation. My edits for correction/addition/clarification are in blue. Please see also any attending notes.   Willadean Carol, MD PGY-3, [facility] Family Medicine FPTS Service pager: [phone] (text pages welcome through [employer])

## 2018-02-12 ENCOUNTER — Inpatient Hospital Stay (HOSPITAL_COMMUNITY): Payer: Medicare Other

## 2018-02-12 DIAGNOSIS — R4182 Altered mental status, unspecified: Secondary | ICD-10-CM

## 2018-02-12 DIAGNOSIS — F0390 Unspecified dementia without behavioral disturbance: Secondary | ICD-10-CM

## 2018-02-12 DIAGNOSIS — G9340 Encephalopathy, unspecified: Secondary | ICD-10-CM

## 2018-02-12 DIAGNOSIS — R569 Unspecified convulsions: Principal | ICD-10-CM

## 2018-02-12 LAB — BASIC METABOLIC PANEL
ANION GAP: 4 — AB (ref 5–15)
BUN: 14 mg/dL (ref 6–20)
BUN: 14 (ref 4–21)
CHLORIDE: 118 mmol/L — AB (ref 101–111)
CO2: 26 mmol/L (ref 22–32)
CREATININE: 0.6 (ref 0.5–1.1)
CREATININE: 0.63 mg/dL (ref 0.44–1.00)
Calcium: 8.4 mg/dL — ABNORMAL LOW (ref 8.9–10.3)
GFR calc Af Amer: >60 mL/min (ref >60)
GFR calc non Af Amer: >60 mL/min (ref >60)
Glucose, Bld: 137 mg/dL — ABNORMAL HIGH (ref 65–99)
Glucose: 137
POTASSIUM: 3.5 (ref 3.4–5.3)
Potassium: 3.5 mmol/L (ref 3.5–5.1)
Sodium: 148 — AB (ref 137–147)
Sodium: 148 mmol/L — ABNORMAL HIGH (ref 135–145)

## 2018-02-12 LAB — URINALYSIS, ROUTINE W REFLEX MICROSCOPIC
Bilirubin Urine: NEGATIVE
Glucose, UA: NEGATIVE mg/dL
Hgb urine dipstick: NEGATIVE
KETONES UR: NEGATIVE mg/dL
Nitrite: POSITIVE — AB
PH: 9 — AB (ref 5.0–8.0)
Protein, ur: 30 mg/dL — AB
SPECIFIC GRAVITY, URINE: 1.013 (ref 1.005–1.030)
WBC, UA: >50 WBC/hpf — ABNORMAL HIGH (ref 0–5)

## 2018-02-12 LAB — LIPID PANEL
CHOL/HDL RATIO: 3.1 ratio
Cholesterol: 152 mg/dL (ref 0–200)
HDL: 49 mg/dL (ref >40)
LDL CALC: 99 mg/dL (ref 0–99)
Triglycerides: 21 mg/dL (ref <150)
VLDL: 4 mg/dL (ref 0–40)

## 2018-02-12 LAB — HEMOGLOBIN A1C
HEMOGLOBIN A1C: 4.2 % — AB (ref 4.8–5.6)
MEAN PLASMA GLUCOSE: 73.84 mg/dL

## 2018-02-12 LAB — CBC
HEMATOCRIT: 38.6 % (ref 36.0–46.0)
HEMOGLOBIN: 12.3 g/dL (ref 12.0–15.0)
MCH: 31.1 pg (ref 26.0–34.0)
MCHC: 31.9 g/dL (ref 30.0–36.0)
MCV: 97.7 fL (ref 78.0–100.0)
Platelets: 131 10*3/uL — ABNORMAL LOW (ref 150–400)
RBC: 3.95 MIL/uL (ref 3.87–5.11)
RDW: 13.2 % (ref 11.5–15.5)
WBC: 12.7 10*3/uL — ABNORMAL HIGH (ref 4.0–10.5)

## 2018-02-12 LAB — CBC AND DIFFERENTIAL
HCT: 39 (ref 36–46)
HEMOGLOBIN: 12.3 (ref 12.0–16.0)
PLATELETS: 131 — AB (ref 150–399)
WBC: 12.7

## 2018-02-12 LAB — TSH: TSH: 0.774 u[IU]/mL (ref 0.350–4.500)

## 2018-02-12 LAB — GLUCOSE, CAPILLARY
GLUCOSE-CAPILLARY: 66 mg/dL (ref 65–99)
GLUCOSE-CAPILLARY: 83 mg/dL (ref 65–99)

## 2018-02-12 MED ORDER — DEXTROSE-NACL 5-0.45 % IV SOLN
INTRAVENOUS | Status: DC
  Administered 2018-02-12 – 2018-02-13 (×3): via INTRAVENOUS

## 2018-02-12 NOTE — ED Notes (Signed)
EEG at bedside.

## 2018-02-12 NOTE — Progress Notes (Addendum)
Family Medicine Teaching Service Daily Progress Note Intern Pager: [phone]  Patient name: Melissa Jarvis Medical record number: [id number] Date of birth: 01/20/1941 Age: 77 y.o. Gender: female  Primary Care Provider: Patient, No Pcp Per Consultants: neurology Code Status: full  Pt Overview and Major Events to Date:  Admitted 4/28  Assessment and Plan:  Melissa Jarvis is a 77 y.o. female presenting with altered mental status, concerning for new-onset seizure vs stroke. PMH is significant for hypertension, HLD, previous stroke, failure to thrive, and advanced neurocognitive decline.  Altered mental status likely 2/2 to seizure:  Patient with waxing and waning degrees of AMS.  There was concern that she would not be able to protect her airway, but she maintained saturations in the upper 90s on NRB overnight.  Awakens to sternal rub with some purposeful movement, but does not follow commands or verbally respond to questions on 4/29.  Risk stratification labs wnl.  UDS wnl. - recollect UA and start abx if still showing signs of UTI - appreciate neurology recommendations - NPO; can change if patient becomes more alert and passes bedside swallow - neuro checks Q4H - supplemental O2 as needed to keep saturations >90%. Will monitor respiratory status closely. Patient currently adequately protecting her airway. - continue keppra 500 mg IV BID - MRI brain without contrast - EEG - EKG ordered to rule out cardiac etiology - D5NS at maintenance - am BMP and CBC - no antibiotics for UTI since no symptoms reported by SNF nursing. Will start if she becomes afebrile or starts having infectious symptoms. - hold sedating meds: paxil, rivastigmine, namenda even if patient passes bedside swallow. Will plan to hold paxil on discharge, given that it is not indicated in geriatric patients.  Hypernatremia:  Has been a problem in the past for this patient.  Has been stable since admission.  Also  hyperchloremic. - change fluids to D51/2NS at maintenance  HTN: BP 109/52 on admission.  Takes amlodipine 10 mg daily and hydralazine 50 mg BID at home. - hold oral meds - can give PRN IV hydralazine if patient becomes hypertensive  HLD: Patient takes pravachol 40 mg daily.  Lipid panel wnl. - restart pravachol when patient passes bedside swallow  Hx of CVA: Patient with history of strokes, although exact dates and imaging of strokes not available in the chart.  SNF nurse says that her last stroke caused no longterm focal deficits.  CVAs likely have contributed to patient's loss of ADLs and may have been a focus for her seizure. - MRI brain w/o contrast  Neurocognitive decline: Patient has dementia.  She can feed herself at baseline and rolls herself in her wheelchair but needs help for other ADLs.  She is also incontinent of bladder and bowel.  Her daughters say that she is not overly communicative but can answer simple questions appropriately at baseline.  One of her daughters is her HCPOA and says that she is full code. - frequent neuro checks - palliative care consult   FEN/GI: NPO Prophylaxis: Lovenox  Disposition: continue in stepdown  Subjective:  Patient nonverbal this morning.  Her nurse says she responded with grunts and nods earlier this morning.  Objective: Temp:  [97.5 F (36.4 C)] 97.5 F (36.4 C) (04/28 1401) Pulse Rate:  [64-84] 73 (04/29 0700) Resp:  [14-28] 14 (04/29 0700) BP: (109-158)/(47-83) 153/71 (04/29 0700) SpO2:  [86 %-100 %] 94 % (04/29 0700) Weight:  [176 lb 8 oz (80.1 kg)] 176 lb 8 oz (80.1  kg) (04/28 1338) Physical Exam: General: sleeping in bed on arrival, some purposeful movement in response to sternal rub, nodded in response to a question, did not follow commands Cardiovascular: RRR, no MRG Respiratory: CTAB, better air movement on 4/29 compared with 4/28 Abdomen: soft, nontender Extremities: no edema, some purposeful movements of her  arms  Laboratory: Recent Labs  Lab 02/11/18 1325 02/11/18 1334 02/12/18 0521  WBC 7.5  --  12.7*  HGB 13.2 13.6 12.3  HCT 41.2 40.0 38.6  PLT PLATELET CLUMPS NOTED ON SMEAR, COUNT APPEARS ADEQUATE  --  131*   Recent Labs  Lab 02/11/18 1325 02/11/18 1334 02/12/18 0521  NA 147* 149* 148*  K 3.6 3.4* 3.5  CL 114* 113* 118*  CO2 21*  --  26  BUN CREATININE 0.69 0.70 0.63  CALCIUM 8.3*  --  8.4*  PROT 5.7*  --   --   BILITOT 0.9  --   --   ALKPHOS 78  --   --   ALT 8*  --   --   AST 23  --   --   GLUCOSE 106* 106* 137*    Imaging/Diagnostic Tests: Dg Chest Port 1 View  Result Date: 02/11/2018 CLINICAL DATA:  Per ED notes, pt was sitting in lunch room had sudden blank stare, left facial droop, ams, and non verbal. Pt normally is verbal, ambulatory(pt has dementia and wanders around). Pt is moving all extremities. Pt was noted to be diaphoretic and had a large BM and 2 episodes of vomiting EXAM: PORTABLE CHEST 1 VIEW COMPARISON:  12/05/2017 FINDINGS: Cardiac silhouette is normal in size. No mediastinal or hilar masses. There are prominent bronchovascular markings. No evidence of pneumonia or pulmonary edema. No pleural effusion or pneumothorax. Skeletal structures are grossly intact. IMPRESSION: No active disease. Electronically Signed   By: Amie Portland M.D.   On: 02/11/2018 14:31   Ct Head Code Stroke Wo Contrast  Result Date: 02/11/2018 CLINICAL DATA:  Code stroke. LEFT facial droop, and nonverbal status. Following commands. Last seen normal 1.5 hours ago. EXAM: CT HEAD WITHOUT CONTRAST TECHNIQUE: Contiguous axial images were obtained from the base of the skull through the vertex without intravenous contrast. COMPARISON:  MR brain 12/08/2017.  CT head 12/05/2017. FINDINGS: Brain: No evidence for acute infarction, hemorrhage, mass lesion, hydrocephalus, or extra-axial fluid. Generalized atrophy. Chronic microvascular ischemic change. Vascular: Calcification of the  cavernous internal carotid arteries consistent with cerebrovascular atherosclerotic disease. No signs of intracranial large vessel occlusion. Skull: Normal. Negative for fracture or focal lesion. Sinuses/Orbits: No sinus or mastoid disease. Other: None. ASPECTS [facility] Stroke Program Early CT Score) - Ganglionic level infarction (caudate, lentiform nuclei, internal capsule, insula, M1-M3 cortex): 7 - Supraganglionic infarction (M4-M6 cortex): 3 Total score (0-10 with 10 being normal): 10 IMPRESSION: 1. Atrophy and small vessel disease. No acute intracranial findings. Similar appearance to priors. 2. ASPECTS is 10. These results were communicated to Dr. Laurence Slate at 1:51 pmon 4/28/2019by text page via the [facility] messaging system. Electronically Signed   By: Elsie Stain M.D.   On: 02/11/2018 13:53     Winfrey, Harlen Labs, MD 02/12/2018, 7:27 AM PGY-1, [facility] FPTS Intern pager: [phone], text pages welcome

## 2018-02-12 NOTE — ED Notes (Signed)
Patient does not follow commands but quietly responded "Good morning" when this RN said "Good morning" on entering the room.

## 2018-02-12 NOTE — Progress Notes (Addendum)
Subjective: No complaints and showing no clinical seizure. Does not want to be bothered.   Exam: Vitals:   02/12/18 0700 02/12/18 0900  BP: (!) 153/71 (!) 141/73  Pulse: 73 76  Resp: 14 17  Temp:    SpO2: 94% 100%    Physical Exam   HEENT-  Normocephalic, no lesions, without obvious abnormality.  Normal external eye and conjunctiva.   Cardiovascular- S1-S2 audible, pulses palpable throughout   Lungs-no rhonchi or wheezing noted, no excessive working breathing.  Saturations within normal limits Abdomen- All 4 quadrants palpated and nontender Extremities- Warm, dry and intact Musculoskeletal-no joint tenderness, deformity or swelling Skin-warm and dry, no hyperpigmentation, vitiligo, or suspicious lesions    Neuro:  Mental Status: Initially sleeping and refusing to wake up but when awake would follow some commands. Continued to tell me to leave her alone. Clenching her eyes shut. Cranial Nerves: II:  Holding eyes fully clenched shut.   III,IV, VI: ptosis not present, extra-ocular motions intact bilaterally pupils equal, round, reactive to light and accommodation V,VII: facesymmetric, facial light touch sensation normal bilaterally VIII: hearing normal bilaterally IX,X: uvula rises symmetrically XI: bilateral shoulder shrug XII: midline tongue extension Motor: Moving all extremities antigravity with less movement on the right arm and localizing with left arm.  Sensory: with drawing and wincing to noxious stimuli in all 4 extremities.  Deep Tendon Reflexes: 2+ and symmetric throughout with no AJ Plantars: Right: downgoing   Left: downgoing Cerebellar: Unable to examine Gait: not tested    Medications:  Current Facility-Administered Medications  Medication Dose Route Frequency Provider Last Rate Last Dose  . dextrose 5 %-0.45 % sodium chloride infusion   Intravenous Continuous Winfrey, Amanda C, MD      . enoxaparin (LOVENOX) injection 40 mg  40 mg Subcutaneous Q24H  Lennox Solders, MD   40 mg at 02/11/18 2132  . levETIRAcetam (KEPPRA) IVPB 500 mg/100 mL premix  500 mg Intravenous Q12H Lennox Solders, MD 400 mL/hr at 02/12/18 0957 500 mg at 02/12/18 0957   Current Outpatient Medications  Medication Sig Dispense Refill  . amLODipine (NORVASC) 10 MG tablet Take 10 mg by mouth daily.    Marland Kitchen aspirin (ASPIRIN EC) 81 MG EC tablet Take 81 mg by mouth daily. Swallow whole.     . bisacodyl (DULCOLAX) 10 MG suppository Place 10 mg rectally once as needed for moderate constipation (if not relieved by MOM).    . bisacodyl (FLEET) 10 MG/30ML ENEM Place 10 mg rectally once as needed (if not relieved by Bisacodyl suppository).    . Calcium Carb-Cholecalciferol 600-800 MG-UNIT TABS Take 1 tablet by mouth every morning.    . calcium carbonate (OS-CAL) 1250 (500 Ca) MG chewable tablet Chew 1 tablet by mouth daily.    . Cholecalciferol (D3-1000) 1000 units tablet Take 1,000 Units by mouth daily.    Marland Kitchen Dextromethorphan-quiNIDine (NUEDEXTA) 20-10 MG CAPS Take 1 capsule by mouth daily.     . hydrALAZINE (APRESOLINE) 50 MG tablet Take 50 mg by mouth 2 (two) times daily.    . Magnesium Hydroxide (MILK OF MAGNESIA PO) Take 30 mLs by mouth once as needed (if no BM in 3 days).    . memantine (NAMENDA XR) 28 MG CP24 24 hr capsule Take 28 mg by mouth at bedtime.     Marland Kitchen NUTRITIONAL SUPPLEMENTS PO Initiate 120 ml Medpass 3 times daily with meals related to weight loss    . Omega-3 Fatty Acids (FISH OIL) 1000 MG CAPS Take  1 capsule by mouth every morning.    Marland Kitchen PARoxetine (PAXIL) 30 MG tablet Take 30 mg by mouth daily.     . polyethylene glycol (MIRALAX / GLYCOLAX) packet Take 17 g by mouth daily.    . pravastatin (PRAVACHOL) 40 MG tablet Take 40 mg by mouth daily.    . rivastigmine (EXELON) 6 MG capsule Take 6 mg by mouth 2 (two) times daily.     . vitamin B-12 (CYANOCOBALAMIN) 1000 MCG tablet Take 1,000 mcg by mouth daily.       Pertinent Labs/Diagnostics:   Dg Chest Port 1  View  Result Date: 02/11/2018 CLINICAL DATA:  Per ED notes, pt was sitting in lunch room had sudden blank stare, left facial droop, ams, and non verbal. Pt normally is verbal, ambulatory(pt has dementia and wanders around). Pt is moving all extremities. Pt was noted to be diaphoretic and had a large BM and 2 episodes of vomiting EXAM: PORTABLE CHEST 1 VIEW COMPARISON:  12/05/2017 FINDINGS: Cardiac silhouette is normal in size. No mediastinal or hilar masses. There are prominent bronchovascular markings. No evidence of pneumonia or pulmonary edema. No pleural effusion or pneumothorax. Skeletal structures are grossly intact. IMPRESSION: No active disease. Electronically Signed   By: Amie Portland M.D.   On: 02/11/2018 14:31   Ct Head Code Stroke Wo Contrast  Result Date: 02/11/2018 CLINICAL DATA:  Code stroke. LEFT facial droop, and nonverbal status. Following commands. Last seen normal 1.5 hours ago. EXAM: CT HEAD WITHOUT CONTRAST TECHNIQUE: Contiguous axial images were obtained from the base of the skull through the vertex without intravenous contrast. COMPARISON:  MR brain 12/08/2017.  CT head 12/05/2017. FINDINGS: Brain: No evidence for acute infarction, hemorrhage, mass lesion, hydrocephalus, or extra-axial fluid. Generalized atrophy. Chronic microvascular ischemic change. Vascular: Calcification of the cavernous internal carotid arteries consistent with cerebrovascular atherosclerotic disease. No signs of intracranial large vessel occlusion. Skull: Normal. Negative for fracture or focal lesion. Sinuses/Orbits: No sinus or mastoid disease. Other: None. ASPECTS [facility] Stroke Program Early CT Score) - Ganglionic level infarction (caudate, lentiform nuclei, internal capsule, insula, M1-M3 cortex): 7 - Supraganglionic infarction (M4-M6 cortex): 3 Total score (0-10 with 10 being normal): 10 IMPRESSION: 1. Atrophy and small vessel disease. No acute intracranial findings. Similar appearance to priors. 2. ASPECTS  is 10. These results were communicated to Dr. Laurence Slate at 1:51 pmon 4/28/2019by text page via the [facility] messaging system. Electronically Signed   By: Elsie Stain M.D.   On: 02/11/2018 13:53     Felicie Morn PA-C [facility] [phone]  Impression:  77 y.o. female   who presented with altered mental status after syncope.  Seizure versus stroke.  She continued to be fairly encephalopathic at the time of my exam, suspect postictal.   Recommendations: 1) continue Keppra 500 mg twice daily 2) EEG 3) neurology will continue to follow  Ritta Slot, MD [facility] [phone]  If 7pm- 7am, please page neurology on call as listed in AMION.    02/12/2018, 10:25 AM

## 2018-02-12 NOTE — ED Notes (Signed)
Neuro at bedside.

## 2018-02-12 NOTE — Procedures (Signed)
HPI: 77 year old with syncope and mental status change.  TECHNICAL SUMMARY:  A multichannel referential and bipolar montage EEG using the standard international 10-20 system was performed on the patient.  The dominant background activity consists of 7 hertz activity seen most prominantly over the posterior head region.  5 to 6 Hz activity could be seen intermixed in all head region.   Low voltage fast (beta) activity is distributed symmetrically and maximally over the anterior head regions.  Portions of the tracing were contaminated with significant amounts of electrical artifact, especially over the right central head region.  ACTIVATION:  Stepwise photic stimulation and hyperventilation were not performed.  EPILEPTIFORM ACTIVITY:  There were no spikes, sharp waves or paroxysmal activity.  SLEEP: No sleep is noted.  CARDIAC:  The EKG lead is contaminated with electrical artifact.  IMPRESSION:  This is an abnormal EEG demonstrating a mild diffuse slowing of electrocerebral activity.  This can be seen in a wide variety of encephalopathic state including those of a toxic, metabolic, or degenerative nature.  There were no focal, hemispheric, or lateralizing features.  No epileptiform activity was recorded.  As above, there were portions of the tracing continued with significant amount of electrical/electrode artifact which could potentially affect interpretation.  Correlate clinically.

## 2018-02-12 NOTE — Progress Notes (Signed)
Patient has urinalysis with positive nitrates and leukocyte esterase x2.  Unsure if patient's altered mental status is due to UTI.  But given that nitrite positive, increases risk of UTI with only presenting symptom as altered mental status.  Hard to obtain any other symptoms given patient's advanced dementia.  Intended to treat empirically given UA with culture pending.  Patient is n.p.o. given altered mental status with swallow eval pending.  Given this, I would like to treat with IV antibiotics, however ceftriaxone was flagged as an allergy of unknown cause.  Did not see any usage of ceftriaxone or other cephalosporin in patient's history to determine severity of allergy.  Alternative IV ciprofloxacin was considered but given patient's advanced age and altered mental status, I am unsure if benefits outweigh the risk at this time. Will discuss further with pharmacy and remainder of medical team in the a.m.

## 2018-02-12 NOTE — Progress Notes (Signed)
EEG completed; results pending.    

## 2018-02-12 NOTE — Discharge Summary (Signed)
Family Medicine Teaching [facility] Discharge Summary  Patient name: Melissa Jarvis Medical record number: [id number] Date of birth: 1940/11/12 Age: 77 y.o. Gender: female Date of Admission: 02/11/2018  Date of Discharge: 02/14/18  Admitting Physician: Lennox Solders, MD  Primary Care Provider: Patient, No Pcp Per Consultants: neurology  Indication for Hospitalization: altered mental status with respiratory distress  Discharge Diagnoses/Problem List:  AMS likely due to seizure UTI Hypernatremia HTN HLD Hx of CVA Neurocognitive Decline  Disposition: [facility]  Discharge Condition: stable, improved  Discharge Exam:  General: NAD, pleasant, laying in bed asleep, but easily aroused Eyes: PERRL, EOMI, no conjunctival pallor or injection Neck: Supple, no LAD Cardiovascular: RRR, no m/r/g, no LE edema Respiratory: CTA BL, normal work of breathing Gastrointestinal: soft, nontender, nondistended, normoactive BS Derm: no rashes appreciated Psych: AO to self but not place or time, follows commands  Brief Hospital Course:  Melissa Jarvis was admitted on 4/28 for altered mental status concerning for seizure at her SNF earlier that morning.  Melissa Jarvis continued to have waxing and waning altered mental status while in the ED, and she required a nonrebreather mask on admission due to desaturations into the low 80s.  She was given a loading dose of Keppra (1000 mg) and started on maintenance Keppra of 500 mg BID.  She was able to be transitioned from nonrebreather to nasal canula then to room air during her admission.  Head CT and MRI brain were negative for an acute intracranial process.  EEG was notable for background slowing but did not show epileptiform activity. UA showed signs of UTI and patient received Bactrim for treatment to be completed on 5/02 for a 3 day course. This also likely contributed to patient's AMS.  Her mental status improved daily until she was back to her  baseline on 4/30.  Neurology recommended continuation of Keppra and d/c to SNF, and family medicine agreed.  She was discharged to [facility] on 5/1.    Issues for Follow Up:  1. Paxil is contraindicated for geriatric patients, so it was discontinued during hospitalization and at discharge.  Please consider another SSRI if needed for this patient. 2. A goals of care discussion with the patient's family should be considered, since she is still full code even with advanced dementia. Daughter was to meet with Palliative on day of discharge. 3. Please continue Keppra 500 mg PO BID at discharge. Patient to have nuerology follow up as an outpatient. 4. Continue bactrim, septra 400-80 mg q12 until 5/02.   Significant Procedures: EEG  Significant Labs and Imaging:  Recent Labs  Lab 02/11/18 1325 02/11/18 1334 02/12/18 0521 02/13/18 1002  WBC 7.5  --  12.7* 10.5  HGB 13.2 13.6 12.3 13.4  HCT 41.2 40.0 38.6 41.1  PLT PLATELET CLUMPS NOTED ON SMEAR, COUNT APPEARS ADEQUATE  --  131* 130*   Recent Labs  Lab 02/11/18 1325 02/11/18 1334 02/12/18 0521 02/13/18 1009  NA 147* 149* 148* 146*  K 3.6 3.4* 3.5 4.0  CL 114* 113* 118* 114*  CO2 21*  --  26 21*  GLUCOSE 106* 106* 137* 87  BUN CREATININE 0.69 0.70 0.63 0.58  CALCIUM 8.3*  --  8.4* 8.5*  ALKPHOS 78  --   --   --   AST 23  --   --   --   ALT 8*  --   --   --   ALBUMIN 3.0*  --   --   --  Mr Brain Wo Contrast  Result Date: 02/12/2018 CLINICAL DATA:  Seizure EXAM: MRI HEAD WITHOUT CONTRAST TECHNIQUE: Multiplanar, multiecho pulse sequences of the brain and surrounding structures were obtained without intravenous contrast. COMPARISON:  MRI head 12/08/2017, CT 02/11/2018 FINDINGS: Brain: Image quality degraded by moderate motion. Negative for acute infarct. Moderate atrophy and moderate chronic microvascular ischemic changes throughout the white matter. Extensive chronic microhemorrhage throughout the cerebral hemispheres  bilaterally best seen on the prior study. No fluid collection or mass lesion. Vascular: Normal arterial flow voids Skull and upper cervical spine: Negative Sinuses/Orbits: Mild mucosal edema paranasal sinuses.  Normal orbit Other: None IMPRESSION: Motion degraded study, terminated early due to motion and lack of patient cooperation Negative for acute infarct. Atrophy and chronic microvascular ischemia. Numerous areas of chronic microhemorrhage the brain likely due to poorly controlled hypertension. Electronically Signed   By: Marlan Palau M.D.   On: 02/12/2018 14:03   Dg Chest Port 1 View  Result Date: 02/11/2018 CLINICAL DATA:  Per ED notes, pt was sitting in lunch room had sudden blank stare, left facial droop, ams, and non verbal. Pt normally is verbal, ambulatory(pt has dementia and wanders around). Pt is moving all extremities. Pt was noted to be diaphoretic and had a large BM and 2 episodes of vomiting EXAM: PORTABLE CHEST 1 VIEW COMPARISON:  12/05/2017 FINDINGS: Cardiac silhouette is normal in size. No mediastinal or hilar masses. There are prominent bronchovascular markings. No evidence of pneumonia or pulmonary edema. No pleural effusion or pneumothorax. Skeletal structures are grossly intact. IMPRESSION: No active disease. Electronically Signed   By: Amie Portland M.D.   On: 02/11/2018 14:31   Ct Head Code Stroke Wo Contrast  Result Date: 02/11/2018 CLINICAL DATA:  Code stroke. LEFT facial droop, and nonverbal status. Following commands. Last seen normal 1.5 hours ago. EXAM: CT HEAD WITHOUT CONTRAST TECHNIQUE: Contiguous axial images were obtained from the base of the skull through the vertex without intravenous contrast. COMPARISON:  MR brain 12/08/2017.  CT head 12/05/2017. FINDINGS: Brain: No evidence for acute infarction, hemorrhage, mass lesion, hydrocephalus, or extra-axial fluid. Generalized atrophy. Chronic microvascular ischemic change. Vascular: Calcification of the cavernous internal  carotid arteries consistent with cerebrovascular atherosclerotic disease. No signs of intracranial large vessel occlusion. Skull: Normal. Negative for fracture or focal lesion. Sinuses/Orbits: No sinus or mastoid disease. Other: None. ASPECTS [facility] Stroke Program Early CT Score) - Ganglionic level infarction (caudate, lentiform nuclei, internal capsule, insula, M1-M3 cortex): 7 - Supraganglionic infarction (M4-M6 cortex): 3 Total score (0-10 with 10 being normal): 10 IMPRESSION: 1. Atrophy and small vessel disease. No acute intracranial findings. Similar appearance to priors. 2. ASPECTS is 10. These results were communicated to Dr. Laurence Slate at 1:51 pmon 4/28/2019by text page via the [facility] messaging system. Electronically Signed   By: Elsie Stain M.D.   On: 02/11/2018 13:53    Results/Tests Pending at Time of Discharge: none  Discharge Medications:  Allergies as of 02/14/2018      Reactions   Penicillins Other (See Comments)   Other reaction(s): UNKNOWN REACTION Other reaction(s): UNKNOWN REACTION      Medication List    STOP taking these medications   PARoxetine 30 MG tablet Commonly known as:  PAXIL     TAKE these medications   amLODipine 10 MG tablet Commonly known as:  NORVASC Take 10 mg by mouth daily.   aspirin EC 81 MG EC tablet Generic drug:  aspirin Take 81 mg by mouth daily. Swallow whole.   bisacodyl 10  MG suppository Commonly known as:  DULCOLAX Place 10 mg rectally once as needed for moderate constipation (if not relieved by MOM).   bisacodyl 10 MG/30ML Enem Commonly known as:  FLEET Place 10 mg rectally once as needed (if not relieved by Bisacodyl suppository).   Calcium Carb-Cholecalciferol 600-800 MG-UNIT Tabs Take 1 tablet by mouth every morning.   calcium carbonate 1250 (500 Ca) MG chewable tablet Commonly known as:  OS-CAL Chew 1 tablet by mouth daily.   D3-1000 1000 units tablet Generic drug:  Cholecalciferol Take 1,000 Units by mouth daily.   Fish  Oil 1000 MG Caps Take 1 capsule by mouth every morning.   hydrALAZINE 50 MG tablet Commonly known as:  APRESOLINE Take 50 mg by mouth 2 (two) times daily.   levETIRAcetam 100 MG/ML solution Commonly known as:  KEPPRA Take 5 mLs (500 mg total) by mouth 2 (two) times daily.   MILK OF MAGNESIA PO Take 30 mLs by mouth once as needed (if no BM in 3 days).   NAMENDA XR 28 MG Cp24 24 hr capsule Generic drug:  memantine Take 28 mg by mouth at bedtime.   NUEDEXTA 20-10 MG Caps Generic drug:  Dextromethorphan-quiNIDine Take 1 capsule by mouth daily.   NUTRITIONAL SUPPLEMENTS PO Initiate 120 ml Medpass 3 times daily with meals related to weight loss   polyethylene glycol packet Commonly known as:  MIRALAX / GLYCOLAX Take 17 g by mouth daily.   pravastatin 40 MG tablet Commonly known as:  PRAVACHOL Take 40 mg by mouth daily.   rivastigmine 6 MG capsule Commonly known as:  EXELON Take 6 mg by mouth 2 (two) times daily.   sulfamethoxazole-trimethoprim 400-80 MG tablet Commonly known as:  BACTRIM,SEPTRA Take 2 tablets by mouth every 12 (twelve) hours for 1 day.   vitamin B-12 1000 MCG tablet Commonly known as:  CYANOCOBALAMIN Take 1,000 mcg by mouth daily.       Discharge Instructions: Please refer to Patient Instructions section of EMR for full details.  Patient was counseled important signs and symptoms that should prompt return to medical care, changes in medications, dietary instructions, activity restrictions, and follow up appointments.   Follow-Up Appointments:   Leza Apsey, [place], DO 02/14/2018, 12:06 PM PGY-1, [facility]

## 2018-02-12 NOTE — ED Notes (Signed)
Pt transferred over to hospital bed; pt dry and purewick in place.

## 2018-02-13 ENCOUNTER — Other Ambulatory Visit: Payer: Self-pay

## 2018-02-13 DIAGNOSIS — N390 Urinary tract infection, site not specified: Secondary | ICD-10-CM

## 2018-02-13 DIAGNOSIS — R319 Hematuria, unspecified: Secondary | ICD-10-CM

## 2018-02-13 LAB — BASIC METABOLIC PANEL
ANION GAP: 11 (ref 5–15)
BUN: 9 mg/dL (ref 6–20)
BUN: 9 (ref 4–21)
CO2: 21 mmol/L — ABNORMAL LOW (ref 22–32)
CREATININE: 0.58 mg/dL (ref 0.44–1.00)
Calcium: 8.5 mg/dL — ABNORMAL LOW (ref 8.9–10.3)
Chloride: 114 mmol/L — ABNORMAL HIGH (ref 101–111)
Creatinine: 0.6 (ref 0.5–1.1)
GFR calc Af Amer: >60 mL/min (ref >60)
Glucose, Bld: 87 mg/dL (ref 65–99)
Glucose: 87
Potassium: 4 mmol/L (ref 3.5–5.1)
SODIUM: 146 mmol/L — AB (ref 135–145)
Sodium: 146 (ref 137–147)

## 2018-02-13 LAB — CBC
HEMATOCRIT: 41.1 % (ref 36.0–46.0)
Hemoglobin: 13.4 g/dL (ref 12.0–15.0)
MCH: 31.9 pg (ref 26.0–34.0)
MCHC: 32.6 g/dL (ref 30.0–36.0)
MCV: 97.9 fL (ref 78.0–100.0)
PLATELETS: 130 10*3/uL — AB (ref 150–400)
RBC: 4.2 MIL/uL (ref 3.87–5.11)
RDW: 13.7 % (ref 11.5–15.5)
WBC: 10.5 10*3/uL (ref 4.0–10.5)

## 2018-02-13 LAB — GLUCOSE, CAPILLARY
GLUCOSE-CAPILLARY: 70 mg/dL (ref 65–99)
GLUCOSE-CAPILLARY: 80 mg/dL (ref 65–99)
GLUCOSE-CAPILLARY: 78 mg/dL (ref 65–99)
Glucose-Capillary: 71 mg/dL (ref 65–99)

## 2018-02-13 LAB — MRSA PCR SCREENING: MRSA by PCR: NEGATIVE

## 2018-02-13 LAB — CBC AND DIFFERENTIAL: WBC: 10.5

## 2018-02-13 MED ORDER — HYDRALAZINE HCL 20 MG/ML IJ SOLN
10.0000 mg | INTRAMUSCULAR | Status: DC | PRN
  Administered 2018-02-13: 10 mg via INTRAVENOUS
  Filled 2018-02-13: qty 1

## 2018-02-13 MED ORDER — ORAL CARE MOUTH RINSE
15.0000 mL | Freq: Two times a day (BID) | OROMUCOSAL | Status: DC
  Administered 2018-02-13: 15 mL via OROMUCOSAL

## 2018-02-13 MED ORDER — LEVETIRACETAM 100 MG/ML PO SOLN
500.0000 mg | Freq: Two times a day (BID) | ORAL | Status: DC
  Administered 2018-02-13 – 2018-02-14 (×2): 500 mg via ORAL
  Filled 2018-02-13 (×3): qty 5

## 2018-02-13 MED ORDER — LEVETIRACETAM 500 MG PO TABS: 500.0000 mg | ORAL_TABLET | Freq: Two times a day (BID) | ORAL | Status: DC

## 2018-02-13 MED ORDER — HYDRALAZINE HCL 50 MG PO TABS
50.0000 mg | ORAL_TABLET | Freq: Two times a day (BID) | ORAL | Status: DC
  Administered 2018-02-13 – 2018-02-14 (×3): 50 mg via ORAL
  Filled 2018-02-13 (×3): qty 1

## 2018-02-13 MED ORDER — AMLODIPINE BESYLATE 10 MG PO TABS
10.0000 mg | ORAL_TABLET | Freq: Every day | ORAL | Status: DC
  Administered 2018-02-13 – 2018-02-14 (×2): 10 mg via ORAL
  Filled 2018-02-13 (×2): qty 1

## 2018-02-13 MED ORDER — CHLORHEXIDINE GLUCONATE 0.12 % MT SOLN
15.0000 mL | Freq: Two times a day (BID) | OROMUCOSAL | Status: DC
  Administered 2018-02-13 – 2018-02-14 (×3): 15 mL via OROMUCOSAL
  Filled 2018-02-13 (×3): qty 15

## 2018-02-13 MED ORDER — PRAVASTATIN SODIUM 40 MG PO TABS
40.0000 mg | ORAL_TABLET | Freq: Every day | ORAL | Status: DC
  Administered 2018-02-13: 40 mg via ORAL
  Filled 2018-02-13: qty 1

## 2018-02-13 MED ORDER — POLYETHYLENE GLYCOL 3350 17 G PO PACK
17.0000 g | PACK | Freq: Every day | ORAL | Status: DC
  Administered 2018-02-13 – 2018-02-14 (×2): 17 g via ORAL
  Filled 2018-02-13 (×2): qty 1

## 2018-02-13 MED ORDER — SULFAMETHOXAZOLE-TRIMETHOPRIM 400-80 MG/5ML IV SOLN
15.0000 mg/kg/d | Freq: Three times a day (TID) | INTRAVENOUS | Status: DC
  Administered 2018-02-13: 396.8 mg via INTRAVENOUS
  Filled 2018-02-13 (×2): qty 24.8

## 2018-02-13 MED ORDER — SULFAMETHOXAZOLE-TRIMETHOPRIM 400-80 MG PO TABS
2.0000 | ORAL_TABLET | Freq: Two times a day (BID) | ORAL | Status: DC
  Administered 2018-02-13 – 2018-02-14 (×2): 2 via ORAL
  Filled 2018-02-13 (×3): qty 2

## 2018-02-13 NOTE — Progress Notes (Signed)
Subjective: She is much more awake and alert than when I saw her yesterday.  I called her nursing home where she resides, they state that she is not very verbal at baseline, will only answer questions "when she wants to" and with only a couple of words at that time.  She typically needs to be fed, though will occasionally feed herself.  Exam: Vitals:   02/13/18 1057 02/13/18 1203  BP: (!) 180/79 (!) 197/96  Pulse: 79 85  Resp: (!) 26 (!) 30  Temp:    SpO2: 96% 95%   Gen: In bed, NAD Resp: non-labored breathing, no acute distress Abd: soft, nt  Neuro: MS: Initially I thought she was asleep, but when I stimulate her she states "I am not asleep, please stop hurting me" she does not answer many questions, however, and states that she does not want it. CN: She does fixate and track across midline in both directions, does not participate with visual field testing Motor: She moves all extremities to noxious stimulation Sensory: As above   Impression: 77 year old female with seizures in the setting of dementia and UTI.  I agree with treating her UTI per internal medicine, but I would favor continuing antiepileptic therapy as I think she is at high risk for recurrent seizures without it.  Recommendations: 1) continue Keppra 2) she can follow-up as an outpatient with neurology 3) no further recommendations at this time, please call with further questions or concerns.  Ritta Slot, MD [facility] [phone]  If 7pm- 7am, please page neurology on call as listed in AMION.

## 2018-02-13 NOTE — Progress Notes (Signed)
CSW aware patient from [facility]. CSW left voicemail messages for daughters, [name] and [name]; awaiting calls back. Patient is disoriented. Full assessment to follow after CSW has spoken to patient's family.  Abigail Butts, LCSWA [phone]

## 2018-02-13 NOTE — Progress Notes (Signed)
Text paged MD due to patient's blood pressure 191/80. New orders received for PRN hydralazine. Hydralazine given to patient; will continue to monitor BP.

## 2018-02-13 NOTE — Progress Notes (Signed)
Patient has become more alert at this time and was able to respond to name and some commands.  She was able to pull her IV out and took her mittens off.  Changed gown and top sheet and patient went back to sleep.  I will keep monitoring patient.

## 2018-02-13 NOTE — Progress Notes (Signed)
Pharmacy Antibiotic Note  Melissa Jarvis is a 77 y.o. female admitted on 02/11/2018 after having a syncopal episode and altered mental status. Planning to start empiric treatment for UTI. Scr 0.6, eCrCl ~ 60 ml/min.  Plan: -Bactrim 15 mg/kg day divided into q8h dosing -Monitor renal fx, urine cx -Would be prudent to change to PO abx as soon as able, would consider giving a trial of ceftriaxone.  Height:  (162.6 cm) Weight: 175 lb 4.3 oz (79.5 kg) IBW/kg (Calculated) : 54.7  Temp (24hrs), Avg:99.3 F (37.4 C), Min:98.5 F (36.9 C), Max:99.9 F (37.7 C)  Recent Labs  Lab 02/11/18 1325 02/11/18 1334 02/12/18 0521  WBC 7.5  --  12.7*  CREATININE 0.69 0.70 0.63    Estimated Creatinine Clearance: 60.1 mL/min (by C-G formula based on SCr of 0.63 mg/dL).    Allergies  Allergen Reactions  . Penicillins Other (See Comments)    Other reaction(s): UNKNOWN REACTION Other reaction(s): UNKNOWN REACTION     Antimicrobials this admission: 4/30 Bactrim >  Dose adjustments this admission: N/A  Microbiology results: 4/30 urine cx:   Melissa Jarvis 02/13/2018 9:44 AM

## 2018-02-13 NOTE — Progress Notes (Addendum)
Family Medicine Teaching Service Daily Progress Note Intern Pager: [phone]  Patient name: Melissa Jarvis Medical record number: [id number] Date of birth: 1941/01/23 Age: 77 y.o. Gender: female  Primary Care Provider: Patient, No Pcp Per Consultants: neurology Code Status: full  Pt Overview and Major Events to Date:  Admitted 4/28  Assessment and Plan:  Melissa Jarvis is a 77 y.o. female presenting with altered mental status, concerning for new-onset seizure vs stroke. PMH is significant for hypertension, HLD, previous stroke, failure to thrive, and advanced neurocognitive decline.  Altered mental status likely 2/2 to seizure:  Patient with improved alertness on 4/30. She continues to maintain her airway and is saturating 99% on room air.  MRI with no evidence of CVA, but it was limited due to patient movement.  EEG with background slowing but no epileptiform activity captured.  Seems more likely that AMS was due to seizure given patient's improvement on Keppra.  Patient cleared for crushed meds and dysphagia 1 diet. - appreciate neurology recommendations - neuro checks Q4H - supplemental O2 as needed to keep saturations >92% - continue keppra 500 mg IV BID - D51/2NS at maintenance - hold sedating meds: paxil, rivastigmine, namenda even if patient passes bedside swallow. Will plan to hold paxil on discharge, given that it is not indicated in geriatric patients.  UTI: Patient with positive nitrites and leukocytes on two urinalyses during hospitalization.  No subjective complaints about UTI, but patient is an unreliable historian.  Due to AMS and negative UA in recent past, will treat. - IV bactrim 15 mg/kg/day, will transition to PO bactrim if able; three day course (4/30-5/2) - am CBC  Hypernatremia:  Has been a problem in the past for this patient.  Has been stable since admission.  Also hyperchloremic. - D51/2NS at maintenance - am BMP  HTN: BP 109/52 on admission; 178/75 on  4/30.  Takes amlodipine 10 mg daily and hydralazine 50 mg BID at home. - restart home meds - PRN IV hydralazine for SBP >180, DBP >100  HLD: Patient takes pravachol 40 mg daily.  Lipid panel wnl. - restart pravachol   Hx of CVA: Patient with history of strokes, although exact dates and imaging of strokes not available in the chart.  SNF nurse says that her last stroke caused no longterm focal deficits.  CVAs likely have contributed to patient's loss of ADLs and may have been a focus for her seizure. - MRI brain w/o contrast  Neurocognitive decline: Patient has dementia.  She can feed herself at baseline and rolls herself in her wheelchair but needs help for other ADLs.  She is also incontinent of bladder and bowel.  Her daughters say that she is not overly communicative but can answer simple questions appropriately at baseline.  One of her daughters is her HCPOA and says that she is full code. - frequent neuro checks - palliative care consult   FEN/GI: NPO until passes swallow screen Prophylaxis: Lovenox  Disposition: transfer to med-surg  Subjective:  Patient was able to say hello and denies pain.  She says "she pulled out her hair" and did not elaborate on that.  Was not able to answer orientation questions.  Objective: Temp:  [98.5 F (36.9 C)-99.9 F (37.7 C)] 99.5 F (37.5 C) (04/30 0734) Pulse Rate:  [31-113] 113 (04/30 0734) Resp:  [16-26] 20 (04/30 0734) BP: (140-178)/(58-118) 178/75 (04/30 0740) SpO2:  [94 %-100 %] 99 % (04/30 0734) Weight:  [175 lb 4.3 oz (79.5 kg)-175 lb 7.8  oz (79.6 kg)] 175 lb 4.3 oz (79.5 kg) (04/30 0503) Physical Exam: General: much more alert this morning and talked some, lost alertness and appeared sleepy toward end of exam.  Did not answer orientation questions but followed commands. Cardiovascular: RRR, no MRG Respiratory: CTAB, better air movement  Abdomen: soft, nontender Extremities: no edema  Laboratory: Recent Labs  Lab  02/11/18 1325 02/11/18 1334 02/12/18 0521  WBC 7.5  --  12.7*  HGB 13.2 13.6 12.3  HCT 41.2 40.0 38.6  PLT PLATELET CLUMPS NOTED ON SMEAR, COUNT APPEARS ADEQUATE  --  131*   Recent Labs  Lab 02/11/18 1325 02/11/18 1334 02/12/18 0521  NA 147* 149* 148*  K 3.6 3.4* 3.5  CL 114* 113* 118*  CO2 21*  --  26  BUN CREATININE 0.69 0.70 0.63  CALCIUM 8.3*  --  8.4*  PROT 5.7*  --   --   BILITOT 0.9  --   --   ALKPHOS 78  --   --   ALT 8*  --   --   AST 23  --   --   GLUCOSE 106* 106* 137*    Imaging/Diagnostic Tests: Mr Brain Wo Contrast  Result Date: 02/12/2018 CLINICAL DATA:  Seizure EXAM: MRI HEAD WITHOUT CONTRAST TECHNIQUE: Multiplanar, multiecho pulse sequences of the brain and surrounding structures were obtained without intravenous contrast. COMPARISON:  MRI head 12/08/2017, CT 02/11/2018 FINDINGS: Brain: Image quality degraded by moderate motion. Negative for acute infarct. Moderate atrophy and moderate chronic microvascular ischemic changes throughout the white matter. Extensive chronic microhemorrhage throughout the cerebral hemispheres bilaterally best seen on the prior study. No fluid collection or mass lesion. Vascular: Normal arterial flow voids Skull and upper cervical spine: Negative Sinuses/Orbits: Mild mucosal edema paranasal sinuses.  Normal orbit Other: None IMPRESSION: Motion degraded study, terminated early due to motion and lack of patient cooperation Negative for acute infarct. Atrophy and chronic microvascular ischemia. Numerous areas of chronic microhemorrhage the brain likely due to poorly controlled hypertension. Electronically Signed   By: Marlan Palau M.D.   On: 02/12/2018 14:03   Dg Chest Port 1 View  Result Date: 02/11/2018 CLINICAL DATA:  Per ED notes, pt was sitting in lunch room had sudden blank stare, left facial droop, ams, and non verbal. Pt normally is verbal, ambulatory(pt has dementia and wanders around). Pt is moving all extremities. Pt  was noted to be diaphoretic and had a large BM and 2 episodes of vomiting EXAM: PORTABLE CHEST 1 VIEW COMPARISON:  12/05/2017 FINDINGS: Cardiac silhouette is normal in size. No mediastinal or hilar masses. There are prominent bronchovascular markings. No evidence of pneumonia or pulmonary edema. No pleural effusion or pneumothorax. Skeletal structures are grossly intact. IMPRESSION: No active disease. Electronically Signed   By: Amie Portland M.D.   On: 02/11/2018 14:31   Ct Head Code Stroke Wo Contrast  Result Date: 02/11/2018 CLINICAL DATA:  Code stroke. LEFT facial droop, and nonverbal status. Following commands. Last seen normal 1.5 hours ago. EXAM: CT HEAD WITHOUT CONTRAST TECHNIQUE: Contiguous axial images were obtained from the base of the skull through the vertex without intravenous contrast. COMPARISON:  MR brain 12/08/2017.  CT head 12/05/2017. FINDINGS: Brain: No evidence for acute infarction, hemorrhage, mass lesion, hydrocephalus, or extra-axial fluid. Generalized atrophy. Chronic microvascular ischemic change. Vascular: Calcification of the cavernous internal carotid arteries consistent with cerebrovascular atherosclerotic disease. No signs of intracranial large vessel occlusion. Skull: Normal. Negative for fracture or focal lesion. Sinuses/Orbits:  No sinus or mastoid disease. Other: None. ASPECTS [facility] Stroke Program Early CT Score) - Ganglionic level infarction (caudate, lentiform nuclei, internal capsule, insula, M1-M3 cortex): 7 - Supraganglionic infarction (M4-M6 cortex): 3 Total score (0-10 with 10 being normal): 10 IMPRESSION: 1. Atrophy and small vessel disease. No acute intracranial findings. Similar appearance to priors. 2. ASPECTS is 10. These results were communicated to Dr. Laurence Slate at 1:51 pmon 4/28/2019by text page via the [facility] messaging system. Electronically Signed   By: Elsie Stain M.D.   On: 02/11/2018 13:53     Jazalyn Mondor, Harlen Labs, MD 02/13/2018, 9:38 AM PGY-1, [place]  Family Medicine FPTS Intern pager: [phone], text pages welcome

## 2018-02-13 NOTE — Progress Notes (Signed)
Pharmacy called for a route change on pt's PO Keppra to an oral solution d/t not being able to crush pill.  Viviano Simas, RN

## 2018-02-14 DIAGNOSIS — R569 Unspecified convulsions: Secondary | ICD-10-CM | POA: Diagnosis not present

## 2018-02-14 DIAGNOSIS — R2689 Other abnormalities of gait and mobility: Secondary | ICD-10-CM | POA: Diagnosis not present

## 2018-02-14 DIAGNOSIS — M6281 Muscle weakness (generalized): Secondary | ICD-10-CM | POA: Diagnosis not present

## 2018-02-14 DIAGNOSIS — B964 Proteus (mirabilis) (morganii) as the cause of diseases classified elsewhere: Secondary | ICD-10-CM | POA: Diagnosis not present

## 2018-02-14 DIAGNOSIS — M2550 Pain in unspecified joint: Secondary | ICD-10-CM | POA: Diagnosis not present

## 2018-02-14 DIAGNOSIS — I10 Essential (primary) hypertension: Secondary | ICD-10-CM | POA: Diagnosis not present

## 2018-02-14 DIAGNOSIS — R401 Stupor: Secondary | ICD-10-CM | POA: Diagnosis not present

## 2018-02-14 DIAGNOSIS — R488 Other symbolic dysfunctions: Secondary | ICD-10-CM | POA: Diagnosis not present

## 2018-02-14 DIAGNOSIS — Z7401 Bed confinement status: Secondary | ICD-10-CM | POA: Diagnosis not present

## 2018-02-14 DIAGNOSIS — I69398 Other sequelae of cerebral infarction: Secondary | ICD-10-CM | POA: Diagnosis not present

## 2018-02-14 DIAGNOSIS — N3001 Acute cystitis with hematuria: Secondary | ICD-10-CM

## 2018-02-14 DIAGNOSIS — G300 Alzheimer's disease with early onset: Secondary | ICD-10-CM | POA: Diagnosis not present

## 2018-02-14 DIAGNOSIS — N390 Urinary tract infection, site not specified: Secondary | ICD-10-CM | POA: Diagnosis not present

## 2018-02-14 DIAGNOSIS — E785 Hyperlipidemia, unspecified: Secondary | ICD-10-CM | POA: Diagnosis not present

## 2018-02-14 DIAGNOSIS — R319 Hematuria, unspecified: Secondary | ICD-10-CM | POA: Diagnosis not present

## 2018-02-14 DIAGNOSIS — Z9181 History of falling: Secondary | ICD-10-CM | POA: Diagnosis not present

## 2018-02-14 DIAGNOSIS — R1312 Dysphagia, oropharyngeal phase: Secondary | ICD-10-CM | POA: Diagnosis not present

## 2018-02-14 MED ORDER — SULFAMETHOXAZOLE-TRIMETHOPRIM 400-80 MG PO TABS: 2.0000 | ORAL_TABLET | Freq: Two times a day (BID) | ORAL | 0 refills | Status: AC

## 2018-02-14 MED ORDER — LEVETIRACETAM 100 MG/ML PO SOLN: 500.0000 mg | Freq: Two times a day (BID) | ORAL | 12 refills | Status: DC

## 2018-02-14 NOTE — Clinical Social Work Placement (Signed)
   CLINICAL SOCIAL WORK PLACEMENT  NOTE  Date:  02/14/2018  Patient Details  Name: Melissa Jarvis MRN: [id number] Date of Birth: 03-13-1941  Clinical Social Work is seeking post-discharge placement for this patient at the Skilled  Nursing Facility level of care (*CSW will initial, date and re-position this form in  chart as items are completed):  Yes   Patient/family provided with [place] Clinical Social Work Department's list of facilities offering this level of care within the geographic area requested by the patient (or if unable, by the patient's family).  Yes   Patient/family informed of their freedom to choose among providers that offer the needed level of care, that participate in Medicare, Medicaid or managed care program needed by the patient, have an available bed and are willing to accept the patient.  Yes   Patient/family informed of [facility]'s ownership interest in [facility] and [facility], as well as of the fact that they are under no obligation to receive care at these facilities.  PASRR submitted to EDS on       PASRR number received on       Existing PASRR number confirmed on 02/14/18     FL2 transmitted to all facilities in geographic area requested by pt/family on 02/14/18     FL2 transmitted to all facilities within larger geographic area on       Patient informed that his/her managed care company has contracts with or will negotiate with certain facilities, including the following:  [facility]     Yes   Patient/family informed of bed offers received.  Patient chooses bed at [facility]     Physician recommends and patient chooses bed at      Patient to be transferred to [facility] on 02/14/18.  Patient to be transferred to facility by [facility]     Patient family notified on 02/14/18 of transfer.  Name of family member notified:  [name], daughter     PHYSICIAN Please prepare  priority discharge summary, including medications, Please prepare prescriptions, Please sign FL2     Additional Comment:    _______________________________________________ Abigail Butts, LCSW 02/14/2018, 1:54 PM

## 2018-02-14 NOTE — Consult Note (Signed)
            [facility] CM Primary Care Navigator  02/14/2018  Melissa Jarvis 05/30/1941 [id number]   Went to see patient at the bedside to identify possible discharge needs but she was already discharged per staff report.  Per Inpatient social worker note, patient is a long term care resident at [facility] skilled nursing facility(SNF) with plans to discharge patient back to same nursing facility today.   For additional questions please contact:  Karin Golden A. Teddy Rebstock, BSN, RN-BC [facility] PRIMARY CARE Navigator Cell: [phone]

## 2018-02-14 NOTE — Progress Notes (Signed)
Patient will discharge back to [facility]. Anticipated discharge date: 02/14/18 Family notified: [name], daughter Transportation by: [facility]  Nurse to call report to [phone].   CSW signing off.  Abigail Butts, LCSWA  Clinical Social Worker

## 2018-02-14 NOTE — Progress Notes (Signed)
Family Medicine Teaching Service Daily Progress Note Intern Pager: [phone]  Patient name: Melissa Jarvis Medical record number: [id number] Date of birth: 1941-05-14 Age: 77 y.o. Gender: female  Primary Care Provider: Patient, No Pcp Per Consultants: neurology Code Status: full  Pt Overview and Major Events to Date:  Admitted 4/28  Assessment and Plan: Melissa Jarvis is a 77 y.o. female presenting with altered mental status, concerning for new-onset seizure vs stroke. PMH is significant for hypertension, HLD, previous stroke, failure to thrive, and advanced neurocognitive decline.  Altered mental status likely 2/2 to seizure:  She continues to maintain her airway and is saturating 94% on room air.  MRI with no evidence of CVA, but it was limited due to patient movement. Seems more likely that AMS was due to UTI.  - appreciate neurology recommendations- continue keppra and f/u outpatient - discontinue neuro checks Q4H - supplemental O2 as needed to keep saturations >92% - continue keppra 500 mg IV BID - D5 1/2NS at maintenance - hold sedating meds: paxil, rivastigmine, namenda even if patient passes bedside swallow.  - Will plan to hold paxil on discharge, given that it is not indicated in geriatric patients.  UTI: Patient with positive nitrites and leukocytes on 2 UA's.  No subjective complaints about UTI, but patient is an unreliable historian.  Due to AMS and negative UA in recent past, will treat. - IV bactrim 15 mg/kg/day, cotninue po bactrim for three day course (4/30-5/2) - am CBC with normal WBC  Hypernatremia, improved:  Has been a problem in the past for this patient.  Has been stable since admission.  Also hyperchloremic. - D5 1/2NS at maintenance - am BMP  HTN: BP 171/81.  Takes amlodipine 10 mg daily and hydralazine 50 mg BID at home. - continue home meds - PRN IV hydralazine for SBP >180, DBP >100  HLD: Patient takes pravachol 40 mg daily.  Lipid panel wnl. -  continue pravachol   Hx of CVA: Patient with history of strokes, although exact dates and imaging of strokes not available in the chart. CVAs likely have contributed to patient's loss of ADLs and may have been a focus for her seizure. - MRI brain w/o contrast negative for acute changes  Neurocognitive decline: Patient has dementia.  She can feed herself at baseline and rolls herself in her wheelchair but needs help for other ADLs.  She is also incontinent of bladder and bowel.  Her daughters say that she is not overly communicative but can answer simple questions appropriately at baseline.  One of her daughters is her HCPOA and says that she is full code. - frequent neuro checks - palliative care consult   FEN/GI: Dysphagia 1 with crushed meds Prophylaxis: Lovenox  Disposition: continue inpatient stay  Subjective:  Patient says she is feeling ok today. She denies pain. She says she wants some coffee.   Objective: Temp:  [99 F (37.2 C)-99.7 F (37.6 C)] 99 F (37.2 C) (05/01 0655) Pulse Rate:  [77-113] 77 (05/01 0655) Resp:  [18-30] 24 (05/01 0655) BP: (144-197)/(75-118) 171/81 (05/01 0655) SpO2:  [92 %-99 %] 96 % (05/01 0655) Weight:  [178 lb 2.1 oz (80.8 kg)] 178 lb 2.1 oz (80.8 kg) (05/01 1478) Physical Exam: General: NAD, pleasant, laying in bed asleep, but easily aroused Eyes: PERRL, EOMI, no conjunctival pallor or injection Neck: Supple, no LAD Cardiovascular: RRR, no m/r/g, no LE edema Respiratory: CTA BL, normal work of breathing Gastrointestinal: soft, nontender, nondistended, normoactive BS Derm: no  rashes appreciated Psych: AO to self but not place or time, follows commands  Laboratory: Recent Labs  Lab 02/11/18 1325 02/11/18 1334 02/12/18 0521 02/13/18 1002  WBC 7.5  --  12.7* 10.5  HGB 13.2 13.6 12.3 13.4  HCT 41.2 40.0 38.6 41.1  PLT PLATELET CLUMPS NOTED ON SMEAR, COUNT APPEARS ADEQUATE  --  131* 130*   Recent Labs  Lab 02/11/18 1325  02/11/18 1334 02/12/18 0521 02/13/18 1009  NA 147* 149* 148* 146*  K 3.6 3.4* 3.5 4.0  CL 114* 113* 118* 114*  CO2 21*  --  26 21*  BUN CREATININE 0.69 0.70 0.63 0.58  CALCIUM 8.3*  --  8.4* 8.5*  PROT 5.7*  --   --   --   BILITOT 0.9  --   --   --   ALKPHOS 78  --   --   --   ALT 8*  --   --   --   AST 23  --   --   --   GLUCOSE 106* 106* 137* 87    Imaging/Diagnostic Tests: Mr Brain Wo Contrast  Result Date: 02/12/2018 CLINICAL DATA:  Seizure EXAM: MRI HEAD WITHOUT CONTRAST TECHNIQUE: Multiplanar, multiecho pulse sequences of the brain and surrounding structures were obtained without intravenous contrast. COMPARISON:  MRI head 12/08/2017, CT 02/11/2018 FINDINGS: Brain: Image quality degraded by moderate motion. Negative for acute infarct. Moderate atrophy and moderate chronic microvascular ischemic changes throughout the white matter. Extensive chronic microhemorrhage throughout the cerebral hemispheres bilaterally best seen on the prior study. No fluid collection or mass lesion. Vascular: Normal arterial flow voids Skull and upper cervical spine: Negative Sinuses/Orbits: Mild mucosal edema paranasal sinuses.  Normal orbit Other: None IMPRESSION: Motion degraded study, terminated early due to motion and lack of patient cooperation Negative for acute infarct. Atrophy and chronic microvascular ischemia. Numerous areas of chronic microhemorrhage the brain likely due to poorly controlled hypertension. Electronically Signed   By: Marlan Palau M.D.   On: 02/12/2018 14:03    Faten Frieson, [place], DO 02/14/2018, 7:22 AM PGY-1, [facility] Family Medicine FPTS Intern pager: [phone], text pages welcome

## 2018-02-14 NOTE — NC FL2 (Signed)
[place] MEDICAID FL2 LEVEL OF CARE SCREENING TOOL     IDENTIFICATION  Patient Name: Melissa Jarvis Birthdate: 07-24-1941 Sex: female Admission Date (Current Location): 02/11/2018  [facility] and [place] Number:  Producer, television/film/video and Address:  [facility] [place], [address] [place], [place] [address]      Provider Number: [id number]  Attending Physician Name and Address:  Nestor Ramp, MD  Relative Name and Phone Number:  [name], daughter, [phone]    Current Level of Care: Hospital Recommended Level of Care: Skilled Nursing Facility Prior Approval Number:    Date Approved/Denied:   PASRR Number: [id number]  Discharge Plan: SNF    Current Diagnoses: Patient Active Problem List   Diagnosis Date Noted  . Urinary tract infection with hematuria   . Altered mental status   . Seizure (HCC) 02/11/2018  . Chronic constipation 02/09/2018  . Dementia 12/16/2017  . Failure to thrive in adult 12/16/2017  . Hypernatremia 12/16/2017  . Hypokalemia 12/16/2017  . Hypertension 12/16/2017  . 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 Hyperlipidemia 12/16/2017  . Osteoporosis 12/16/2017  . Vitamin B12 deficiency 12/16/2017  . Depression 12/16/2017    Orientation RESPIRATION BLADDER Height & Weight     Self  Normal Incontinent, External catheter Weight: 178 lb 2.1 oz (80.8 kg) Height:   (162.6 cm)  BEHAVIORAL SYMPTOMS/MOOD NEUROLOGICAL BOWEL NUTRITION STATUS      Continent Diet(please see DC summary)  AMBULATORY STATUS COMMUNICATION OF NEEDS Skin   (baseline) Verbally Normal                       Personal Care Assistance Level of Assistance  (baseline)           Functional Limitations Info  Sight, Hearing, Speech Sight Info: Adequate Hearing Info: Adequate Speech Info: Adequate    SPECIAL CARE FACTORS FREQUENCY                       Contractures Contractures Info: Not present    Additional Factors Info   Code Status, Allergies Code Status Info: Full Allergies Info: Penicillins           Current Medications (02/14/2018):  This is the current hospital active medication list Current Facility-Administered Medications  Medication Dose Route Frequency Provider Last Rate Last Dose  . amLODipine (NORVASC) tablet 10 mg  10 mg Oral Daily Lennox Solders, MD   10 mg at 02/14/18 0942  . chlorhexidine (PERIDEX) 0.12 % solution 15 mL  15 mL Mouth Rinse BID Nestor Ramp, MD   15 mL at 02/14/18 0950  . enoxaparin (LOVENOX) injection 40 mg  40 mg Subcutaneous Q24H Winfrey, Amanda C, MD   40 mg at 02/13/18 1818  . hydrALAZINE (APRESOLINE) injection 10 mg  10 mg Intravenous Q4H PRN Lennox Solders, MD   10 mg at 02/13/18 1055  . hydrALAZINE (APRESOLINE) tablet 50 mg  50 mg Oral BID Lennox Solders, MD   50 mg at 02/14/18 0942  . levETIRAcetam (KEPPRA) 100 MG/ML solution 500 mg  500 mg Oral BID Nestor Ramp, MD   500 mg at 02/14/18 0942  . MEDLINE mouth rinse  15 mL Mouth Rinse q12n4p Nestor Ramp, MD   15 mL at 02/13/18 1821  . polyethylene glycol (MIRALAX / GLYCOLAX) packet 17 g  17 g Oral Daily Lennox Solders, MD   17 g at 02/14/18 0943  .  pravastatin (PRAVACHOL) tablet 40 mg  40 mg Oral q1800 Lennox Solders, MD   40 mg at 02/13/18 1815  . sulfamethoxazole-trimethoprim (BACTRIM,SEPTRA) 400-80 MG per tablet 2 tablet  2 tablet Oral Q12H Lennox Solders, MD   2 tablet at 02/14/18 1610     Discharge Medications: Please see discharge summary for a list of discharge medications.  Relevant Imaging Results:  Relevant Lab Results:   Additional Information SSN: [id number]  Abigail Butts, LCSW

## 2018-02-14 NOTE — Clinical Social Work Note (Addendum)
Clinical Social Work Assessment  Patient Details  Name: Melissa Jarvis MRN: [id number] Date of Birth: [birth year]  Date of referral:  02/14/18               Reason for consult:  Facility Placement, Discharge Planning                Permission sought to share information with:  Facility Medical sales representative, Family Supports Permission granted to share information::  No(patient disoriented)  Name::     Melissa Jarvis  Agency::  [facility]  Relationship::  daughter  Contact Information:  [phone]  Housing/Transportation Living arrangements for the past 2 months:  Skilled Nursing Facility, Single Family Home(SNF since March 21) Source of Information:  Adult Children, Facility Patient Interpreter Needed:  None Criminal Activity/Legal Involvement Pertinent to Current Situation/Hospitalization:  No - Comment as needed Significant Relationships:  Adult Children Lives with:  Facility Resident Do you feel safe going back to the place where you live?  Yes Need for family participation in patient care:  Yes (Comment)  Care giving concerns: Patient is long term care resident at [facility].   Social Worker assessment / plan: CSW spoke to patient's daughter, [name], via phone. [name] reported that patient went to rehab at [facility] March 21 of this year, and then when rehab was done, transitioned to long term care. [name] is agreeable for patient to return to [facility] at discharge.   [name] had questions about MRI results and stated she has not spoken with a physician this admission. CSW paged MD.  CSW confirmed patient's bed with [facility]. They will be ready for patient to return today if medically ready. CSW to follow and support with discharge.   Employment status:  Retired Database administrator, [employer] In [employer]) PT Recommendations:  Not assessed at this time Information / Referral to community resources:  Skilled Nursing  Facility  Patient/Family's Response to care: Daughter appreciative of care.  Patient/Family's Understanding of and Emotional Response to Diagnosis, Current Treatment, and Prognosis: Daughter with some questions about patient's treatment this admission. Daughter agreeable for patient to return to SNF.  Emotional Assessment Appearance:  Appears stated age Attitude/Demeanor/Rapport:  Unable to Assess Affect (typically observed):  Unable to Assess Orientation:  Oriented to Self Alcohol / Substance use:  Not Applicable Psych involvement (Current and /or in the community):  No (Comment)  Discharge Needs  Concerns to be addressed:  Discharge Planning Concerns, Care Coordination Readmission within the last 30 days:  No Current discharge risk:  Physical Impairment, Cognitively Impaired Barriers to Discharge:  Continued Medical Work up   Abigail Butts, LCSW 02/14/2018, 10:21 AM

## 2018-02-14 NOTE — Progress Notes (Signed)
[facility] will receive discharge information. RN called [facility] to [facility]. Patient IV was removed.

## 2018-02-15 ENCOUNTER — Non-Acute Institutional Stay (SKILLED_NURSING_FACILITY): Payer: Medicare Other | Admitting: Internal Medicine

## 2018-02-15 ENCOUNTER — Encounter: Payer: Self-pay | Admitting: Internal Medicine

## 2018-02-15 DIAGNOSIS — R569 Unspecified convulsions: Secondary | ICD-10-CM | POA: Diagnosis not present

## 2018-02-15 DIAGNOSIS — R401 Stupor: Secondary | ICD-10-CM

## 2018-02-15 DIAGNOSIS — F0280 Dementia in other diseases classified elsewhere without behavioral disturbance: Secondary | ICD-10-CM

## 2018-02-15 DIAGNOSIS — I10 Essential (primary) hypertension: Secondary | ICD-10-CM

## 2018-02-15 DIAGNOSIS — G300 Alzheimer's disease with early onset: Secondary | ICD-10-CM | POA: Diagnosis not present

## 2018-02-15 DIAGNOSIS — F482 Pseudobulbar affect: Secondary | ICD-10-CM

## 2018-02-15 LAB — URINE CULTURE: Culture: >=100000 — AB

## 2018-02-15 NOTE — Progress Notes (Signed)
: Provider:  Randon Goldsmith. Lyn Hollingshead, MD Location:  [facility] Room Number: 212D Place of Service:  SNF (31)  PCP: Patient, No Pcp Per Patient Care Team: Patient, No Pcp Per as PCP - General (General Practice)  Extended Emergency Contact Information Primary Emergency Contact: [name],[name] Address: [address] [place], [place] [address] [place] Home Phone: [phone] Relation: Daughter Secondary Emergency Contact: [name] Mobile Phone: [phone] Relation: Daughter     Allergies: Penicillins  Chief Complaint  Patient presents with  . Readmit To SNF    Readmit to Facility     HPI: Patient is 77 y.o. 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  She difficulties breathing and required nonrebreather mask at the skilled nursing facility and again in the ED.  Mental status was waxing and waning in the ED with patient able to state her name than later not withdrawing from pain.  Another episode of unresponsiveness followed by post ictal behavior occurred in the ED.  No focal deficits were noted.  Patient was again de-satting.  UTI look like infection but no other symptoms.  CT head is negative.  Neurology was consulted who agreed that seizure was most likely cause of altered mental status but stroke could not be ruled out until MRI was performed.  Patient was admitted to [facility] from 4/28-5/1 where MR the next morning was negative for acute process.  EEG was notable for background slowing but did not show elective form activity.  UA shows signs of UTI and patient received Bactrim for treatment to be completed on 5/2 for 3-day course patient's mental status improved back to her baseline neurology recommended continuation of Keppra  as episode was most likely a seizure.   Patient is admitted to skilled nursing facility for continued residential care.  While at skilled nursing facility patient will be followed for hypertension treated with Norvasc hydralazine, PBA treated with Nuedexta, and dementia treated with Namenda and Exelon.  Past Medical History:  Diagnosis Date  . Alzheimer's disease   . Dementia   . Depressed   . Hyperlipidemia   . Hypertension   . Low back pain   . Migraines   . Osteoarthritis     Past Surgical History:  Procedure Laterality Date  . ABDOMINAL HYSTERECTOMY    . CHOLECYSTECTOMY    . KNEE SURGERY     total    Allergies as of 02/15/2018      Reactions   Penicillins Other (See Comments)   Other reaction(s): UNKNOWN REACTION Other reaction(s): UNKNOWN REACTION      Medication List        Accurate as of 02/15/18  2:59 PM. Always use your most recent med list.          amLODipine 10 MG tablet Commonly known as:  NORVASC Take 10 mg by mouth daily.   aspirin EC 81 MG EC tablet Generic drug:  aspirin Take 81 mg by mouth daily. Swallow whole.   bisacodyl 10 MG suppository Commonly known as:  DULCOLAX Place 10 mg rectally once as needed for moderate constipation (if not relieved by MOM).   bisacodyl 10 MG/30ML Enem Commonly known as:  FLEET Place 10 mg rectally once as needed (if not relieved by Bisacodyl suppository).   Calcium Carb-Cholecalciferol 600-800 MG-UNIT  Tabs Take 1 tablet by mouth every morning.   calcium carbonate 1250 (500 Ca) MG chewable tablet Commonly known as:  OS-CAL Chew 1 tablet by mouth daily.   D3-1000 1000 units tablet Generic drug:  Cholecalciferol Take 1,000 Units by mouth daily.   Fish Oil 1000 MG Caps Take 1 capsule by mouth every morning.   hydrALAZINE 50 MG tablet Commonly known as:  APRESOLINE Take 50 mg by mouth 2 (two) times daily.   levETIRAcetam 100 MG/ML solution Commonly known as:  KEPPRA Take 5 mLs (500 mg total) by mouth 2 (two) times daily.   MILK OF MAGNESIA  PO Take 30 mLs by mouth once as needed (if no BM in 3 days).   NAMENDA XR 28 MG Cp24 24 hr capsule Generic drug:  memantine Take 28 mg by mouth at bedtime.   NUEDEXTA 20-10 MG Caps Generic drug:  Dextromethorphan-quiNIDine Take 1 capsule by mouth daily.   NUTRITIONAL SUPPLEMENTS PO Initiate 120 ml Medpass 3 times daily with meals related to weight loss   polyethylene glycol packet Commonly known as:  MIRALAX / GLYCOLAX Take 17 g by mouth daily.   pravastatin 40 MG tablet Commonly known as:  PRAVACHOL Take 40 mg by mouth daily.   rivastigmine 6 MG capsule Commonly known as:  EXELON Take 6 mg by mouth 2 (two) times daily.   sulfamethoxazole-trimethoprim 400-80 MG tablet Commonly known as:  BACTRIM,SEPTRA Take 2 tablets by mouth every 12 (twelve) hours for 1 day.   vitamin B-12 1000 MCG tablet Commonly known as:  CYANOCOBALAMIN Take 1,000 mcg by mouth daily.       No orders of the defined types were placed in this encounter.   Immunization History  Administered Date(s) Administered  . Influenza, High Dose Seasonal PF 08/01/2016  . Influenza-Unspecified 07/31/2017  . PPD Test 12/09/2017  . Pneumococcal Conjugate-13 08/01/2016  . Tdap 12/16/2014    Social History   Tobacco Use  . Smoking status: Former Games developer  . Smokeless tobacco: Never Used  Substance Use Topics  . Alcohol use: No    Family history is   Family History  Problem Relation Age of Onset  . Hypertension Mother       Review of Systems  DATA OBTAINED: from patient-limited; nursing-no acute concerns GENERAL:  no fevers, fatigue, appetite changes SKIN: No itching, or rash EYES: No eye pain, redness, discharge EARS: No earache, tinnitus, change in hearing NOSE: No congestion, drainage or bleeding  MOUTH/THROAT: No mouth or tooth pain, No sore throat RESPIRATORY: No cough, wheezing, SOB CARDIAC: No chest pain, palpitations, lower extremity edema  GI: No abdominal pain, No N/V/D or  constipation, No heartburn or reflux  GU: No dysuria, frequency or urgency, or incontinence  MUSCULOSKELETAL: No unrelieved bone/joint pain NEUROLOGIC: No headache, dizziness or focal weakness PSYCHIATRIC: No c/o anxiety or sadness   Vitals:   02/15/18 1443  BP: (!) 142/62  Pulse: 80  Resp: (!) 22  Temp: 98.9 F (37.2 C)  SpO2: 94%    SpO2 Readings from Last 1 Encounters:  02/15/18 94%   Body mass index is 30.58 kg/m.     Physical Exam  GENERAL APPEARANCE: Alert,  No acute distress.  SKIN: No diaphoresis rash HEAD: Normocephalic, atraumatic  EYES: Conjunctiva/lids clear. Pupils round, reactive. EOMs intact.  EARS: External exam WNL, canals clear. Hearing grossly normal.  NOSE: No deformity or discharge.  MOUTH/THROAT: Lips w/o lesions  RESPIRATORY: Breathing is even, unlabored. Lung sounds are clear   CARDIOVASCULAR:  Heart RRR no murmurs, rubs or gallops. No peripheral edema.   GASTROINTESTINAL: Abdomen is soft, non-tender, not distended w/ normal bowel sounds. GENITOURINARY: Bladder non tender, not distended  MUSCULOSKELETAL: No abnormal joints or musculature NEUROLOGIC:  Cranial nerves 2-12 grossly intact. Moves all extremities  PSYCHIATRIC: Dementia, no behavioral issues  Patient Active Problem List   Diagnosis Date Noted  . Urinary tract infection with hematuria   . Altered mental status   . Seizure (HCC) 02/11/2018  . Chronic constipation 02/09/2018  . Dementia 12/16/2017  . Failure to thrive in adult 12/16/2017  . Hypernatremia 12/16/2017  . Hypokalemia 12/16/2017  . Hypertension 12/16/2017  . 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 Hyperlipidemia 12/16/2017  . Osteoporosis 12/16/2017  . Vitamin B12 deficiency 12/16/2017  . Depression 12/16/2017      Labs reviewed: Basic Metabolic Panel:    Component Value Date/Time   NA 146 (H) 02/13/2018 1009   NA 146 02/13/2018   K 4.0 02/13/2018 1009   CL 114 (H) 02/13/2018 1009   CO2 21 (L) 02/13/2018  1009   GLUCOSE 87 02/13/2018 1009   BUN 9 02/13/2018 1009   BUN 9 02/13/2018   CREATININE 0.58 02/13/2018 1009   CALCIUM 8.5 (L) 02/13/2018 1009   PROT 5.7 (L) 02/11/2018 1325   ALBUMIN 3.0 (L) 02/11/2018 1325   AST 23 02/11/2018 1325   ALT 8 (L) 02/11/2018 1325   ALKPHOS 78 02/11/2018 1325   BILITOT 0.9 02/11/2018 1325   GFRNONAA >60 02/13/2018 1009   GFRAA >60 02/13/2018 1009    Recent Labs    02/11/18 1325 02/11/18 1334 02/12/18 0521 02/13/18 02/13/18 1009  NA 147  147* 149*  149* 148*  148* 146 146*  K 3.6  3.6 3.4*  3.4 3.5  3.5  --  4.0  CL 114* 113* 118*  --  114*  CO2 21*  --  26  --  21*  GLUCOSE 106* 106* 137*  --  87  BUN CREATININE 0.69  0.7 0.70  0.7 0.63  0.6 0.6 0.58  CALCIUM 8.3*  --  8.4*  --  8.5*   Liver Function Tests: Recent Labs    07/24/17 2059 10/22/17 1827 12/05/17 02/11/18 1325  AST ALT 10* 14 19 8*  ALKPHOS 81 87 101 78  BILITOT 0.7 1.2  --  0.9  PROT 6.8 7.4  --  5.7*  ALBUMIN 3.4* 3.6  --  3.0*   No results for input(s): LIPASE, AMYLASE in the last 8760 hours. No results for input(s): AMMONIA in the last 8760 hours. CBC: Recent Labs    07/24/17 2059 10/22/17 1827  02/11/18 1325 02/11/18 1334 02/12/18 0521 02/13/18 02/13/18 1002  WBC 3.9* 3.9*   < > 7.5  7.5  --  12.7  12.7* 10.5 10.5  NEUTROABS 2.0 2.2  --  3.6  --   --   --   --   HGB 13.1 16.4*   < > 13.2  13.2 13.6  13.6 12.3  12.3  --  13.4  HCT 39.1 48.4*   < > 41.2  41 40.0  40 38.6  39  --  41.1  MCV 95.6 96.0  --  98.3  --  97.7  --  97.9  PLT 153 120*   < > PLATELET CLUMPS NOTED ON SMEAR, COUNT APPEARS ADEQUATE  --  131*  131*  --  130*   < > =  values in this interval not displayed.   Lipid Recent Labs    02/12/18 0521  CHOL 152  HDL 49  LDLCALC 99  TRIG 21    Cardiac Enzymes: No results for input(s): CKTOTAL, CKMB, CKMBINDEX, TROPONINI in the last 8760 hours. BNP: No results for input(s): BNP  in the last 8760 hours. No results found for: [facility] Lab Results  Component Value Date   HGBA1C 4.2 (L) 02/12/2018   Lab Results  Component Value Date   TSH 0.774 02/12/2018   No results found for: VITAMINB12 No results found for: FOLATE No results found for: IRON, TIBC, FERRITIN  Imaging and Procedures obtained prior to SNF admission: Mr Brain Wo Contrast  Result Date: 02/12/2018 CLINICAL DATA:  Seizure EXAM: MRI HEAD WITHOUT CONTRAST TECHNIQUE: Multiplanar, multiecho pulse sequences of the brain and surrounding structures were obtained without intravenous contrast. COMPARISON:  MRI head 12/08/2017, CT 02/11/2018 FINDINGS: Brain: Image quality degraded by moderate motion. Negative for acute infarct. Moderate atrophy and moderate chronic microvascular ischemic changes throughout the white matter. Extensive chronic microhemorrhage throughout the cerebral hemispheres bilaterally best seen on the prior study. No fluid collection or mass lesion. Vascular: Normal arterial flow voids Skull and upper cervical spine: Negative Sinuses/Orbits: Mild mucosal edema paranasal sinuses.  Normal orbit Other: None IMPRESSION: Motion degraded study, terminated early due to motion and lack of patient cooperation Negative for acute infarct. Atrophy and chronic microvascular ischemia. Numerous areas of chronic microhemorrhage the brain likely due to poorly controlled hypertension. Electronically Signed   By: Marlan Palau M.D.   On: 02/12/2018 14:03   Dg Chest Port 1 View  Result Date: 02/11/2018 CLINICAL DATA:  Per ED notes, pt was sitting in lunch room had sudden blank stare, left facial droop, ams, and non verbal. Pt normally is verbal, ambulatory(pt has dementia and wanders around). Pt is moving all extremities. Pt was noted to be diaphoretic and had a large BM and 2 episodes of vomiting EXAM: PORTABLE CHEST 1 VIEW COMPARISON:  12/05/2017 FINDINGS: Cardiac silhouette is normal in size. No mediastinal or  hilar masses. There are prominent bronchovascular markings. No evidence of pneumonia or pulmonary edema. No pleural effusion or pneumothorax. Skeletal structures are grossly intact. IMPRESSION: No active disease. Electronically Signed   By: Amie Portland M.D.   On: 02/11/2018 14:31   Ct Head Code Stroke Wo Contrast  Result Date: 02/11/2018 CLINICAL DATA:  Code stroke. LEFT facial droop, and nonverbal status. Following commands. Last seen normal 1.5 hours ago. EXAM: CT HEAD WITHOUT CONTRAST TECHNIQUE: Contiguous axial images were obtained from the base of the skull through the vertex without intravenous contrast. COMPARISON:  MR brain 12/08/2017.  CT head 12/05/2017. FINDINGS: Brain: No evidence for acute infarction, hemorrhage, mass lesion, hydrocephalus, or extra-axial fluid. Generalized atrophy. Chronic microvascular ischemic change. Vascular: Calcification of the cavernous internal carotid arteries consistent with cerebrovascular atherosclerotic disease. No signs of intracranial large vessel occlusion. Skull: Normal. Negative for fracture or focal lesion. Sinuses/Orbits: No sinus or mastoid disease. Other: None. ASPECTS [facility] Stroke Program Early CT Score) - Ganglionic level infarction (caudate, lentiform nuclei, internal capsule, insula, M1-M3 cortex): 7 - Supraganglionic infarction (M4-M6 cortex): 3 Total score (0-10 with 10 being normal): 10 IMPRESSION: 1. Atrophy and small vessel disease. No acute intracranial findings. Similar appearance to priors. 2. ASPECTS is 10. These results were communicated to Dr. Laurence Slate at 1:51 pmon 4/28/2019by text page via the [facility] messaging system. Electronically Signed   By: Elsie Stain M.D.   On:  02/11/2018 13:53     Not all labs, radiology exams or other studies done during hospitalization come through on my EPIC note; however they are reviewed by me.    Assessment and Plan  Altered mental status/seizure, postictal state- neurology was consulted and felt that  her altered mental status was most likely a postictal state with her staring episodes most likely seizure patient was started on Keppra SNF -admitted for residential care; continue Keppra 500 mg twice daily  Hypertension SNF -controlled; continue Norvasc 10 mg p.o. daily and hydralazine 50 mg twice daily  Dementia SNF -progressing; continue Namenda XR 28 mg 1 p.o. daily and Exelon 6 mg twice daily  PBA SNF -appears controlled; continue Nuedexta 20-10 1 p.o. daily   Spent greater than 35 minutes;> 50% of time with patient was spent reviewing records, labs, tests and studies, counseling and developing plan of care  Thurston Hole D. Lyn Hollingshead, MD

## 2018-02-18 ENCOUNTER — Encounter: Payer: Self-pay | Admitting: Internal Medicine

## 2018-02-18 DIAGNOSIS — R569 Unspecified convulsions: Secondary | ICD-10-CM | POA: Insufficient documentation

## 2018-03-06 ENCOUNTER — Non-Acute Institutional Stay (SKILLED_NURSING_FACILITY): Payer: Medicare Other

## 2018-03-06 DIAGNOSIS — Z0000 Encounter for general adult medical examination without abnormal findings: Secondary | ICD-10-CM | POA: Diagnosis not present

## 2018-03-06 DIAGNOSIS — R488 Other symbolic dysfunctions: Secondary | ICD-10-CM | POA: Diagnosis not present

## 2018-03-06 NOTE — Progress Notes (Signed)
Subjective:   Melissa Jarvis is a 77 y.o. female who presents for Medicare Annual (Subsequent) preventive examination at [facility]; incapacitated patient unable to answer questions appropriately   Last AWV-09/04/2016    Objective:     Vitals: BP 124/64 (BP Location: Left Arm, Patient Position: Sitting)   Pulse (!) 54   Temp 97.9 F (36.6 C) (Oral)   Ht  (1.626 m)   Wt 178 lb (80.7 kg)   BMI 30.55 kg/m   Body mass index is 30.55 kg/m.  Advanced Directives 03/06/2018 02/15/2018 02/13/2018 02/08/2018 01/09/2018 07/24/2017 04/30/2016  Does Patient Have a Medical Advance Directive? No No No No No No No  Would patient like information on creating a medical advance directive? No - Patient declined No - Patient declined No - Patient declined No - Patient declined No - Patient declined No - Patient declined No - patient declined information    Tobacco Social History   Tobacco Use  Smoking Status Former Smoker  Smokeless Tobacco Never Used     Counseling given: Not Answered   Clinical Intake:  Pre-visit preparation completed: No  Pain : No/denies pain     Nutritional Risks: None Diabetes: No  How often do you need to have someone help you when you read instructions, pamphlets, or other written materials from your doctor or pharmacy?: 4 - Often  Interpreter Needed?: No  Information entered by :: Tyron Russell, RN  Past Medical History:  Diagnosis Date  . Alzheimer's disease   . Dementia   . Depressed   . Hyperlipidemia   . Hypertension   . Low back pain   . Migraines   . Osteoarthritis    Past Surgical History:  Procedure Laterality Date  . ABDOMINAL HYSTERECTOMY    . CHOLECYSTECTOMY    . KNEE SURGERY     total   Family History  Problem Relation Age of Onset  . Hypertension Mother    Social History   Socioeconomic History  . Marital status: Widowed    Spouse name: Not on file  . Number of children: Not on file  . Years of  education: Not on file  . Highest education level: Not on file  Occupational History  . Not on file  Social Needs  . Financial resource strain: Not on file  . Food insecurity:    Worry: Not on file    Inability: Not on file  . Transportation needs:    Medical: Not on file    Non-medical: Not on file  Tobacco Use  . Smoking status: Former Games developer  . Smokeless tobacco: Never Used  Substance and Sexual Activity  . Alcohol use: No  . Drug use: No  . Sexual activity: Not on file  Lifestyle  . Physical activity:    Days per week: Not on file    Minutes per session: Not on file  . Stress: Not on file  Relationships  . Social connections:    Talks on phone: Not on file    Gets together: Not on file    Attends religious service: Not on file    Active member of club or organization: Not on file    Attends meetings of clubs or organizations: Not on file    Relationship status: Not on file  Other Topics Concern  . Not on file  Social History Narrative   Lives with daughter.  Daughter needs help to provide care.     High risk  for readmission due to dementia and falls.       Outpatient Encounter Medications as of 03/06/2018  Medication Sig  . amLODipine (NORVASC) 10 MG tablet Take 10 mg by mouth daily.  Marland Kitchen aspirin (ASPIRIN EC) 81 MG EC tablet Take 81 mg by mouth daily. Swallow whole.   . bisacodyl (DULCOLAX) 10 MG suppository Place 10 mg rectally once as needed for moderate constipation (if not relieved by MOM).  . bisacodyl (FLEET) 10 MG/30ML ENEM Place 10 mg rectally once as needed (if not relieved by Bisacodyl suppository).  . Calcium Carb-Cholecalciferol 600-800 MG-UNIT TABS Take 1 tablet by mouth every morning.  . calcium carbonate (OS-CAL) 1250 (500 Ca) MG chewable tablet Chew 1 tablet by mouth daily.  . Cholecalciferol (D3-1000) 1000 units tablet Take 1,000 Units by mouth daily.  Marland Kitchen Dextromethorphan-quiNIDine (NUEDEXTA) 20-10 MG CAPS Take 1 capsule by mouth daily.   .  hydrALAZINE (APRESOLINE) 50 MG tablet Take 50 mg by mouth 2 (two) times daily.  Marland Kitchen levETIRAcetam (KEPPRA) 100 MG/ML solution Take 5 mLs (500 mg total) by mouth 2 (two) times daily.  . Magnesium Hydroxide (MILK OF MAGNESIA PO) Take 30 mLs by mouth once as needed (if no BM in 3 days).  . memantine (NAMENDA XR) 28 MG CP24 24 hr capsule Take 28 mg by mouth at bedtime.   Marland Kitchen NUTRITIONAL SUPPLEMENTS PO Initiate 120 ml Medpass 3 times daily with meals related to weight loss  . Omega-3 Fatty Acids (FISH OIL) 1000 MG CAPS Take 1 capsule by mouth every morning.  . polyethylene glycol (MIRALAX / GLYCOLAX) packet Take 17 g by mouth daily.  . pravastatin (PRAVACHOL) 40 MG tablet Take 40 mg by mouth daily.  . rivastigmine (EXELON) 6 MG capsule Take 6 mg by mouth 2 (two) times daily.   . vitamin B-12 (CYANOCOBALAMIN) 1000 MCG tablet Take 1,000 mcg by mouth daily.   No facility-administered encounter medications on file as of 03/06/2018.     Activities of Daily Living In your present state of health, do you have any difficulty performing the following activities: 03/06/2018 02/13/2018  Hearing? N N  Vision? N N  Difficulty concentrating or making decisions? Malvin Johns  Walking or climbing stairs? Y Y  Dressing or bathing? Y Y  Doing errands, shopping? Malvin Johns  Preparing Food and eating ? Y -  Using the Toilet? Y -  In the past six months, have you accidently leaked urine? Y -  Do you have problems with loss of bowel control? Y -  Managing your Medications? Y -  Managing your Finances? Y -  Housekeeping or managing your Housekeeping? Y -  Some recent data might be hidden    Patient Care Team: Patient, No Pcp Per as PCP - General (General Practice)    Assessment:   This is a routine wellness examination for [place].  Exercise Activities and Dietary recommendations Current Exercise Habits: The patient does not participate in regular exercise at present, Exercise limited by: neurologic condition(s)  Goals     None      Fall Risk Fall Risk  03/06/2018  Falls in the past year? No   Is the patient's home free of loose throw rugs in walkways, pet beds, electrical cords, etc?   yes      Grab bars in the bathroom? yes      Handrails on the stairs?   yes      Adequate lighting?   yes   Depression Screen PHQ 2/9 Scores  03/06/2018  Exception Documentation Medical reason     Cognitive Function MMSE - Mini Mental State Exam 03/06/2018  Not completed: Unable to complete        Immunization History  Administered Date(s) Administered  . Influenza, High Dose Seasonal PF 08/01/2016  . Influenza-Unspecified 07/31/2017  . PPD Test 12/09/2017  . Pneumococcal Conjugate-13 08/01/2016  . Tdap 12/16/2014    Qualifies for Shingles Vaccine? Not in past records  Screening Tests Health Maintenance  Topic Date Due  . DEXA SCAN  03/10/2018 (Originally 02/07/2006)  . PNA vac Low Risk Adult (2 of 2 - PPSV23) 03/10/2018 (Originally 08/01/2017)  . INFLUENZA VACCINE  05/17/2018  . TETANUS/TDAP  12/15/2024    Cancer Screenings: Lung: Low Dose CT Chest recommended if Age 63-80 years, 30 pack-year currently smoking OR have quit w/in 15years. Patient does not qualify. Breast:  Up to date on Mammogram? Yes   Up to date of Bone Density/Dexa? No, excluded due to nonambulatory and dementia Colorectal: up to date  Additional Screenings:  Hepatitis C Screening: unable to declined or accept PNA23 due, ordered    Plan:    I have personally reviewed and addressed the Medicare Annual Wellness questionnaire and have noted the following in the patient's chart:  A. Medical and social history B. Use of alcohol, tobacco or illicit drugs  C. Current medications and supplements D. Functional ability and status E.  Nutritional status F.  Physical activity G. Advance directives H. List of other physicians I.  Hospitalizations, surgeries, and ER visits in previous 12 months J.  Vitals K. Screenings to include  hearing, vision, cognitive, depression L. Referrals and appointments - none  In addition, I am unable to review and discuss with incapacitated patient certain preventive protocols, quality metrics, and best practice recommendations. A written personalized care plan for preventive services as well as general preventive health recommendations were provided to patient.   See attached scanned questionnaire for additional information.   Signed,   Tyron Russell, RN Nurse Health Advisor  Patient Concerns: None

## 2018-03-06 NOTE — Patient Instructions (Signed)
Melissa Jarvis , Thank you for taking time to come for your Medicare Wellness Visit. I appreciate your ongoing commitment to your health goals. Please review the following plan we discussed and let me know if I can assist you in the future.   Screening recommendations/referrals: Colonoscopy excluded, over age 77 Mammogram excluded, over age 13 Bone Density excluded due to dementia and nonambulatory Recommended yearly ophthalmology/optometry visit for glaucoma screening and checkup Recommended yearly dental visit for hygiene and checkup  Vaccinations: Influenza vaccine up to date, due 2019 fall season Pneumococcal vaccine 23 due, ordered Tdap vaccine up to date, due 12/15/2024 Shingles vaccine not in past records    Advanced directives: Need a copy for chart  Conditions/risks identified: none  Next appointment: Dr. Lyn Hollingshead makes rounds   Preventive Care 65 Years and Older, Female Preventive care refers to lifestyle choices and visits with your health care provider that can promote health and wellness. What does preventive care include?  A yearly physical exam. This is also called an annual well check.  Dental exams once or twice a year.  Routine eye exams. Ask your health care provider how often you should have your eyes checked.  Personal lifestyle choices, including:  Daily care of your teeth and gums.  Regular physical activity.  Eating a healthy diet.  Avoiding tobacco and drug use.  Limiting alcohol use.  Practicing safe sex.  Taking low-dose aspirin every day.  Taking vitamin and mineral supplements as recommended by your health care provider. What happens during an annual well check? The services and screenings done by your health care provider during your annual well check will depend on your age, overall health, lifestyle risk factors, and family history of disease. Counseling  Your health care provider may ask you questions about your:  Alcohol  use.  Tobacco use.  Drug use.  Emotional well-being.  Home and relationship well-being.  Sexual activity.  Eating habits.  History of falls.  Memory and ability to understand (cognition).  Work and work Astronomer.  Reproductive health. Screening  You may have the following tests or measurements:  Height, weight, and BMI.  Blood pressure.  Lipid and cholesterol levels. These may be checked every 5 years, or more frequently if you are over 77 years old.  Skin check.  Lung cancer screening. You may have this screening every year starting at age 51 if you have a 30-pack-year history of smoking and currently smoke or have quit within the past 15 years.  Fecal occult blood test (FOBT) of the stool. You may have this test every year starting at age 75.  Flexible sigmoidoscopy or colonoscopy. You may have a sigmoidoscopy every 5 years or a colonoscopy every 10 years starting at age 18.  Hepatitis C blood test.  Hepatitis B blood test.  Sexually transmitted disease (STD) testing.  Diabetes screening. This is done by checking your blood sugar (glucose) after you have not eaten for a while (fasting). You may have this done every 1-3 years.  Bone density scan. This is done to screen for osteoporosis. You may have this done starting at age 6.  Mammogram. This may be done every 1-2 years. Talk to your health care provider about how often you should have regular mammograms. Talk with your health care provider about your test results, treatment options, and if necessary, the need for more tests. Vaccines  Your health care provider may recommend certain vaccines, such as:  Influenza vaccine. This is recommended every year.  Tetanus,  diphtheria, and acellular pertussis (Tdap, Td) vaccine. You may need a Td booster every 10 years.  Zoster vaccine. You may need this after age 37.  Pneumococcal 13-valent conjugate (PCV13) vaccine. One dose is recommended after age  61.  Pneumococcal polysaccharide (PPSV23) vaccine. One dose is recommended after age 79. Talk to your health care provider about which screenings and vaccines you need and how often you need them. This information is not intended to replace advice given to you by your health care provider. Make sure you discuss any questions you have with your health care provider. Document Released: 10/30/2015 Document Revised: 06/22/2016 Document Reviewed: 08/04/2015 Elsevier Interactive Patient Education  2017 [place] Prevention in the Home Falls can cause injuries. They can happen to people of all ages. There are many things you can do to make your home safe and to help prevent falls. What can I do on the outside of my home?  Regularly fix the edges of walkways and driveways and fix any cracks.  Remove anything that might make you trip as you walk through a door, such as a raised step or threshold.  Trim any bushes or trees on the path to your home.  Use bright outdoor lighting.  Clear any walking paths of anything that might make someone trip, such as rocks or tools.  Regularly check to see if handrails are loose or broken. Make sure that both sides of any steps have handrails.  Any raised decks and porches should have guardrails on the edges.  Have any leaves, snow, or ice cleared regularly.  Use sand or salt on walking paths during winter.  Clean up any spills in your garage right away. This includes oil or grease spills. What can I do in the bathroom?  Use night lights.  Install grab bars by the toilet and in the tub and shower. Do not use towel bars as grab bars.  Use non-skid mats or decals in the tub or shower.  If you need to sit down in the shower, use a plastic, non-slip stool.  Keep the floor dry. Clean up any water that spills on the floor as soon as it happens.  Remove soap buildup in the tub or shower regularly.  Attach bath mats securely with double-sided  non-slip rug tape.  Do not have throw rugs and other things on the floor that can make you trip. What can I do in the bedroom?  Use night lights.  Make sure that you have a light by your bed that is easy to reach.  Do not use any sheets or blankets that are too big for your bed. They should not hang down onto the floor.  Have a firm chair that has side arms. You can use this for support while you get dressed.  Do not have throw rugs and other things on the floor that can make you trip. What can I do in the kitchen?  Clean up any spills right away.  Avoid walking on wet floors.  Keep items that you use a lot in easy-to-reach places.  If you need to reach something above you, use a strong step stool that has a grab bar.  Keep electrical cords out of the way.  Do not use floor polish or wax that makes floors slippery. If you must use wax, use non-skid floor wax.  Do not have throw rugs and other things on the floor that can make you trip. What can I do with  my stairs?  Do not leave any items on the stairs.  Make sure that there are handrails on both sides of the stairs and use them. Fix handrails that are broken or loose. Make sure that handrails are as long as the stairways.  Check any carpeting to make sure that it is firmly attached to the stairs. Fix any carpet that is loose or worn.  Avoid having throw rugs at the top or bottom of the stairs. If you do have throw rugs, attach them to the floor with carpet tape.  Make sure that you have a light switch at the top of the stairs and the bottom of the stairs. If you do not have them, ask someone to add them for you. What else can I do to help prevent falls?  Wear shoes that:  Do not have high heels.  Have rubber bottoms.  Are comfortable and fit you well.  Are closed at the toe. Do not wear sandals.  If you use a stepladder:  Make sure that it is fully opened. Do not climb a closed stepladder.  Make sure that both  sides of the stepladder are locked into place.  Ask someone to hold it for you, if possible.  Clearly mark and make sure that you can see:  Any grab bars or handrails.  First and last steps.  Where the edge of each step is.  Use tools that help you move around (mobility aids) if they are needed. These include:  Canes.  Walkers.  Scooters.  Crutches.  Turn on the lights when you go into a dark area. Replace any light bulbs as soon as they burn out.  Set up your furniture so you have a clear path. Avoid moving your furniture around.  If any of your floors are uneven, fix them.  If there are any pets around you, be aware of where they are.  Review your medicines with your doctor. Some medicines can make you feel dizzy. This can increase your chance of falling. Ask your doctor what other things that you can do to help prevent falls. This information is not intended to replace advice given to you by your health care provider. Make sure you discuss any questions you have with your health care provider. Document Released: 07/30/2009 Document Revised: 03/10/2016 Document Reviewed: 11/07/2014 Elsevier Interactive Patient Education  2017 [employer].

## 2018-03-20 ENCOUNTER — Non-Acute Institutional Stay (SKILLED_NURSING_FACILITY): Payer: Medicare Other | Admitting: Internal Medicine

## 2018-03-20 ENCOUNTER — Encounter: Payer: Self-pay | Admitting: Internal Medicine

## 2018-03-20 DIAGNOSIS — E538 Deficiency of other specified B group vitamins: Secondary | ICD-10-CM | POA: Diagnosis not present

## 2018-03-20 DIAGNOSIS — F482 Pseudobulbar affect: Secondary | ICD-10-CM | POA: Diagnosis not present

## 2018-03-20 DIAGNOSIS — G300 Alzheimer's disease with early onset: Secondary | ICD-10-CM | POA: Diagnosis not present

## 2018-03-20 DIAGNOSIS — F0280 Dementia in other diseases classified elsewhere without behavioral disturbance: Secondary | ICD-10-CM

## 2018-03-20 NOTE — Progress Notes (Signed)
[facility] Room Number: 212-D Place of Service:  SNF [phone]  Randon Goldsmithnne D. Lyn HollingsheadAlexander, MD  Patient Care Team: Patient, No Pcp Per as PCP - General (General Practice)  Extended Emergency Contact Information Primary Emergency Contact: [name],[name] Address: [address]          [place], [place] [address] [place] Home Phone: [phone] Relation: Daughter Secondary Emergency Contact: [name], Tracy Mobile Phone: [phone] Relation: Daughter    Allergies: Penicillins  Chief Complaint  Patient presents with  . Medical Management of Chronic Issues    Routine Visit    HPI: Patient is 77 y.o. female who is being seen for routine issues of dementia, vitamin B12 deficiency and PBA.  Past Medical History:  Diagnosis Date  . Alzheimer's disease   . Dementia   . Depressed   . Hyperlipidemia   . Hypertension   . Low back pain   . Migraines   . Osteoarthritis     Past Surgical History:  Procedure Laterality Date  . ABDOMINAL HYSTERECTOMY    . CHOLECYSTECTOMY    . KNEE SURGERY     total    Allergies as of 03/20/2018      Reactions   Penicillins Other (See Comments)   Other reaction(s): UNKNOWN REACTION Other reaction(s): UNKNOWN REACTION      Medication List        Accurate as of 03/20/18 11:59 PM. Always use your most recent med list.          amLODipine 10 MG tablet Commonly known as:  NORVASC Take 10 mg by mouth daily.   aspirin EC 81 MG EC tablet Generic drug:  aspirin Take 81 mg by mouth daily. Swallow whole.   bisacodyl 10 MG suppository Commonly known as:  DULCOLAX Place 10 mg rectally once as needed for moderate constipation (if not relieved by MOM).   bisacodyl 10 MG/30ML Enem Commonly known as:  FLEET Place 10 mg rectally once as needed (if not relieved by Bisacodyl suppository).   Calcium Carb-Cholecalciferol 600-800 MG-UNIT Tabs Take 1 tablet by mouth every morning.   calcium  carbonate 1250 (500 Ca) MG chewable tablet Commonly known as:  OS-CAL Chew 1 tablet by mouth daily.   D3-1000 1000 units tablet Generic drug:  Cholecalciferol Take 1,000 Units by mouth daily.   Fish Oil 1000 MG Caps Take 1 capsule by mouth every morning.   hydrALAZINE 50 MG tablet Commonly known as:  APRESOLINE Take 50 mg by mouth 2 (two) times daily.   levETIRAcetam 100 MG/ML solution Commonly known as:  KEPPRA Take 5 mLs (500 mg total) by mouth 2 (two) times daily.   MILK OF MAGNESIA PO Take 30 mLs by mouth once as needed (if no BM in 3 days).   NAMENDA XR 28 MG Cp24 24 hr capsule Generic drug:  memantine Take 28 mg by mouth at bedtime.   NUEDEXTA 20-10 MG Caps Generic drug:  Dextromethorphan-quiNIDine Take 1 capsule by mouth daily.   NUTRITIONAL SUPPLEMENTS PO Initiate 120 ml Medpass 3 times daily with meals related to weight loss   polyethylene glycol packet Commonly known as:  MIRALAX / GLYCOLAX Take 17 g by mouth daily.   pravastatin 40 MG tablet Commonly known as:  PRAVACHOL Take 40 mg by mouth daily.   rivastigmine 6 MG capsule Commonly known as:  EXELON Take 6 mg by mouth 2 (two) times daily.   vitamin B-12 1000 MCG tablet Commonly  known as:  CYANOCOBALAMIN Take 1,000 mcg by mouth daily.       No orders of the defined types were placed in this encounter.   Immunization History  Administered Date(s) Administered  . Influenza, High Dose Seasonal PF 08/01/2016  . Influenza-Unspecified 07/31/2017  . PPD Test 12/09/2017  . Pneumococcal Conjugate-13 08/01/2016  . Tdap 12/16/2014    Social History   Tobacco Use  . Smoking status: Former Games developer  . Smokeless tobacco: Never Used  Substance Use Topics  . Alcohol use: No    Review of Systems  DATA OBTAINED: from patient-limited; nursing no acute concerns GENERAL:  no fevers, fatigue, appetite changes SKIN: No itching, rash HEENT: No complaint RESPIRATORY: No cough, wheezing, SOB CARDIAC: No  chest pain, palpitations, lower extremity edema  GI: No abdominal pain, No N/V/D or constipation, No heartburn or reflux  GU: No dysuria, frequency or urgency, or incontinence  MUSCULOSKELETAL: No unrelieved bone/joint pain NEUROLOGIC: No headache, dizziness  PSYCHIATRIC: No overt anxiety or sadness  Vitals:   03/20/18 1031  BP: 131/66  Pulse: 88  Resp: 20  Temp: 97.8 F (36.6 C)   Body mass index is 29.32 kg/m. Physical Exam  GENERAL APPEARANCE: Alert,  No acute distress  SKIN: No diaphoresis rash HEENT: Unremarkable RESPIRATORY: Breathing is even, unlabored. Lung sounds are clear   CARDIOVASCULAR: Heart RRR no murmurs, rubs or gallops. No peripheral edema  GASTROINTESTINAL: Abdomen is soft, non-tender, not distended w/ normal bowel sounds.  GENITOURINARY: Bladder non tender, not distended  MUSCULOSKELETAL: No abnormal joints or musculature NEUROLOGIC: Cranial nerves 2-12 grossly intact. Moves all extremities PSYCHIATRIC: Dementia no behavioral issues  Patient Active Problem List   Diagnosis Date Noted  . Post-ictal state (HCC) 02/18/2018  . Urinary tract infection with hematuria   . Altered mental status   . Seizure (HCC) 02/11/2018  . Chronic constipation 02/09/2018  . Dementia 12/16/2017  . Failure to thrive in adult 12/16/2017  . Hypernatremia 12/16/2017  . Hypokalemia 12/16/2017  . Hypertension 12/16/2017  . 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 Hyperlipidemia 12/16/2017  . Osteoporosis 12/16/2017  . Vitamin B12 deficiency 12/16/2017  . Depression 12/16/2017    CMP     Component Value Date/Time   NA 146 (H) 02/13/2018 1009   NA 146 02/13/2018   K 4.0 02/13/2018 1009   CL 114 (H) 02/13/2018 1009   CO2 21 (L) 02/13/2018 1009   GLUCOSE 87 02/13/2018 1009   BUN 9 02/13/2018 1009   BUN 9 02/13/2018   CREATININE 0.58 02/13/2018 1009   CALCIUM 8.5 (L) 02/13/2018 1009   PROT 5.7 (L) 02/11/2018 1325   ALBUMIN 3.0 (L) 02/11/2018 1325   AST 23 02/11/2018  1325   ALT 8 (L) 02/11/2018 1325   ALKPHOS 78 02/11/2018 1325   BILITOT 0.9 02/11/2018 1325   GFRNONAA >60 02/13/2018 1009   GFRAA >60 02/13/2018 1009   Recent Labs    02/11/18 1325 02/11/18 1334 02/12/18 0521 02/13/18 02/13/18 1009  NA 147  147* 149*  149* 148*  148* 146 146*  K 3.6  3.6 3.4*  3.4 3.5  3.5  --  4.0  CL 114* 113* 118*  --  114*  CO2 21*  --  26  --  21*  GLUCOSE 106* 106* 137*  --  87  BUN 14  14 16  16 14  14 9 9   CREATININE 0.69  0.7 0.70  0.7 0.63  0.6 0.6 0.58  CALCIUM 8.3*  --  8.4*  --  8.5*   Recent Labs    07/24/17 2059 10/22/17 1827 12/05/17 02/11/18 1325  AST 17 24 21 23   ALT 10* 14 19 8*  ALKPHOS 81 87 101 78  BILITOT 0.7 1.2  --  0.9  PROT 6.8 7.4  --  5.7*  ALBUMIN 3.4* 3.6  --  3.0*   Recent Labs    07/24/17 2059 10/22/17 1827  02/11/18 1325 02/11/18 1334 02/12/18 0521 02/13/18 02/13/18 1002  WBC 3.9* 3.9*   < > 7.5  7.5  --  12.7  12.7* 10.5 10.5  NEUTROABS 2.0 2.2  --  3.6  --   --   --   --   HGB 13.1 16.4*   < > 13.2  13.2 13.6  13.6 12.3  12.3  --  13.4  HCT 39.1 48.4*   < > 41.2  41 40.0  40 38.6  39  --  41.1  MCV 95.6 96.0  --  98.3  --  97.7  --  97.9  PLT 153 120*   < > PLATELET CLUMPS NOTED ON SMEAR, COUNT APPEARS ADEQUATE  --  131*  131*  --  130*   < > = values in this interval not displayed.   Recent Labs    02/12/18 0521  CHOL 152  LDLCALC 99  TRIG 21   No results found for: [facility] Lab Results  Component Value Date   TSH 0.774 02/12/2018   Lab Results  Component Value Date   HGBA1C 4.2 (L) 02/12/2018   Lab Results  Component Value Date   CHOL 152 02/12/2018   HDL 49 02/12/2018   LDLCALC 99 02/12/2018   TRIG 21 02/12/2018   CHOLHDL 3.1 02/12/2018    Significant Diagnostic Results in last 30 days:  No results found.  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 daily     Naveed Humphres D. Lyn Hollingshead, MD

## 2018-04-15 ENCOUNTER — Encounter: Payer: Self-pay | Admitting: Internal Medicine

## 2018-04-15 NOTE — Assessment & Plan Note (Signed)
And progressive; continue Namenda XR 28 mg daily and Exelon 6 mg twice daily

## 2018-04-15 NOTE — Assessment & Plan Note (Signed)
Stable; continue vitamin B12 replacement 1000 mcg daily

## 2018-04-15 NOTE — Assessment & Plan Note (Signed)
No reported problems; continue Nuedexta 10/20 p.o. daily

## 2018-04-16 ENCOUNTER — Encounter: Payer: Self-pay | Admitting: Internal Medicine

## 2018-04-16 ENCOUNTER — Non-Acute Institutional Stay (SKILLED_NURSING_FACILITY): Payer: Medicare Other | Admitting: Internal Medicine

## 2018-04-16 DIAGNOSIS — M810 Age-related osteoporosis without current pathological fracture: Secondary | ICD-10-CM

## 2018-04-16 DIAGNOSIS — E785 Hyperlipidemia, unspecified: Secondary | ICD-10-CM | POA: Diagnosis not present

## 2018-04-16 DIAGNOSIS — I10 Essential (primary) hypertension: Secondary | ICD-10-CM | POA: Diagnosis not present

## 2018-04-16 NOTE — Progress Notes (Signed)
Location:  [facility] Room Number: 212-D Place of Service:  SNF [id number])  Randon Goldsmith. Lyn Hollingshead, MD  Patient Care Team: Patient, No Pcp Per as PCP - General (General Practice)  Extended Emergency Contact Information Primary Emergency Contact: [name],[name] Address: [address] [place], [place] [address] [place] Home Phone: [phone] Relation: Daughter Secondary Emergency Contact: [name] Mobile Phone: [phone] Relation: Daughter    Allergies: Penicillins  Chief Complaint  Patient presents with  . Medical Management of Chronic Issues    Routine Visit    HPI: Patient is 77 y.o. female who is being seen for routine issues of hypertension, hyperlipidemia, and osteoporosis.  Past Medical History:  Diagnosis Date  . Acute encephalopathy   . Altered mental status   . Alzheimer's disease   . Chronic constipation   . Dementia   . Dementia   . Depressed   . Failure to thrive in adult   . HCAP (healthcare-associated pneumonia)   . Hyperlipidemia   . Hypernatremia   . Hypertension   . Hypokalemia   . Low back pain   . Migraines   . Osteoarthritis   . Osteoporosis   . PBA (pseudobulbar affect)   . Post-ictal state (HCC)   . Seizures (HCC)   . Urinary tract infection with hematuria   . Vitamin B12 deficiency     Past Surgical History:  Procedure Laterality Date  . ABDOMINAL HYSTERECTOMY    . CHOLECYSTECTOMY    . KNEE SURGERY     total    Allergies as of 04/16/2018      Reactions   Penicillins Other (See Comments)   Other reaction(s): UNKNOWN REACTION Other reaction(s): UNKNOWN REACTION      Medication List        Accurate as of 04/16/18 11:59 PM. Always use your most recent med list.          amLODipine 10 MG tablet Commonly known as:  NORVASC Take 10 mg by mouth daily.   aspirin EC 81 MG EC tablet Generic drug:  aspirin Take 81 mg by mouth daily. Swallow whole.   bisacodyl 10 MG  suppository Commonly known as:  DULCOLAX Place 10 mg rectally daily as needed for moderate constipation (if not relieved by MOM).   bisacodyl 10 MG/30ML Enem Commonly known as:  FLEET Place 10 mg rectally daily as needed (if not relieved by Bisacodyl suppository).   Calcium Carb-Cholecalciferol 600-800 MG-UNIT Tabs Take 1 tablet by mouth every morning.   calcium carbonate 1250 (500 Ca) MG chewable tablet Commonly known as:  OS-CAL Chew 1 tablet by mouth daily.   D3-1000 1000 units tablet Generic drug:  Cholecalciferol Take 1,000 Units by mouth daily.   Fish Oil 1000 MG Caps Take 1 capsule by mouth every morning.   hydrALAZINE 50 MG tablet Commonly known as:  APRESOLINE Take 50 mg by mouth 2 (two) times daily.   levETIRAcetam 100 MG/ML solution Commonly known as:  KEPPRA Take 5 mLs (500 mg total) by mouth 2 (two) times daily.   MILK OF MAGNESIA PO Take 30 mLs by mouth daily as needed (if no BM in 3 days).   NAMENDA XR 28 MG Cp24 24 hr capsule Generic drug:  memantine Take 28 mg by mouth at bedtime.   NUEDEXTA 20-10 MG Caps Generic drug:  Dextromethorphan-quiNIDine Take 1 capsule by mouth daily.   NUTRITIONAL SUPPLEMENTS PO Initiate 120  ml Medpass 3 times daily with meals related to weight loss   polyethylene glycol packet Commonly known as:  MIRALAX / GLYCOLAX Take 17 g by mouth daily.   pravastatin 40 MG tablet Commonly known as:  PRAVACHOL Take 40 mg by mouth daily.   rivastigmine 6 MG capsule Commonly known as:  EXELON Take 6 mg by mouth 2 (two) times daily.   vitamin B-12 1000 MCG tablet Commonly known as:  CYANOCOBALAMIN Take 1,000 mcg by mouth daily.       No orders of the defined types were placed in this encounter.   Immunization History  Administered Date(s) Administered  . Influenza, High Dose Seasonal PF 08/01/2016  . Influenza-Unspecified 07/31/2017  . PPD Test 12/09/2017  . Pneumococcal Conjugate-13 08/01/2016  . Tdap 12/16/2014     Social History   Tobacco Use  . Smoking status: Former Games developer  . Smokeless tobacco: Never Used  Substance Use Topics  . Alcohol use: No    Review of Systems  DATA OBTAINED: from patient limited; nursing-no acute concerns GENERAL:  no fevers, fatigue, appetite changes SKIN: No itching, rash HEENT: No complaint RESPIRATORY: No cough, wheezing, SOB CARDIAC: No chest pain, palpitations, lower extremity edema  GI: No abdominal pain, No N/V/D or constipation, No heartburn or reflux  GU: No dysuria, frequency or urgency, or incontinence  MUSCULOSKELETAL: No unrelieved bone/joint pain NEUROLOGIC: No headache, dizziness  PSYCHIATRIC: No overt anxiety or sadness  Vitals:   04/16/18 1520  BP: 126/74  Pulse: 74  Resp: 18  Temp: 97.9 F (36.6 C)   Body mass index is 29.18 kg/m. Physical Exam  GENERAL APPEARANCE: Alert,  No acute distress  SKIN: No diaphoresis rash HEENT: Unremarkable RESPIRATORY: Breathing is even, unlabored. Lung sounds are clear   CARDIOVASCULAR: Heart RRR no murmurs, rubs or gallops. No peripheral edema  GASTROINTESTINAL: Abdomen is soft, non-tender, not distended w/ normal bowel sounds.  GENITOURINARY: Bladder non tender, not distended  MUSCULOSKELETAL: No abnormal joints or musculature NEUROLOGIC: Cranial nerves 2-12 grossly intact. Moves all extremities PSYCHIATRIC: Mood and affect dementia, no behavioral issues  Patient Active Problem List   Diagnosis Date Noted  . Enterococcus UTI 04/29/2018  . CAP (community acquired pneumonia) 04/25/2018  . Acute encephalopathy 04/22/2018  . HCAP (healthcare-associated pneumonia) 04/22/2018  . Post-ictal state (HCC) 02/18/2018  . Urinary tract infection with hematuria   . Altered mental status   . Seizure (HCC) 02/11/2018  . Chronic constipation 02/09/2018  . Dementia 12/16/2017  . Failure to thrive in adult 12/16/2017  . Hypernatremia 12/16/2017  . Hypokalemia 12/16/2017  . Hypertension 12/16/2017  .  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 Hyperlipidemia 12/16/2017  . Osteoporosis 12/16/2017  . Vitamin B12 deficiency 12/16/2017  . Depression 12/16/2017    CMP     Component Value Date/Time   NA 142 04/25/2018 0642   NA 146 02/13/2018   K 3.5 04/25/2018 0642   CL 107 04/25/2018 0642   CO2 25 04/25/2018 0642   GLUCOSE 85 04/25/2018 0642   BUN <5 (L) 04/25/2018 0642   BUN 9 02/13/2018   CREATININE 0.58 04/25/2018 0642   CALCIUM 8.9 04/25/2018 0642   PROT 5.8 (L) 04/23/2018 0822   ALBUMIN 2.9 (L) 04/23/2018 0822   AST 23 04/23/2018 0822   ALT 10 04/23/2018 0822   ALKPHOS 73 04/23/2018 0822   BILITOT 1.2 04/23/2018 0822   GFRNONAA >60 04/25/2018 0642   GFRAA >60 04/25/2018 0642   Recent Labs    04/22/18 2034 04/22/18  2043 04/23/18 0822 04/25/18 0642  NA 145 147* 147* 142  K 3.2* 3.2* 4.5 3.5  CL 109 109 114* 107  CO2 25  --  27 25  GLUCOSE 163* 156* 95 85  BUN 13 14 10  <5*  CREATININE 0.88 0.80 0.61 0.58  CALCIUM 9.3  --  8.7* 8.9   Recent Labs    02/11/18 1325 04/22/18 2034 04/23/18 0822  AST 23 20 23   ALT 8* 11 10  ALKPHOS 78 77 73  BILITOT 0.9 0.9 1.2  PROT 5.7* 6.7 5.8*  ALBUMIN 3.0* 3.3* 2.9*   Recent Labs    10/22/17 1827  02/11/18 1325  02/13/18 1002 04/22/18 2034 04/22/18 2043 04/23/18 0822  WBC 3.9*   < > 7.5  7.5   < > 10.5 7.1  --  7.9  NEUTROABS 2.2  --  3.6  --   --  3.8  --   --   HGB 16.4*   < > 13.2  13.2   < > 13.4 13.5 13.6 13.1  HCT 48.4*   < > 41.2  41   < > 41.1 42.4 40.0 39.8  MCV 96.0  --  98.3   < > 97.9 96.8  --  93.4  PLT 120*   < > PLATELET CLUMPS NOTED ON SMEAR, COUNT APPEARS ADEQUATE   < > 130* 153  --  117*   < > = values in this interval not displayed.   Recent Labs    02/12/18 0521  CHOL 152  LDLCALC 99  TRIG 21   No results found for: [facility] Lab Results  Component Value Date   TSH 3.486 04/25/2018   Lab Results  Component Value Date   HGBA1C 4.2 (L) 02/12/2018   Lab Results  Component Value  Date   CHOL 152 02/12/2018   HDL 49 02/12/2018   LDLCALC 99 02/12/2018   TRIG 21 02/12/2018   CHOLHDL 3.1 02/12/2018    Significant Diagnostic Results in last 30 days:  Mr Brain Wo Contrast  Result Date: 04/23/2018 CLINICAL DATA:  Encephalopathy EXAM: MRI HEAD WITHOUT CONTRAST TECHNIQUE: Multiplanar, multiecho pulse sequences of the brain and surrounding structures were obtained without intravenous contrast. COMPARISON:  02/12/2018 FINDINGS: Brain: No acute infarction, hemorrhage, hydrocephalus, extra-axial collection or mass lesion. Atrophy, advanced in the medial temporal lobes. Advanced chronic small vessel ischemia with confluent gliosis in the deep cerebral white matter. Multiple remote micro hemorrhages clustered in the posterior superficial cerebrum. Vascular: Major flow voids are preserved Skull and upper cervical spine: No evidence of marrow lesion. Sinuses/Orbits: No acute finding IMPRESSION: 1. Significantly motion degraded exam without acute finding. 2. Advanced medial temporal atrophy in keeping with history of Alzheimer's disease. 3. Moderate to advanced chronic small vessel ischemia in the cerebral white matter. 4. Multiple remote micro hemorrhages clustered along posterior cerebral convexities, question previous trauma given this pattern. 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 Electronically Signed   By: Marnee SpringJonathon  Watts M.D.   On: 04/23/2018 21:52   Mr Laqueta JeanBrain W VHWo Contrast  Result Date: 04/24/2018 CLINICAL DATA:  History of mental status changes and possible seizure. EXAM: MRI HEAD WITHOUT AND WITH CONTRAST TECHNIQUE: Multiplanar, multiecho pulse sequences of the brain and surrounding structures were obtained without and with intravenous contrast. CONTRAST:  15mL MULTIHANCE GADOBENATE DIMEGLUMINE 529 MG/ML IV SOLN COMPARISON:  04/23/2018.  02/12/2018. FINDINGS: Brain: Fusion imaging does not show any acute or subacute infarction. There chronic small-vessel ischemic changes of the  pons. No large vessel  cerebellar insult. Scattered foci hemosiderin deposition throughout the cerebellum. Cerebral hemispheres show generalized atrophy with temporal predominance and advanced changes of small vessel disease throughout the deep and subcortical white matter. No large vessel territory infarction. No evidence of mass lesion, hydrocephalus or extra-axial collection. Widespread foci of hemosiderin deposition throughout the brain consistent with old microhemorrhages. Vascular: Major vessels at the base of the brain show flow. Skull and upper cervical spine: Negative Sinuses/Orbits: Clear/normal Other: None IMPRESSION: No change. No acute finding. Generalized atrophy with temporal predominance. Advanced chronic small-vessel ischemic changes throughout the brain. Multiple chronic microhemorrhages with hemosiderin deposition. Electronically Signed   By: Paulina Fusi M.D.   On: 04/24/2018 09:54   Dg Chest Port 1 View  Result Date: 04/22/2018 CLINICAL DATA:  Weakness. EXAM: PORTABLE CHEST 1 VIEW COMPARISON:  02/11/2018 FINDINGS: Airspace opacity newly seen at the right base. No Kerley lines, effusion, or pneumothorax. Normal heart size and mediastinal contours. IMPRESSION: Pneumonia on the right. Followup PA and lateral chest X-ray is recommended in 3-4 weeks following trial of antibiotic therapy to ensure resolution. Electronically Signed   By: Marnee Spring M.D.   On: 04/22/2018 22:31   Ct Head Code Stroke Wo Contrast  Result Date: 04/22/2018 CLINICAL DATA:  Code stroke.  77 y/o  F; decreased responsiveness. EXAM: CT HEAD WITHOUT CONTRAST TECHNIQUE: Contiguous axial images were obtained from the base of the skull through the vertex without intravenous contrast. COMPARISON:  02/12/2018 MRI head.  02/11/2017 CT head. FINDINGS: Brain: No evidence of acute infarction, hemorrhage, hydrocephalus, extra-axial collection or mass lesion/mass effect. Stable chronic microvascular ischemic changes and parenchymal  volume loss of the brain. Vascular: Calcific atherosclerosis of carotid siphons. No hyperdense vessel identified. Skull: Normal. Negative for fracture or focal lesion. Sinuses/Orbits: No acute finding. Other: None. ASPECTS [facility] Stroke Program Early CT Score) - Ganglionic level infarction (caudate, lentiform nuclei, internal capsule, insula, M1-M3 cortex): 7 - Supraganglionic infarction (M4-M6 cortex): 3 Total score (0-10 with 10 being normal): 10 IMPRESSION: 1. No acute intracranial abnormality identified. 2. Stable chronic microvascular ischemic changes and parenchymal volume loss of the brain. 3. ASPECTS is 10 These results were communicated to Dr. Laurence Slate at 8:57 pmon 7/7/2019by text page via the [facility] messaging system. Electronically Signed   By: Mitzi Hansen M.D.   On: 04/22/2018 20:57    Assessment and Plan  Hypertension Controlled; continue Norvasc 10 mg daily and hydralazine 50 mg twice daily  Hyperlipidemia LDL 99, HDL 49 on Pravachol 40 mg daily; to 80 mg daily  Osteoporosis Stable, continue Os-Cal plus D6 100-801 p.o. daily and vitamin D 1000 units daily plus Os-Cal chewable,  1 daily    Dereke Neumann D. Lyn Hollingshead, MD

## 2018-04-22 ENCOUNTER — Encounter (HOSPITAL_COMMUNITY): Payer: Self-pay | Admitting: Emergency Medicine

## 2018-04-22 ENCOUNTER — Emergency Department (HOSPITAL_COMMUNITY): Payer: Medicare Other

## 2018-04-22 ENCOUNTER — Inpatient Hospital Stay (HOSPITAL_COMMUNITY)
Admission: EM | Admit: 2018-04-22 | Discharge: 2018-04-26 | DRG: 689 | Disposition: A | Discharge status: Skilled Nursing Facility | Payer: Medicare Other | Attending: Internal Medicine | Admitting: Internal Medicine

## 2018-04-22 DIAGNOSIS — Z993 Dependence on wheelchair: Secondary | ICD-10-CM

## 2018-04-22 DIAGNOSIS — B964 Proteus (mirabilis) (morganii) as the cause of diseases classified elsewhere: Secondary | ICD-10-CM | POA: Diagnosis not present

## 2018-04-22 DIAGNOSIS — R4020 Unspecified coma: Secondary | ICD-10-CM | POA: Diagnosis not present

## 2018-04-22 DIAGNOSIS — Z87891 Personal history of nicotine dependence: Secondary | ICD-10-CM | POA: Diagnosis not present

## 2018-04-22 DIAGNOSIS — R569 Unspecified convulsions: Secondary | ICD-10-CM

## 2018-04-22 DIAGNOSIS — Z8673 Personal history of transient ischemic attack (TIA), and cerebral infarction without residual deficits: Secondary | ICD-10-CM

## 2018-04-22 DIAGNOSIS — Z880 Allergy status to penicillin: Secondary | ICD-10-CM | POA: Diagnosis not present

## 2018-04-22 DIAGNOSIS — Z7982 Long term (current) use of aspirin: Secondary | ICD-10-CM | POA: Diagnosis not present

## 2018-04-22 DIAGNOSIS — B952 Enterococcus as the cause of diseases classified elsewhere: Secondary | ICD-10-CM | POA: Diagnosis present

## 2018-04-22 DIAGNOSIS — Z79899 Other long term (current) drug therapy: Secondary | ICD-10-CM

## 2018-04-22 DIAGNOSIS — R1111 Vomiting without nausea: Secondary | ICD-10-CM | POA: Diagnosis not present

## 2018-04-22 DIAGNOSIS — N3001 Acute cystitis with hematuria: Secondary | ICD-10-CM

## 2018-04-22 DIAGNOSIS — E785 Hyperlipidemia, unspecified: Secondary | ICD-10-CM | POA: Diagnosis not present

## 2018-04-22 DIAGNOSIS — I10 Essential (primary) hypertension: Secondary | ICD-10-CM | POA: Diagnosis not present

## 2018-04-22 DIAGNOSIS — J181 Lobar pneumonia, unspecified organism: Secondary | ICD-10-CM | POA: Diagnosis present

## 2018-04-22 DIAGNOSIS — G9340 Encephalopathy, unspecified: Secondary | ICD-10-CM | POA: Diagnosis not present

## 2018-04-22 DIAGNOSIS — J189 Pneumonia, unspecified organism: Secondary | ICD-10-CM | POA: Diagnosis present

## 2018-04-22 DIAGNOSIS — N390 Urinary tract infection, site not specified: Secondary | ICD-10-CM | POA: Diagnosis not present

## 2018-04-22 DIAGNOSIS — R402441 Other coma, without documented Glasgow coma scale score, or with partial score reported, in the field [EMT or ambulance]: Secondary | ICD-10-CM | POA: Diagnosis not present

## 2018-04-22 DIAGNOSIS — R1110 Vomiting, unspecified: Secondary | ICD-10-CM | POA: Diagnosis not present

## 2018-04-22 DIAGNOSIS — R319 Hematuria, unspecified: Secondary | ICD-10-CM

## 2018-04-22 DIAGNOSIS — E162 Hypoglycemia, unspecified: Secondary | ICD-10-CM | POA: Diagnosis present

## 2018-04-22 DIAGNOSIS — F0280 Dementia in other diseases classified elsewhere without behavioral disturbance: Secondary | ICD-10-CM | POA: Diagnosis present

## 2018-04-22 DIAGNOSIS — F03C0 Unspecified dementia, severe, without behavioral disturbance, psychotic disturbance, mood disturbance, and anxiety: Secondary | ICD-10-CM | POA: Diagnosis present

## 2018-04-22 DIAGNOSIS — F0390 Unspecified dementia without behavioral disturbance: Secondary | ICD-10-CM | POA: Diagnosis present

## 2018-04-22 DIAGNOSIS — G309 Alzheimer's disease, unspecified: Secondary | ICD-10-CM | POA: Diagnosis not present

## 2018-04-22 DIAGNOSIS — R531 Weakness: Secondary | ICD-10-CM | POA: Diagnosis not present

## 2018-04-22 DIAGNOSIS — Y95 Nosocomial condition: Secondary | ICD-10-CM | POA: Diagnosis present

## 2018-04-22 LAB — COMPREHENSIVE METABOLIC PANEL
ALBUMIN: 3.3 g/dL — AB (ref 3.5–5.0)
ALK PHOS: 77 U/L (ref 38–126)
ALT: 11 U/L (ref 0–44)
ANION GAP: 11 (ref 5–15)
AST: 20 U/L (ref 15–41)
BUN: 13 mg/dL (ref 8–23)
CALCIUM: 9.3 mg/dL (ref 8.9–10.3)
CO2: 25 mmol/L (ref 22–32)
Chloride: 109 mmol/L (ref 98–111)
Creatinine, Ser: 0.88 mg/dL (ref 0.44–1.00)
GFR calc Af Amer: >60 mL/min (ref >60)
GFR calc non Af Amer: >60 mL/min (ref >60)
GLUCOSE: 163 mg/dL — AB (ref 70–99)
Potassium: 3.2 mmol/L — ABNORMAL LOW (ref 3.5–5.1)
SODIUM: 145 mmol/L (ref 135–145)
Total Bilirubin: 0.9 mg/dL (ref 0.3–1.2)
Total Protein: 6.7 g/dL (ref 6.5–8.1)

## 2018-04-22 LAB — I-STAT CHEM 8, ED
BUN: 14 mg/dL (ref 8–23)
CALCIUM ION: 1.19 mmol/L (ref 1.15–1.40)
CHLORIDE: 109 mmol/L (ref 98–111)
Creatinine, Ser: 0.8 mg/dL (ref 0.44–1.00)
Glucose, Bld: 156 mg/dL — ABNORMAL HIGH (ref 70–99)
HCT: 40 % (ref 36.0–46.0)
HEMOGLOBIN: 13.6 g/dL (ref 12.0–15.0)
Potassium: 3.2 mmol/L — ABNORMAL LOW (ref 3.5–5.1)
SODIUM: 147 mmol/L — AB (ref 135–145)
TCO2: 25 mmol/L (ref 22–32)

## 2018-04-22 LAB — CBC
HEMATOCRIT: 42.4 % (ref 36.0–46.0)
HEMOGLOBIN: 13.5 g/dL (ref 12.0–15.0)
MCH: 30.8 pg (ref 26.0–34.0)
MCHC: 31.8 g/dL (ref 30.0–36.0)
MCV: 96.8 fL (ref 78.0–100.0)
Platelets: 153 10*3/uL (ref 150–400)
RBC: 4.38 MIL/uL (ref 3.87–5.11)
RDW: 12.7 % (ref 11.5–15.5)
WBC: 7.1 10*3/uL (ref 4.0–10.5)

## 2018-04-22 LAB — URINALYSIS, ROUTINE W REFLEX MICROSCOPIC
BACTERIA UA: NONE SEEN
BILIRUBIN URINE: NEGATIVE
Glucose, UA: NEGATIVE mg/dL
HGB URINE DIPSTICK: NEGATIVE
Ketones, ur: 5 mg/dL — AB
NITRITE: NEGATIVE
PROTEIN: 30 mg/dL — AB
SPECIFIC GRAVITY, URINE: 1.012 (ref 1.005–1.030)
pH: 6 (ref 5.0–8.0)

## 2018-04-22 LAB — DIFFERENTIAL
Abs Immature Granulocytes: 0 10*3/uL (ref 0.0–0.1)
BASOS PCT: 1 %
Basophils Absolute: 0 10*3/uL (ref 0.0–0.1)
EOS ABS: 0.1 10*3/uL (ref 0.0–0.7)
Eosinophils Relative: 2 %
Immature Granulocytes: 0 %
LYMPHS ABS: 2.6 10*3/uL (ref 0.7–4.0)
Lymphocytes Relative: 37 %
MONO ABS: 0.4 10*3/uL (ref 0.1–1.0)
MONOS PCT: 6 %
NEUTROS PCT: 54 %
Neutro Abs: 3.8 10*3/uL (ref 1.7–7.7)

## 2018-04-22 LAB — PROTIME-INR
INR: 1.18
PROTHROMBIN TIME: 14.9 s (ref 11.4–15.2)

## 2018-04-22 LAB — RAPID URINE DRUG SCREEN, HOSP PERFORMED
AMPHETAMINES: NOT DETECTED
BENZODIAZEPINES: NOT DETECTED
COCAINE: NOT DETECTED
Opiates: NOT DETECTED
TETRAHYDROCANNABINOL: NOT DETECTED

## 2018-04-22 LAB — I-STAT TROPONIN, ED: Troponin i, poc: 0 ng/mL (ref 0.00–0.08)

## 2018-04-22 LAB — ETHANOL

## 2018-04-22 LAB — APTT: aPTT: 25 seconds (ref 24–36)

## 2018-04-22 LAB — LIPASE, BLOOD: Lipase: 38 U/L (ref 11–51)

## 2018-04-22 MED ORDER — LEVETIRACETAM IN NACL 500 MG/100ML IV SOLN
500.0000 mg | Freq: Two times a day (BID) | INTRAVENOUS | Status: DC
  Administered 2018-04-23 – 2018-04-24 (×3): 500 mg via INTRAVENOUS
  Filled 2018-04-22 (×5): qty 100

## 2018-04-22 MED ORDER — ACETAMINOPHEN 160 MG/5ML PO SOLN: 650.0000 mg | ORAL | Status: DC | PRN

## 2018-04-22 MED ORDER — SODIUM CHLORIDE 0.9 % IV SOLN
INTRAVENOUS | Status: AC
  Administered 2018-04-22: via INTRAVENOUS

## 2018-04-22 MED ORDER — ASPIRIN 325 MG PO TABS
325.0000 mg | ORAL_TABLET | Freq: Every day | ORAL | Status: DC
  Administered 2018-04-24 – 2018-04-26 (×3): 325 mg via ORAL
  Filled 2018-04-22 (×3): qty 1

## 2018-04-22 MED ORDER — HYDRALAZINE HCL 20 MG/ML IJ SOLN
10.0000 mg | INTRAMUSCULAR | Status: DC | PRN
  Administered 2018-04-25: 10 mg via INTRAVENOUS
  Filled 2018-04-22: qty 1

## 2018-04-22 MED ORDER — ACETAMINOPHEN 325 MG PO TABS: 650.0000 mg | ORAL_TABLET | ORAL | Status: DC | PRN

## 2018-04-22 MED ORDER — SODIUM CHLORIDE 0.9 % IV BOLUS
500.0000 mL | Freq: Once | INTRAVENOUS | Status: AC
  Administered 2018-04-22: 500 mL via INTRAVENOUS

## 2018-04-22 MED ORDER — LEVETIRACETAM IN NACL 1000 MG/100ML IV SOLN
1000.0000 mg | Freq: Once | INTRAVENOUS | Status: AC
  Administered 2018-04-22: 1000 mg via INTRAVENOUS
  Filled 2018-04-22: qty 100

## 2018-04-22 MED ORDER — BISACODYL 10 MG RE SUPP: 10.0000 mg | Freq: Every day | RECTAL | Status: DC | PRN

## 2018-04-22 MED ORDER — VANCOMYCIN HCL IN DEXTROSE 750-5 MG/150ML-% IV SOLN
750.0000 mg | Freq: Two times a day (BID) | INTRAVENOUS | Status: DC
  Administered 2018-04-23 (×2): 750 mg via INTRAVENOUS
  Filled 2018-04-22 (×3): qty 150

## 2018-04-22 MED ORDER — SODIUM CHLORIDE 0.9 % IV SOLN
2.0000 g | Freq: Three times a day (TID) | INTRAVENOUS | Status: DC
  Administered 2018-04-23 (×3): 2 g via INTRAVENOUS
  Filled 2018-04-22 (×3): qty 2

## 2018-04-22 MED ORDER — ACETAMINOPHEN 650 MG RE SUPP: 650.0000 mg | RECTAL | Status: DC | PRN

## 2018-04-22 MED ORDER — CIPROFLOXACIN IN D5W 400 MG/200ML IV SOLN: 400.0000 mg | Freq: Once | INTRAVENOUS | Status: DC

## 2018-04-22 MED ORDER — ASPIRIN 300 MG RE SUPP
300.0000 mg | Freq: Every day | RECTAL | Status: DC
  Administered 2018-04-23: 300 mg via RECTAL
  Filled 2018-04-22 (×2): qty 1

## 2018-04-22 MED ORDER — LEVOFLOXACIN IN D5W 500 MG/100ML IV SOLN
500.0000 mg | Freq: Once | INTRAVENOUS | Status: AC
  Administered 2018-04-22: 500 mg via INTRAVENOUS
  Filled 2018-04-22: qty 100

## 2018-04-22 MED ORDER — ENOXAPARIN SODIUM 40 MG/0.4ML ~~LOC~~ SOLN
40.0000 mg | SUBCUTANEOUS | Status: DC
  Administered 2018-04-23 – 2018-04-26 (×4): 40 mg via SUBCUTANEOUS
  Filled 2018-04-22 (×5): qty 0.4

## 2018-04-22 NOTE — Progress Notes (Addendum)
Pharmacy Antibiotic Note  Melissa ChiquitoBarbara S Jarvis is a 77 y.o. female admitted on 04/22/2018 with pneumonia.  Pharmacy has been consulted for vancomycin dosing.  Plan: Vancomycin 750mg  IV q12h  Aztreonam 2g IV q8h per MD Follow c/s, clinical progression, renal function, level PRN   Height: 5\' 4"  (162.6 cm) Weight: 170 lb (77.1 kg) IBW/kg (Calculated) : 54.7  Temp (24hrs), Avg:96.2 F (35.7 C), Min:96.2 F (35.7 C), Max:96.2 F (35.7 C)  Recent Labs  Lab 04/22/18 2034 04/22/18 2043  WBC 7.1  --   CREATININE 0.88 0.80    Estimated Creatinine Clearance: 59.2 mL/min (by C-G formula based on SCr of 0.8 mg/dL).    Allergies  Allergen Reactions  . Penicillins Other (See Comments)    Other reaction(s): UNKNOWN REACTION Other reaction(s): UNKNOWN REACTION     Antimicrobials this admission: Vancomycin 7/7 >>  Aztreonam 7/7>> Levofloxacin 7/7 x1  Dose adjustments this admission: n/a  Microbiology results: 7/7 UCx:   7/7 Sputum:    Thank you for allowing pharmacy to be a part of this patient's care.  Stephie Xu D. Taariq Leitz, PharmD, BCPS Clinical Pharmacist [phone] Please check AMION for all [facility] numbers 04/22/2018 11:29 PM

## 2018-04-22 NOTE — Consult Note (Signed)
Requesting Physician: Dr. Estell Harpin    Chief Complaint: AMS  History obtained from: Patient and Chart     HPI:                                                                                                                                       Melissa Jarvis is an 77 y.o. female past medical history of advanced dementia and nursing home, hypertension, hyperlipidemia, CVA seizures on Keppra presents to the emergency room after being found confused and vomiting following dinner tonight around 7 PM.  On assessment patient has no focal weakness but appears lethargic.  Stat CT head was obtained which showed no evidence of hemorrhage.  TPA was not administered due to low suspicion of stroke and patient's poor functional baseline given her advanced dementia being wheelchair-bound.  No obvious seizure-like activity witnessed in the nursing home.  Work-up in ER shows urinary tract infection.   Past Medical History:  Diagnosis Date  . Alzheimer's disease   . Dementia   . Depressed   . Hyperlipidemia   . Hypertension   . Low back pain   . Migraines   . Osteoarthritis     Past Surgical History:  Procedure Laterality Date  . ABDOMINAL HYSTERECTOMY    . CHOLECYSTECTOMY    . KNEE SURGERY     total    Family History  Problem Relation Age of Onset  . Hypertension Mother    Social History:  reports that she has quit smoking. She has never used smokeless tobacco. She reports that she does not drink alcohol or use drugs.  Allergies:  Allergies  Allergen Reactions  . Penicillins Other (See Comments)    Other reaction(s): UNKNOWN REACTION Other reaction(s): UNKNOWN REACTION     Medications:                                                                                                                        I reviewed home medications   ROS:  14 systems  reviewed and negative except above    Examination:                                                                                                      General: Appears well-developed and well-nourished.  Psych: Affect appropriate to situation Eyes: No scleral injection HENT: No OP obstrucion Head: Normocephalic.  Cardiovascular: Normal rate and regular rhythm.  Respiratory: Effort normal and breath sounds normal to anterior ascultation GI: Soft.  No distension. There is no tenderness.  Skin: WDI    Neurological Examination Mental Status: Drowsy, answers name on repeated questioning.  Does follow simple commands. Cranial Nerves: II: Visual fields : Difficult to assess with forcibly close her eyes III,IV, VI: ptosis not present, V,VII: smile symmetric VIII: unable to assess IX,X: unable to assess XI: unable to assess XII: unable to assess Motor: Right : Upper extremity   4/5    Left:     Upper extremity   4/5  Lower extremity   3/5     Lower extremity   3/5 Tone and bulk:normal tone throughout; no atrophy noted Sensory: withdraws to noxious stimulus Deep Tendon Reflexes: symmetric throughout Plantars: Right: downgoing   Left: downgoing Cerebellar: Unable to assess Gait: unable to assess  Lab Results: Basic Metabolic Panel: Recent Labs  Lab 04/22/18 2034 04/22/18 2043  NA 145 147*  K 3.2* 3.2*  CL 109 109  CO2 25  --   GLUCOSE 163* 156*  BUN 13 14  CREATININE 0.88 0.80  CALCIUM 9.3  --     CBC: Recent Labs  Lab 04/22/18 2034 04/22/18 2043  WBC 7.1  --   NEUTROABS 3.8  --   HGB 13.5 13.6  HCT 42.4 40.0  MCV 96.8  --   PLT 153  --     Coagulation Studies: Recent Labs    04/22/18 2034  LABPROT 14.9  INR 1.18    Imaging: Dg Chest Port 1 View  Result Date: 04/22/2018 CLINICAL DATA:  Weakness. EXAM: PORTABLE CHEST 1 VIEW COMPARISON:  02/11/2018 FINDINGS: Airspace opacity newly seen at the right base. No Kerley lines, effusion, or pneumothorax.  Normal heart size and mediastinal contours. IMPRESSION: Pneumonia on the right. Followup PA and lateral chest X-ray is recommended in 3-4 weeks following trial of antibiotic therapy to ensure resolution. Electronically Signed   By: Marnee SpringJonathon  Watts M.D.   On: 04/22/2018 22:31   Ct Head Code Stroke Wo Contrast  Result Date: 04/22/2018 CLINICAL DATA:  Code stroke.  77 y/o  F; decreased responsiveness. EXAM: CT HEAD WITHOUT CONTRAST TECHNIQUE: Contiguous axial images were obtained from the base of the skull through the vertex without intravenous contrast. COMPARISON:  02/12/2018 MRI head.  02/11/2017 CT head. FINDINGS: Brain: No evidence of acute infarction, hemorrhage, hydrocephalus, extra-axial collection or mass lesion/mass effect. Stable chronic microvascular ischemic changes and parenchymal volume loss of the brain. Vascular: Calcific atherosclerosis of carotid siphons. No hyperdense vessel identified. Skull: Normal. Negative for fracture or focal lesion. Sinuses/Orbits: No acute finding. Other: None. ASPECTS [facility]([place] Stroke Program Early CT Score) -  Ganglionic level infarction (caudate, lentiform nuclei, internal capsule, insula, M1-M3 cortex): 7 - Supraganglionic infarction (M4-M6 cortex): 3 Total score (0-10 with 10 being normal): 10 IMPRESSION: 1. No acute intracranial abnormality identified. 2. Stable chronic microvascular ischemic changes and parenchymal volume loss of the brain. 3. ASPECTS is 10 These results were communicated to Dr. Laurence Slate at 8:57 pmon 7/7/2019by text page via the [facility] messaging system. Electronically Signed   By: Mitzi Hansen M.D.   On: 04/22/2018 20:57     ASSESSMENT AND PLAN   77 y.o. 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 CT head is negative.  Suspect she possibly had a seizure, reduced seizure threshold in the setting of urinary tract  infection.  Possible seizure Encephalopathy Urinary tract infection  Recommendations Continue Keppra at current dose Treat UTI No need for MRI Brain if patient returns to baseline by tomorrow Seizure precautions   Sushanth Aroor [facility] Pager Number [phone]

## 2018-04-22 NOTE — H&P (Signed)
History and Physical    Melissa Jarvis ZOX:[id number] DOB: 1940/12/24 DOA: 04/22/2018  PCP: Patient, No Pcp Per  Patient coming from: Skilled nursing facility.  History obtained from previous chart and ER physician.  No family at the bedside and patient is encephalopathic and has dementia.  Chief Complaint: Confusion and nausea vomiting.  HPI: Melissa Jarvis is a 77 y.o. female with history of dementia, hypertension, recently admitted 2 months ago for change in mental status attributed to possible new onset seizure and was placed on Keppra was found to be confused and throwing up after her dinner last night.  Patient was brought to the ER.  There was no complaint of any fever chills diarrhea chest pain or shortness of breath.  ED Course: In the ER patient appeared to be confused CT head is unremarkable neurologist on-call was consulted and at this time to have lower with Keppra 1 g and admitted for further observation.  On my exam patient is oriented to name only.  Moves all extremities.  UA is consistent with UTI chest x-ray shows possible pneumonia.  Review of Systems: As per HPI, rest all negative.   Past Medical History:  Diagnosis Date  . Alzheimer's disease   . Dementia   . Depressed   . Hyperlipidemia   . Hypertension   . Low back pain   . Migraines   . Osteoarthritis     Past Surgical History:  Procedure Laterality Date  . ABDOMINAL HYSTERECTOMY    . CHOLECYSTECTOMY    . KNEE SURGERY     total     reports that she has quit smoking. She has never used smokeless tobacco. She reports that she does not drink alcohol or use drugs.  Allergies  Allergen Reactions  . Penicillins Other (See Comments)    Other reaction(s): UNKNOWN REACTION Other reaction(s): UNKNOWN REACTION     Family History  Problem Relation Age of Onset  . Hypertension Mother     Prior to Admission medications   Medication Sig Start Date End Date Taking? Authorizing Provider  amLODipine  (NORVASC) 10 MG tablet Take 10 mg by mouth daily.   Yes [provider]  aspirin (ASPIRIN EC) 81 MG EC tablet Take 81 mg by mouth daily. Swallow whole.    Yes [provider]  bisacodyl (DULCOLAX) 10 MG suppository Place 10 mg rectally daily as needed for moderate constipation (if not relieved by MOM).    Yes [provider]  bisacodyl (FLEET) 10 MG/30ML ENEM Place 10 mg rectally daily as needed (if not relieved by Bisacodyl suppository).    Yes [provider]  Calcium Carb-Cholecalciferol 600-800 MG-UNIT TABS Take 1 tablet by mouth every morning.   Yes [provider]  calcium carbonate (OS-CAL) 1250 (500 Ca) MG chewable tablet Chew 1 tablet by mouth daily.   Yes [provider]  Cholecalciferol (D3-1000) 1000 units tablet Take 1,000 Units by mouth daily.   Yes [provider]  Dextromethorphan-quiNIDine (NUEDEXTA) 20-10 MG CAPS Take 1 capsule by mouth daily.    Yes [provider]  hydrALAZINE (APRESOLINE) 50 MG tablet Take 50 mg by mouth 2 (two) times daily.   Yes [provider]  levETIRAcetam (KEPPRA) 100 MG/ML solution Take 5 mLs (500 mg total) by mouth 2 (two) times daily. 02/14/18  Yes Talbert Forest, [place], DO  Magnesium Hydroxide (MILK OF MAGNESIA PO) Take 30 mLs by mouth daily as needed (if no BM in 3 days).    Yes  [provider]  memantine (NAMENDA XR) 28 MG CP24 24 hr capsule Take 28 mg by mouth at bedtime.    Yes [provider]  NUTRITIONAL SUPPLEMENTS PO Initiate 120 ml Medpass 3 times daily with meals related to weight loss   Yes [provider]  Omega-3 Fatty Acids (FISH OIL) 1000 MG CAPS Take 1 capsule by mouth every morning.   Yes [provider]  polyethylene glycol (MIRALAX / GLYCOLAX) packet Take 17 g by mouth daily.   Yes [provider]  pravastatin (PRAVACHOL) 40 MG tablet Take 40 mg by mouth daily.   Yes [provider]  rivastigmine (EXELON) 6 MG  capsule Take 6 mg by mouth 2 (two) times daily.    Yes [provider]  vitamin B-12 (CYANOCOBALAMIN) 1000 MCG tablet Take 1,000 mcg by mouth daily.   Yes [provider]    Physical Exam: Vitals:   04/22/18 2215 04/22/18 2230 04/22/18 2245 04/22/18 2300  BP: 138/68 133/69 140/69 126/74  Pulse: 66 67 70 69  Resp: 17 14 13 13   Temp:      TempSrc:      SpO2: 98% 97% 96% 96%  Weight:      Height:          Constitutional: Moderately built and nourished. Vitals:   04/22/18 2215 04/22/18 2230 04/22/18 2245 04/22/18 2300  BP: 138/68 133/69 140/69 126/74  Pulse: 66 67 70 69  Resp: 17 14 13 13   Temp:      TempSrc:      SpO2: 98% 97% 96% 96%  Weight:      Height:       Eyes: Anicteric no pallor. ENMT: No discharge from the ears eyes nose or mouth. Neck: No mass felt.  No neck rigidity no JVD appreciated. Respiratory: No rhonchi or crepitation. Cardiovascular: S1-S2 heard no murmurs appreciated. Abdomen: Soft nontender bowel sounds present. Musculoskeletal: No edema. Skin: No rash. Neurologic: Alert awake oriented to name.  Moves all extremities.  Does not follow commands.  Pupils are reacting to light. Psychiatric: Patient is demented.   Labs on Admission: I have personally reviewed following labs and imaging studies  CBC: Recent Labs  Lab 04/22/18 2034 04/22/18 2043  WBC 7.1  --   NEUTROABS 3.8  --   HGB 13.5 13.6  HCT 42.4 40.0  MCV 96.8  --   PLT 153  --    Basic Metabolic Panel: Recent Labs  Lab 04/22/18 2034 04/22/18 2043  NA 145 147*  K 3.2* 3.2*  CL 109 109  CO2 25  --   GLUCOSE 163* 156*  BUN 13 14  CREATININE 0.88 0.80  CALCIUM 9.3  --    GFR: Estimated Creatinine Clearance: 59.2 mL/min (by C-G formula based on SCr of 0.8 mg/dL). Liver Function Tests: Recent Labs  Lab 04/22/18 2034  AST 20  ALT 11  ALKPHOS 77  BILITOT 0.9  PROT 6.7  ALBUMIN 3.3*   Recent Labs  Lab 04/22/18 2034  LIPASE 38   No results for  input(s): AMMONIA in the last 168 hours. Coagulation Profile: Recent Labs  Lab 04/22/18 2034  INR 1.18   Cardiac Enzymes: No results for input(s): CKTOTAL, CKMB, CKMBINDEX, TROPONINI in the last 168 hours. BNP (last 3 results) No results for input(s): PROBNP in the last 8760 hours. HbA1C: No results for input(s): HGBA1C in the last 72 hours. CBG: No results for input(s): GLUCAP in the last 168 hours. Lipid Profile: No results  for input(s): CHOL, HDL, LDLCALC, TRIG, CHOLHDL, LDLDIRECT in the last 72 hours. Thyroid Function Tests: No results for input(s): TSH, T4TOTAL, FREET4, T3FREE, THYROIDAB in the last 72 hours. Anemia Panel: No results for input(s): VITAMINB12, FOLATE, FERRITIN, TIBC, IRON, RETICCTPCT in the last 72 hours. Urine analysis:    Component Value Date/Time   COLORURINE YELLOW 04/22/2018 2159   APPEARANCEUR HAZY (A) 04/22/2018 2159   LABSPEC 1.012 04/22/2018 2159   PHURINE 6.0 04/22/2018 2159   GLUCOSEU NEGATIVE 04/22/2018 2159   HGBUR NEGATIVE 04/22/2018 2159   BILIRUBINUR NEGATIVE 04/22/2018 2159   KETONESUR 5 (A) 04/22/2018 2159   PROTEINUR 30 (A) 04/22/2018 2159   UROBILINOGEN 1.0 12/16/2014 1330   NITRITE NEGATIVE 04/22/2018 2159   LEUKOCYTESUR LARGE (A) 04/22/2018 2159   Sepsis Labs: @LABRCNTIP (procalcitonin:4,lacticidven:4) )No results found for this or any previous visit (from the past 240 hour(s)).   Radiological Exams on Admission: Dg Chest Port 1 View  Result Date: 04/22/2018 CLINICAL DATA:  Weakness. EXAM: PORTABLE CHEST 1 VIEW COMPARISON:  02/11/2018 FINDINGS: Airspace opacity newly seen at the right base. No Kerley lines, effusion, or pneumothorax. Normal heart size and mediastinal contours. IMPRESSION: Pneumonia on the right. Followup PA and lateral chest X-ray is recommended in 3-4 weeks following trial of antibiotic therapy to ensure resolution. Electronically Signed   By: Marnee SpringJonathon  Watts M.D.   On: 04/22/2018 22:31   Ct Head Code Stroke Wo  Contrast  Result Date: 04/22/2018 CLINICAL DATA:  Code stroke.  77 y/o  F; decreased responsiveness. EXAM: CT HEAD WITHOUT CONTRAST TECHNIQUE: Contiguous axial images were obtained from the base of the skull through the vertex without intravenous contrast. COMPARISON:  02/12/2018 MRI head.  02/11/2017 CT head. FINDINGS: Brain: No evidence of acute infarction, hemorrhage, hydrocephalus, extra-axial collection or mass lesion/mass effect. Stable chronic microvascular ischemic changes and parenchymal volume loss of the brain. Vascular: Calcific atherosclerosis of carotid siphons. No hyperdense vessel identified. Skull: Normal. Negative for fracture or focal lesion. Sinuses/Orbits: No acute finding. Other: None. ASPECTS [facility]([place] Stroke Program Early CT Score) - Ganglionic level infarction (caudate, lentiform nuclei, internal capsule, insula, M1-M3 cortex): 7 - Supraganglionic infarction (M4-M6 cortex): 3 Total score (0-10 with 10 being normal): 10 IMPRESSION: 1. No acute intracranial abnormality identified. 2. Stable chronic microvascular ischemic changes and parenchymal volume loss of the brain. 3. ASPECTS is 10 These results were communicated to Dr. Laurence SlateAroor at 8:57 pmon 7/7/2019by text page via the [facility] messaging system. Electronically Signed   By: Mitzi HansenLance  Furusawa-Stratton M.D.   On: 04/22/2018 20:57    EKG: Independently reviewed.  Normal sinus rhythm.  Assessment/Plan Principal Problem:   Acute encephalopathy Active Problems:   Dementia   Seizure (HCC)   Urinary tract infection with hematuria   HCAP (healthcare-associated pneumonia)    1. Acute encephalopathy likely from postictal state.  Discussed with Dr. Laurence SlateAroor on-call neurologist who at this time advised not to increase patient's Keppra since patient's seizure likely precipitated due to infectious source including UTI or pneumonia.  Closely observe.  If mental status does not improve in the morning may need MRI brain. 2. UTI and pneumonia on  empiric antibiotics.  Follow cultures.  Get speech therapy evaluation. 3. Hypertension we will keep patient on PRN IV hydralazine since patient is n.p.o. and is awaiting speech therapist evaluation. 4. History of dementia.   DVT prophylaxis: Lovenox. Code Status: Code. Family Communication: Discussed with patient Disposition Plan: Skilled nursing facility. Consults called: Neurologist. Admission status: Observation.   Eduard ClosArshad N Kakrakandy MD  [facility] Pager [phone].  If 7PM-7AM, please contact night-coverage [email or web address] Password [facility]  04/22/2018, 11:27 PM

## 2018-04-22 NOTE — ED Notes (Signed)
Code Stroke canceled per Neuro Physician

## 2018-04-22 NOTE — ED Triage Notes (Addendum)
[facility]. C/O AMS & N/V. Pt went to dinner @ 1900 and became less responsive once returning to her room. Received 4mg  Zofran in route.

## 2018-04-22 NOTE — ED Provider Notes (Signed)
[facility] EMERGENCY DEPARTMENT Provider Note   CSN: [id number] Arrival date & time: 04/22/18  2003     History   Chief Complaint Chief Complaint  Patient presents with  . Altered Mental Status  . Emesis    HPI Melissa Jarvis is a 77 y.o. female.  Patient was at the nursing home and became confused and less responsive.  This happened to her once before when she had a seizure.  Patient has significant Alzheimer's disease  The history is provided by a relative. No language interpreter was used.  Altered Mental Status   This is a recurrent problem. The current episode started less than 1 hour ago. The problem has not changed since onset.Associated symptoms include confusion. Risk factors: Seizures. Her past medical history is significant for seizures.  Emesis      Past Medical History:  Diagnosis Date  . Alzheimer's disease   . Dementia   . Depressed   . Hyperlipidemia   . Hypertension   . Low back pain   . Migraines   . Osteoarthritis     Patient Active Problem List   Diagnosis Date Noted  . Acute encephalopathy 04/22/2018  . Post-ictal state (HCC) 02/18/2018  . Urinary tract infection with hematuria   . Altered mental status   . Seizure (HCC) 02/11/2018  . Chronic constipation 02/09/2018  . Dementia 12/16/2017  . Failure to thrive in adult 12/16/2017  . Hypernatremia 12/16/2017  . Hypokalemia 12/16/2017  . Hypertension 12/16/2017  . 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 Hyperlipidemia 12/16/2017  . Osteoporosis 12/16/2017  . Vitamin B12 deficiency 12/16/2017  . Depression 12/16/2017    Past Surgical History:  Procedure Laterality Date  . ABDOMINAL HYSTERECTOMY    . CHOLECYSTECTOMY    . KNEE SURGERY     total     OB History    Gravida  2   Para  2   Term  2   Preterm      AB      Living        SAB      TAB      Ectopic      Multiple      Live Births               Home Medications    Prior to  Admission medications   Medication Sig Start Date End Date Taking? Authorizing Provider  amLODipine (NORVASC) 10 MG tablet Take 10 mg by mouth daily.   Yes [provider]  aspirin (ASPIRIN EC) 81 MG EC tablet Take 81 mg by mouth daily. Swallow whole.    Yes [provider]  bisacodyl (DULCOLAX) 10 MG suppository Place 10 mg rectally daily as needed for moderate constipation (if not relieved by MOM).    Yes [provider]  bisacodyl (FLEET) 10 MG/30ML ENEM Place 10 mg rectally daily as needed (if not relieved by Bisacodyl suppository).    Yes [provider]  Calcium Carb-Cholecalciferol 600-800 MG-UNIT TABS Take 1 tablet by mouth every morning.   Yes [provider]  calcium carbonate (OS-CAL) 1250 (500 Ca) MG chewable tablet Chew 1 tablet by mouth daily.   Yes [provider]  Cholecalciferol (D3-1000) 1000 units tablet Take 1,000 Units by mouth daily.   Yes [provider]  Dextromethorphan-quiNIDine (NUEDEXTA) 20-10 MG CAPS Take 1 capsule by mouth daily.    Yes [provider]  hydrALAZINE (APRESOLINE) 50 MG tablet Take 50 mg by  mouth 2 (two) times daily.   Yes [provider]  levETIRAcetam (KEPPRA) 100 MG/ML solution Take 5 mLs (500 mg total) by mouth 2 (two) times daily. 02/14/18  Yes Talbert ForestShirley, [place], DO  Magnesium Hydroxide (MILK OF MAGNESIA PO) Take 30 mLs by mouth daily as needed (if no BM in 3 days).    Yes [provider]  memantine (NAMENDA XR) 28 MG CP24 24 hr capsule Take 28 mg by mouth at bedtime.    Yes [provider]  NUTRITIONAL SUPPLEMENTS PO Initiate 120 ml Medpass 3 times daily with meals related to weight loss   Yes [provider]  Omega-3 Fatty Acids (FISH OIL) 1000 MG CAPS Take 1 capsule by mouth every morning.   Yes [provider]  polyethylene glycol (MIRALAX / GLYCOLAX) packet Take 17 g by mouth daily.   Yes [provider]  pravastatin  (PRAVACHOL) 40 MG tablet Take 40 mg by mouth daily.   Yes [provider]  rivastigmine (EXELON) 6 MG capsule Take 6 mg by mouth 2 (two) times daily.    Yes [provider]  vitamin B-12 (CYANOCOBALAMIN) 1000 MCG tablet Take 1,000 mcg by mouth daily.   Yes [provider]    Family History Family History  Problem Relation Age of Onset  . Hypertension Mother     Social History Social History   Tobacco Use  . Smoking status: Former Games developermoker  . Smokeless tobacco: Never Used  Substance Use Topics  . Alcohol use: No  . Drug use: No     Allergies   Penicillins   Review of Systems Review of Systems  Unable to perform ROS: Dementia  Gastrointestinal: Positive for vomiting.  Psychiatric/Behavioral: Positive for confusion.     Physical Exam Updated Vital Signs BP 140/62   Pulse 63   Temp (!) 96.2 F (35.7 C) (Rectal)   Resp 13   Ht 5\' 4"  (1.626 m)   Wt 77.1 kg (170 lb)   SpO2 95%   BMI 29.18 kg/m   Physical Exam  Constitutional: She appears well-developed.  HENT:  Head: Normocephalic.  Eyes: Conjunctivae and EOM are normal. No scleral icterus.  Neck: Neck supple. No thyromegaly present.  Cardiovascular: Normal rate and regular rhythm. Exam reveals no gallop and no friction rub.  No murmur heard. Pulmonary/Chest: No stridor. She has no wheezes. She has no rales. She exhibits no tenderness.  Abdominal: She exhibits no distension. There is no tenderness. There is no rebound.  Musculoskeletal: Normal range of motion. She exhibits no edema.  Lymphadenopathy:    She has no cervical adenopathy.  Neurological: She exhibits normal muscle tone. Coordination normal.  Responds to painful stimuli only  Skin: No rash noted. No erythema.     ED Treatments / Results  Labs (all labs ordered are listed, but only abnormal results are displayed) Labs Reviewed  COMPREHENSIVE METABOLIC PANEL - Abnormal; Notable for the following components:      Result  Value   Potassium 3.2 (*)    Glucose, Bld 163 (*)    Albumin 3.3 (*)    All other components within normal limits  URINALYSIS, ROUTINE W REFLEX MICROSCOPIC - Abnormal; Notable for the following components:   APPearance HAZY (*)    Ketones, ur 5 (*)    Protein, ur 30 (*)    Leukocytes, UA LARGE (*)    WBC, UA >50 (*)    All other components within normal limits  RAPID URINE DRUG SCREEN, HOSP  PERFORMED - Abnormal; Notable for the following components:   Barbiturates   (*)    Value: Result not available. Reagent lot number recalled by manufacturer.   All other components within normal limits  I-STAT CHEM 8, ED - Abnormal; Notable for the following components:   Sodium 147 (*)    Potassium 3.2 (*)    Glucose, Bld 156 (*)    All other components within normal limits  URINE CULTURE  LIPASE, BLOOD  CBC  ETHANOL  PROTIME-INR  APTT  DIFFERENTIAL  I-STAT TROPONIN, ED  CBG MONITORING, ED    EKG None  Radiology Dg Chest Port 1 View  Result Date: 04/22/2018 CLINICAL DATA:  Weakness. EXAM: PORTABLE CHEST 1 VIEW COMPARISON:  02/11/2018 FINDINGS: Airspace opacity newly seen at the right base. No Kerley lines, effusion, or pneumothorax. Normal heart size and mediastinal contours. IMPRESSION: Pneumonia on the right. Followup PA and lateral chest X-ray is recommended in 3-4 weeks following trial of antibiotic therapy to ensure resolution. Electronically Signed   By: Marnee Spring M.D.   On: 04/22/2018 22:31   Ct Head Code Stroke Wo Contrast  Result Date: 04/22/2018 CLINICAL DATA:  Code stroke.  77 y/o  F; decreased responsiveness. EXAM: CT HEAD WITHOUT CONTRAST TECHNIQUE: Contiguous axial images were obtained from the base of the skull through the vertex without intravenous contrast. COMPARISON:  02/12/2018 MRI head.  02/11/2017 CT head. FINDINGS: Brain: No evidence of acute infarction, hemorrhage, hydrocephalus, extra-axial collection or mass lesion/mass effect. Stable chronic microvascular  ischemic changes and parenchymal volume loss of the brain. Vascular: Calcific atherosclerosis of carotid siphons. No hyperdense vessel identified. Skull: Normal. Negative for fracture or focal lesion. Sinuses/Orbits: No acute finding. Other: None. ASPECTS [facility] Stroke Program Early CT Score) - Ganglionic level infarction (caudate, lentiform nuclei, internal capsule, insula, M1-M3 cortex): 7 - Supraganglionic infarction (M4-M6 cortex): 3 Total score (0-10 with 10 being normal): 10 IMPRESSION: 1. No acute intracranial abnormality identified. 2. Stable chronic microvascular ischemic changes and parenchymal volume loss of the brain. 3. ASPECTS is 10 These results were communicated to Dr. Laurence Slate at 8:57 pmon 7/7/2019by text page via the [facility] messaging system. Electronically Signed   By: Mitzi Hansen M.D.   On: 04/22/2018 20:57    Procedures Procedures (including critical care time)  Medications Ordered in ED Medications  levofloxacin (LEVAQUIN) IVPB 500 mg (has no administration in time range)  levETIRAcetam (KEPPRA) IVPB 1000 mg/100 mL premix (0 mg Intravenous Stopped 04/22/18 2125)  sodium chloride 0.9 % bolus 500 mL (0 mLs Intravenous Stopped 04/22/18 2212)     Initial Impression / Assessment and Plan / ED Course  I have reviewed the triage vital signs and the nursing notes.  Pertinent labs & imaging results that were available during my care of the patient were reviewed by me and considered in my medical decision making (see chart for details).     CRITICAL CARE Performed by: Bethann Berkshire Total critical care time: 40 minutes Critical care time was exclusive of separately billable procedures and treating other patients. Critical care was necessary to treat or prevent imminent or life-threatening deterioration. Critical care was time spent personally by me on the following activities: development of treatment plan with patient and/or surrogate as well as nursing, discussions with  consultants, evaluation of patient's response to treatment, examination of patient, obtaining history from patient or surrogate, ordering and performing treatments and interventions, ordering and review of laboratory studies, ordering and review of radiographic studies, pulse oximetry and re-evaluation of  patient's condition.  Initially a code stroke was called the patient.  The patient was seen by neurology and they felt like this was not a stroke.  CT of the head did not show any acute changes.  The assumption is that this is postictal changes once again.  Urinalysis shows UTI chest x-ray shows questionable pneumonia.  She will be admitted to medicine and will have further work-up if she does not improve Final Clinical Impressions(s) / ED Diagnoses   Final diagnoses:  Acute cystitis with hematuria    ED Discharge Orders    None       Bethann Berkshire, MD 04/22/18 2317

## 2018-04-23 ENCOUNTER — Observation Stay (HOSPITAL_COMMUNITY): Payer: Medicare Other

## 2018-04-23 ENCOUNTER — Other Ambulatory Visit: Payer: Self-pay

## 2018-04-23 DIAGNOSIS — G9340 Encephalopathy, unspecified: Secondary | ICD-10-CM

## 2018-04-23 LAB — COMPREHENSIVE METABOLIC PANEL
ALK PHOS: 73 U/L (ref 38–126)
ALT: 10 U/L (ref 0–44)
AST: 23 U/L (ref 15–41)
Albumin: 2.9 g/dL — ABNORMAL LOW (ref 3.5–5.0)
Anion gap: 6 (ref 5–15)
BILIRUBIN TOTAL: 1.2 mg/dL (ref 0.3–1.2)
BUN: 10 mg/dL (ref 8–23)
CALCIUM: 8.7 mg/dL — AB (ref 8.9–10.3)
CO2: 27 mmol/L (ref 22–32)
CREATININE: 0.61 mg/dL (ref 0.44–1.00)
Chloride: 114 mmol/L — ABNORMAL HIGH (ref 98–111)
GFR calc Af Amer: >60 mL/min (ref >60)
Glucose, Bld: 95 mg/dL (ref 70–99)
Potassium: 4.5 mmol/L (ref 3.5–5.1)
Sodium: 147 mmol/L — ABNORMAL HIGH (ref 135–145)
Total Protein: 5.8 g/dL — ABNORMAL LOW (ref 6.5–8.1)

## 2018-04-23 LAB — CBC
HCT: 39.8 % (ref 36.0–46.0)
Hemoglobin: 13.1 g/dL (ref 12.0–15.0)
MCH: 30.8 pg (ref 26.0–34.0)
MCHC: 32.9 g/dL (ref 30.0–36.0)
MCV: 93.4 fL (ref 78.0–100.0)
PLATELETS: 117 10*3/uL — AB (ref 150–400)
RBC: 4.26 MIL/uL (ref 3.87–5.11)
RDW: 12.7 % (ref 11.5–15.5)
WBC: 7.9 10*3/uL (ref 4.0–10.5)

## 2018-04-23 LAB — GLUCOSE, CAPILLARY
GLUCOSE-CAPILLARY: 134 mg/dL — AB (ref 70–99)
Glucose-Capillary: 93 mg/dL (ref 70–99)
Glucose-Capillary: 89 mg/dL (ref 70–99)
Glucose-Capillary: 77 mg/dL (ref 70–99)

## 2018-04-23 LAB — MRSA PCR SCREENING: MRSA by PCR: NEGATIVE

## 2018-04-23 LAB — STREP PNEUMONIAE URINARY ANTIGEN: Strep Pneumo Urinary Antigen: NEGATIVE

## 2018-04-23 MED ORDER — POTASSIUM CHLORIDE 10 MEQ/100ML IV SOLN
10.0000 meq | INTRAVENOUS | Status: AC
  Administered 2018-04-23 (×2): 10 meq via INTRAVENOUS
  Filled 2018-04-23 (×2): qty 100

## 2018-04-23 MED ORDER — MAGNESIUM SULFATE 4 GM/100ML IV SOLN
4.0000 g | Freq: Once | INTRAVENOUS | Status: AC
  Administered 2018-04-23: 4 g via INTRAVENOUS
  Filled 2018-04-23: qty 100

## 2018-04-23 MED ORDER — SODIUM CHLORIDE 0.9 % IV SOLN
1.0000 g | INTRAVENOUS | Status: DC
  Administered 2018-04-23 – 2018-04-25 (×3): 1 g via INTRAVENOUS
  Filled 2018-04-23 (×3): qty 10

## 2018-04-23 NOTE — Progress Notes (Signed)
EEG Completed; Results Pending  

## 2018-04-23 NOTE — Procedures (Signed)
HPI:  77 y/o with MS change and possible seizure  TECHNICAL SUMMARY:  A multichannel referential and bipolar montage EEG using the standard international 10-20 system was performed on the patient described as lethargic.  The dominant background activity consists of 7  hertz activity seen most prominantly over the posterior head region. 5-7 Hz activity can be seen intermixed.  Low voltage fast (beta) activity is distributed symmetrically and maximally over the anterior head regions.  ACTIVATION:  Stepwise photic stimulation and hyperventilation are not performed.  EPILEPTIFORM ACTIVITY:  There were no spikes, sharp waves or paroxysmal activity.  SLEEP: Physiologic drowsiness is noted, but no stage II sleep.  CARDIAC:  The EKG lead revealed a regular rhythm at 64 bpm  IMPRESSION:  This is an abnormal EEG demonstrating a mild diffuse slowing of electrocerebral activity.  This can be seen in a wide variety of encephalopathic state including those of a toxic, metabolic, or degenerative nature.  There were no focal, hemispheric, or lateralizing features.  No epileptiform activity was recorded.

## 2018-04-23 NOTE — Evaluation (Signed)
Occupational Therapy Evaluation Patient Details Name: Melissa Jarvis MRN: [id number] DOB: 02/19/1941 Today's Date: 04/23/2018    History of Present Illness 77 y.o. female with history of dementia, hypertension, recently admitted 2 months ago for change in mental status attributed to possible new onset seizure and was placed on Keppra was found to be confused and throwing up after her dinner last night.  Patient was brought to the ER for further workup, found to have pneumonia.      Clinical Impression   This 77 y/o female presents with the above. Pt's daughter present during session and providing PLOF. At baseline pt resides in SNF, receiving assist for stand pivot transfers transfers and otherwise using w/c for majority of functional mobility. Pt receives assist for bathing and dressing ADLs from staff, was able to feed herself. Pt presents with increased lethargy this session, decreased ability to follow commands, and decreased functional performance. Pt requires max-totalA+2 for bed mobility, once sitting EOB pt able to maintain static sitting with minguard assist without UE support. Pt requiring max hand over hand assist to initiate and perform simple grooming ADLs while sitting EOB; currently requires max-totalA for all other UB/LB ADLs at this time. Pt mostly maintaining eyes closed during session, briefly opening when cued but does not maintain for long period of time. Pt will benefit from continued acute OT services and recommend follow up therapy services after return to SNF setting to maximize pt's overall safety and independence with ADLs and mobility.     Follow Up Recommendations  SNF;Supervision/Assistance - 24 hour    Equipment Recommendations  None recommended by OT           Precautions / Restrictions Precautions Precautions: Fall Restrictions Weight Bearing Restrictions: No      Mobility Bed Mobility Overal bed mobility: Needs Assistance Bed Mobility: Sidelying to  Sit;Sit to Sidelying   Sidelying to sit: Total assist;+2 for physical assistance;+2 for safety/equipment     Sit to sidelying: Max assist;+2 for physical assistance;+2 for safety/equipment General bed mobility comments: pt in sidelying upon entering room; requiring totalA+2 for LEs over EOB and to elevate trunk into sitting, requiring assist for balance initially but pt then able to maintain static sitting with minguard; verbal cues and assist to guide UB onto R elbow, assist to elevate LEs onto EOB when returning to sidelying   Transfers                 General transfer comment: NT this session due to pt lethargy/safety concerns     Balance Overall balance assessment: Needs assistance Sitting-balance support: No upper extremity supported;Feet unsupported Sitting balance-Leahy Scale: Fair Sitting balance - Comments: maintains static sitting balance with minguard assist                                    ADL either performed or assessed with clinical judgement   ADL Overall ADL's : Needs assistance/impaired     Grooming: Maximal assistance;Wash/dry face Grooming Details (indicate cue type and reason): max hand over hand assist to wash face seated EOB; pt able to continue task after receiving initial assist to initiate task, but only completing task for a few seconds at a time                                General ADL Comments: Pt currently  requiring max hand over hand assist for simple grooming ADLs, currently requires max-totalA for all additional aspects of ADLs. Pt required totalA+2 for bed mobility to sit EOB, once sitting pt maintaining static sitting balance with minguard assist. Able to tolerate sitting EOB for majority of session. Pt intermittently opening eyes but overall keeping eyes closed during session and minimally following commands.      Vision   Additional Comments: pt mostly keeping eyes closed during session; briefly opens eyes  but does not maintain more than a few seconds                 Pertinent Vitals/Pain Pain Assessment: Faces Faces Pain Scale: Hurts a little bit Pain Location: generalized  Pain Descriptors / Indicators: Grimacing Pain Intervention(s): Monitored during session     Hand Dominance     Extremity/Trunk Assessment Upper Extremity Assessment Upper Extremity Assessment: (pt intermittently moving UEs but is not intentional; pt does not maintain position when moving UE through PROM, though suspect this is partly due to cognition vs UE deficits )   Lower Extremity Assessment Lower Extremity Assessment: Defer to PT evaluation   Cervical / Trunk Assessment Cervical / Trunk Assessment: Kyphotic   Communication Communication Communication: Expressive difficulties;Receptive difficulties   Cognition Arousal/Alertness: Lethargic Behavior During Therapy: Flat affect Overall Cognitive Status: History of cognitive impairments - at baseline                                 General Comments: pt with baseline dementia; very minimally following commands this session; mostly keeping eyes closed during session, intermittently opens them but only briefly    General Comments  pt's daughter present during session     Exercises     Shoulder Instructions      Home Living Family/patient expects to be discharged to:: Skilled nursing facility                                 Additional Comments: [facility]       Prior Functioning/Environment Level of Independence: Needs assistance  Gait / Transfers Assistance Needed: pt's daughter reports pt mostly using w/c for mobility, was receiving assist from staff for transfers ADL's / Homemaking Assistance Needed: assist for bathing/dressing ADLs, daughter reports pt was able to feed herself             OT Problem List: Decreased strength;Impaired balance (sitting and/or standing);Decreased cognition;Decreased range of  motion;Decreased activity tolerance      OT Treatment/Interventions: Self-care/ADL training;DME and/or AE instruction;Therapeutic activities;Therapeutic exercise;Balance training;Patient/family education    OT Goals(Current goals can be found in the care plan section) Acute Rehab OT Goals Patient Stated Goal: none stated  OT Goal Formulation: Patient unable to participate in goal setting Time For Goal Achievement: 05/07/18 Potential to Achieve Goals: Fair  OT Frequency: Min 2X/week   Barriers to D/C:            Co-evaluation PT/OT/SLP Co-Evaluation/Treatment: Yes Reason for Co-Treatment: Necessary to address cognition/behavior during functional activity;For patient/therapist safety;To address functional/ADL transfers   OT goals addressed during session: ADL's and self-care      AM-PAC PT "6 Clicks" Daily Activity     Outcome Measure Help from another person eating meals?: A Lot Help from another person taking care of personal grooming?: A Lot Help from another person toileting, which includes using toliet, bedpan, or  urinal?: Total Help from another person bathing (including washing, rinsing, drying)?: Total Help from another person to put on and taking off regular upper body clothing?: Total Help from another person to put on and taking off regular lower body clothing?: Total 6 Click Score: 8   End of Session Nurse Communication: Mobility status  Activity Tolerance: Patient limited by lethargy Patient left: in bed;with call bell/phone within reach;with bed alarm set;with family/visitor present  OT Visit Diagnosis: Muscle weakness (generalized) (M62.81);Other symptoms and signs involving the nervous system (R29.898)                Time: 5621-30860922-0956 OT Time Calculation (min): 34 min Charges:  OT General Charges $OT Visit: 1 Visit OT Evaluation $OT Eval Moderate Complexity: 1 Mod G-Codes:     Marcy SirenBreanna Terrilee Dudzik, OT Pager [phone] 04/23/2018  Orlando PennerBreanna L Lorenzo Pereyra 04/23/2018,  12:43 PM

## 2018-04-23 NOTE — Evaluation (Signed)
SLP Cancellation Note  Patient Details Name: Richardson ChiquitoBarbara S Worm MRN: [id number] DOB: [birth year]   Cancelled treatment:       Reason Eval/Treat Not Completed: Other (comment);Fatigue/lethargy limiting ability to participate   Chales AbrahamsKimball, Nello Corro Ann 04/23/2018, 11:31 AM   Donavan Burnetamara Demarri Elie, MS [facility] S[facility] [phone]

## 2018-04-23 NOTE — Plan of Care (Signed)
77 year old female who lives in the nursing home admitted with possible seizure and pneumonia.  Patient apparently vomited and then became unresponsive and was found to have a right sided pneumonia by chest x-ray.  UA appears dirty urine culture pending.  Potassium on admission was 3.2 which has been repleted and now back to normal.  When I saw her this morning her daughter was by the bedside.  Patient was about to start working with physical therapy.  She appeared drowsy still but vital signs stable.  Continue Keppra.

## 2018-04-23 NOTE — Progress Notes (Signed)
NEURO HOSPITALIST PROGRESS NOTE   Subjective: Patient asleep, eyes closed in bed, NAD on RA. No seizure like activity noted. Patient does not want to be bothered.  Will swat at you to leave her alone. States " dang camilla" which is her daughter currently at bedside. Was able to state that her name was [place]. Exam: Vitals:   04/23/18 0350 04/23/18 0600  BP: (!) 174/83   Pulse: 77   Resp: 20   Temp: 97.7 F (36.5 C)   SpO2: 98% 97%    Physical Exam   HEENT-  Normocephalic, no lesions, without obvious abnormality.  Normal external eye and conjunctiva.   Cardiovascular- pulses palpable throughout   Lungs- no excessive working breathing.  Saturations within normal limits on RA Extremities- Warm, dry and intact Musculoskeletal-no joint tenderness, deformity or swelling Skin-warm and dry, intact   Neuro:  Mental Status: Drowsy, would only open eyes for me. was able to say "dang camilla" ( her daughter that is in the room) when I would agitate her.  Cranial Nerves: II:  Visual fields: difficult to assess as patient would squeeze her eyes shut and is non cooperative with exam. Eyes not crossing midline to left. III,IV, VI: ptosis not present,  pupils equal, round, reactive to light and accommodation V,VII: face symmetric,  VIII: UTA IX,X:UTA XI: UTA XII: UTA Motor/ Sensory: Able to move all 4 extremities spontaneously and in response to noxious stimuli. Does localize and withdraw Deep Tendon Reflexes: 3+  Biceps, 2+ patellar Plantars: Right: downgoing   Left: downgoing Cerebellar: UTA Gait: UTA    Medications:  Scheduled: . aspirin  300 mg Rectal Daily   Or  . aspirin  325 mg Oral Daily  . enoxaparin (LOVENOX) injection  40 mg Subcutaneous Q24H   Continuous: . sodium chloride 75 mL/hr at 04/22/18 2337  . aztreonam 2 g (04/23/18 0645)  . levETIRAcetam 500 mg (04/23/18 0545)  . magnesium sulfate 1 - 4 g bolus IVPB 4 g (04/23/18 0741)  .  potassium chloride    . vancomycin 750 mg (04/23/18 0119)   NFA:OZHYQMVHQIONG **OR** acetaminophen (TYLENOL) oral liquid 160 mg/5 mL **OR** acetaminophen, bisacodyl, hydrALAZINE  Pertinent Labs/Diagnostics:   Dg Chest Port 1 View  Result Date: 04/22/2018 CLINICAL DATA:  Weakness. EXAM: PORTABLE CHEST 1 VIEW COMPARISON:  02/11/2018 FINDINGS: Airspace opacity newly seen at the right base. No Kerley lines, effusion, or pneumothorax. Normal heart size and mediastinal contours. IMPRESSION: Pneumonia on the right. Followup PA and lateral chest X-ray is recommended in 3-4 weeks following trial of antibiotic therapy to ensure resolution. Electronically Signed   By: Marnee Spring M.D.   On: 04/22/2018 22:31   Ct Head Code Stroke Wo Contrast  Result Date: 04/22/2018 CLINICAL DATA:  Code stroke.  77 y/o  F; decreased responsiveness. EXAM: CT HEAD WITHOUT CONTRAST TECHNIQUE: Contiguous axial images were obtained from the base of the skull through the vertex without intravenous contrast. COMPARISON:  02/12/2018 MRI head.  02/11/2017 CT head. FINDINGS: Brain: No evidence of acute infarction, hemorrhage, hydrocephalus, extra-axial collection or mass lesion/mass effect. Stable chronic microvascular ischemic changes and parenchymal volume loss of the brain. Vascular: Calcific atherosclerosis of carotid siphons. No hyperdense vessel identified. Skull: Normal. Negative for fracture or focal lesion. Sinuses/Orbits: No acute finding. Other: None. ASPECTS [facility] Stroke Program Early CT Score) - Ganglionic level infarction (caudate, lentiform nuclei, internal capsule, insula,  M1-M3 cortex): 7 - Supraganglionic infarction (M4-M6 cortex): 3 Total score (0-10 with 10 being normal): 10 IMPRESSION: 1. No acute intracranial abnormality identified. 2. Stable chronic microvascular ischemic changes and parenchymal volume loss of the brain. 3. ASPECTS is 10 These results were communicated to Dr. Laurence SlateAroor at 8:57 pmon 7/7/2019by text  page via the [facility] messaging system. Electronically Signed   By: Mitzi HansenLance  Furusawa-Stratton M.D.   On: 04/22/2018 20:57    Impression: 77 y.o. 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  CT head is negative.  Suspect she possibly had a seizure, reduced seizure threshold in the setting of urinary tract infection.  Possible seizure Encephalopathy Urinary tract infection   Recommendations:  --Continue Keppra at current dose --Treat UTI --EEG routine --MRI after EEG --Seizure precautions    Valentina LucksJessica Williams, MSN, NP-C [facility] [phone]  Attending neurologist's note to follow  I have seen the patient and reviewed the above note.  Though seizure is a possibility, syncope due to emesis would also be a possibility.  Confusion in the setting of UTI and/or pneumonia would certainly be possible.  On my exam, she did not cross midline to the left and therefore I think further imaging/work-up is needed.  She did tell me her name, however.  Ritta SlotMcNeill Krisna Omar, MD [facility] [phone]  If 7pm- 7am, please page neurology on call as listed in AMION.   04/23/2018, 7:42 AM

## 2018-04-23 NOTE — Evaluation (Signed)
Physical Therapy Evaluation Patient Details Name: Melissa ChiquitoBarbara S Jarvis MRN: [id number] DOB: 1941/04/18 Today's Date: 04/23/2018   History of Present Illness  77 y.o. female with history of dementia, hypertension, recently admitted 2 months ago for change in mental status attributed to possible new onset seizure and was placed on Keppra was found to be confused and throwing up after her dinner last night.  Patient was brought to the ER for further workup, found to have pneumonia.       Clinical Impression  Ms. Melissa Jarvis is a pleasant 77 y/o female admitted with the above listed diagnosis. Patients daughter present for session and providing PLOF. Prior to admission, patient was a resident [facility], and required assistance for all mobility and ADLs with w/c primary means of mobility. Patient today only transferring to sitting EOB due to lethargy and safety concerns for further mobility, but did require Max/Total A +2 for bed mobility. Able to sit EOB for ~6-8 minutes with only Min guard assist for safety. PT to continue to follow acutely to maximize functional mobility and safety to venue listed below.    Follow Up Recommendations SNF;Supervision/Assistance - 24 hour    Equipment Recommendations  None recommended by PT    Recommendations for Other Services       Precautions / Restrictions Precautions Precautions: Fall Restrictions Weight Bearing Restrictions: No      Mobility  Bed Mobility Overal bed mobility: Needs Assistance Bed Mobility: Sidelying to Sit;Sit to Sidelying   Sidelying to sit: Total assist;+2 for physical assistance;+2 for safety/equipment     Sit to sidelying: Max assist;+2 for physical assistance;+2 for safety/equipment General bed mobility comments: pt in sidelying upon entering room; requiring totalA+2 for LEs over EOB and to elevate trunk into sitting, requiring assist for balance initially but pt then able to maintain static sitting with minguard; verbal  cues and assist to guide UB onto R elbow, assist to elevate LEs onto EOB when returning to sidelying   Transfers                 General transfer comment: NT this session due to pt lethargy/safety concerns   Ambulation/Gait                Stairs            Wheelchair Mobility    Modified Rankin (Stroke Patients Only)       Balance Overall balance assessment: Needs assistance Sitting-balance support: No upper extremity supported;Feet unsupported Sitting balance-Leahy Scale: Fair Sitting balance - Comments: maintains static sitting balance with minguard assist                                      Pertinent Vitals/Pain Pain Assessment: Faces Faces Pain Scale: Hurts a little bit Pain Location: generalized  Pain Descriptors / Indicators: Grimacing Pain Intervention(s): Monitored during session    Home Living Family/patient expects to be discharged to:: Skilled nursing facility                 Additional Comments: [facility]     Prior Function Level of Independence: Needs assistance   Gait / Transfers Assistance Needed: pt's daughter reports pt mostly using w/c for mobility, was receiving assist from staff for transfers  ADL's / Homemaking Assistance Needed: assist for bathing/dressing ADLs, daughter reports pt was able to feed herself  Hand Dominance        Extremity/Trunk Assessment   Upper Extremity Assessment Upper Extremity Assessment: Defer to OT evaluation    Lower Extremity Assessment Lower Extremity Assessment: Generalized weakness    Cervical / Trunk Assessment Cervical / Trunk Assessment: Kyphotic  Communication   Communication: Expressive difficulties;Receptive difficulties  Cognition Arousal/Alertness: Lethargic Behavior During Therapy: Flat affect Overall Cognitive Status: History of cognitive impairments - at baseline                                 General Comments: pt with  baseline dementia; very minimally following commands this session; mostly keeping eyes closed during session, intermittently opens them but only briefly       General Comments General comments (skin integrity, edema, etc.): patients daughter present for session    Exercises     Assessment/Plan    PT Assessment Patient needs continued PT services  PT Problem List Decreased range of motion;Decreased strength;Decreased activity tolerance;Decreased balance;Decreased mobility;Decreased knowledge of use of DME;Decreased safety awareness;Decreased knowledge of precautions       PT Treatment Interventions DME instruction;Gait training;Functional mobility training;Therapeutic activities;Therapeutic exercise;Balance training;Neuromuscular re-education;Cognitive remediation;Patient/family education    PT Goals (Current goals can be found in the Care Plan section)  Acute Rehab PT Goals Patient Stated Goal: none stated  PT Goal Formulation: With patient Time For Goal Achievement: 05/07/18 Potential to Achieve Goals: Fair    Frequency Min 2X/week   Barriers to discharge        Co-evaluation PT/OT/SLP Co-Evaluation/Treatment: Yes Reason for Co-Treatment: Necessary to address cognition/behavior during functional activity;For patient/therapist safety;To address functional/ADL transfers PT goals addressed during session: Mobility/safety with mobility         AM-PAC PT "6 Clicks" Daily Activity  Outcome Measure Difficulty turning over in bed (including adjusting bedclothes, sheets and blankets)?: Unable Difficulty moving from lying on back to sitting on the side of the bed? : Unable Difficulty sitting down on and standing up from a chair with arms (e.g., wheelchair, bedside commode, etc,.)?: Unable Help needed moving to and from a bed to chair (including a wheelchair)?: Total Help needed walking in hospital room?: Total Help needed climbing 3-5 steps with a railing? : Total 6 Click Score:  6    End of Session   Activity Tolerance: Patient tolerated treatment well;Patient limited by lethargy Patient left: in bed;with call bell/phone within reach;with bed alarm set;with family/visitor present Nurse Communication: Mobility status PT Visit Diagnosis: Unsteadiness on feet (R26.81);Other abnormalities of gait and mobility (R26.89);Muscle weakness (generalized) (M62.81)    Time: 1610-9604 PT Time Calculation (min) (ACUTE ONLY): 25 min   Charges:   PT Evaluation $PT Eval Moderate Complexity: 1 Mod     PT G Codes:        Kipp Laurence, PT, DPT 04/23/18 3:06 PM

## 2018-04-24 ENCOUNTER — Observation Stay (HOSPITAL_COMMUNITY): Payer: Medicare Other

## 2018-04-24 DIAGNOSIS — N3001 Acute cystitis with hematuria: Secondary | ICD-10-CM | POA: Diagnosis not present

## 2018-04-24 DIAGNOSIS — J181 Lobar pneumonia, unspecified organism: Secondary | ICD-10-CM | POA: Diagnosis not present

## 2018-04-24 LAB — GLUCOSE, CAPILLARY
GLUCOSE-CAPILLARY: 70 mg/dL (ref 70–99)
GLUCOSE-CAPILLARY: 83 mg/dL (ref 70–99)
GLUCOSE-CAPILLARY: 95 mg/dL (ref 70–99)
Glucose-Capillary: 84 mg/dL (ref 70–99)
Glucose-Capillary: 62 mg/dL — ABNORMAL LOW (ref 70–99)

## 2018-04-24 MED ORDER — LEVETIRACETAM 100 MG/ML PO SOLN
500.0000 mg | Freq: Two times a day (BID) | ORAL | Status: DC
  Administered 2018-04-24 – 2018-04-26 (×4): 500 mg via ORAL
  Filled 2018-04-24 (×4): qty 5

## 2018-04-24 MED ORDER — HYDRALAZINE HCL 50 MG PO TABS
50.0000 mg | ORAL_TABLET | Freq: Two times a day (BID) | ORAL | Status: DC
  Administered 2018-04-24 – 2018-04-26 (×5): 50 mg via ORAL
  Filled 2018-04-24 (×5): qty 1

## 2018-04-24 MED ORDER — AMLODIPINE BESYLATE 10 MG PO TABS
10.0000 mg | ORAL_TABLET | Freq: Every day | ORAL | Status: DC
  Administered 2018-04-24 – 2018-04-26 (×3): 10 mg via ORAL
  Filled 2018-04-24 (×3): qty 1

## 2018-04-24 MED ORDER — PRAVASTATIN SODIUM 40 MG PO TABS
40.0000 mg | ORAL_TABLET | Freq: Every day | ORAL | Status: DC
  Administered 2018-04-24 – 2018-04-26 (×3): 40 mg via ORAL
  Filled 2018-04-24 (×3): qty 1

## 2018-04-24 MED ORDER — DEXTROSE 5 % IV SOLN
INTRAVENOUS | Status: DC
  Administered 2018-04-24 – 2018-04-26 (×2): via INTRAVENOUS

## 2018-04-24 MED ORDER — GADOBENATE DIMEGLUMINE 529 MG/ML IV SOLN
15.0000 mL | Freq: Once | INTRAVENOUS | Status: AC
  Administered 2018-04-24: 15 mL via INTRAVENOUS

## 2018-04-24 NOTE — Evaluation (Signed)
Clinical/Bedside Swallow Evaluation Patient Details  Name: Melissa Jarvis MRN: [id number] Date of Birth: October 26, 1940  Today's Date: 04/24/2018 Time: SLP Start Time (ACUTE ONLY): [id number] SLP Stop Time (ACUTE ONLY): 0835 SLP Time Calculation (min) (ACUTE ONLY): 13 min  Past Medical History:  Past Medical History:  Diagnosis Date  . Alzheimer's disease   . Dementia   . Depressed   . Hyperlipidemia   . Hypertension   . Low back pain   . Migraines   . Osteoarthritis    Past Surgical History:  Past Surgical History:  Procedure Laterality Date  . ABDOMINAL HYSTERECTOMY    . CHOLECYSTECTOMY    . KNEE SURGERY     total   HPI:  77 y.o. female with history of dementia, hypertension, recently admitted 2 months ago for change in mental status attributed to possible new onset seizure and was placed on Keppra was found to be confused and throwing up after her dinner last night.  Patient was brought to the ER for further workup, found to have pneumonia.   Pt for MRI today, EEG showed diffuse slowing.  She was lethargic yesterday and daughter reported pt "gets like this during these episodes".     Assessment / Plan / Recommendation Clinical Impression  Pt presents with cognitive based dysphagia resulting in suspected delayed oral transiting most notably of icecream.  Swallow via straw was much more efficient. Pt kept her eyes closed throughout entire evaluation but did accept intake offered by this SLP and helped to hold her own cup.  No indications of aspiration with icecream or grape juice.  She did NOT attempt to masticate any solid and therefore SlP removed from oral cavity.  Recommend initiate full liquid diet with strict precautions.   SLP Visit Diagnosis: Dysphagia, oral phase (R13.11)    Aspiration Risk  Moderate aspiration risk    Diet Recommendation Thin liquid(full liquids)   Liquid Administration via: Straw Medication Administration: Crushed with puree Supervision: Full  supervision/cueing for compensatory strategies;Staff to assist with self feeding Compensations: Slow rate;Small sips/bites Postural Changes: Seated upright at 90 degrees;Remain upright for at least 30 minutes after po intake    Other  Recommendations Oral Care Recommendations: Oral care BID   Follow up Recommendations Skilled Nursing facility      Frequency and Duration min 1 x/week  1 week       Prognosis Prognosis for Safe Diet Advancement: Fair Barriers to Reach Goals: Cognitive deficits;Other (Comment);Time post onset      Swallow Study   General Date of Onset: 04/24/18 HPI: 77 y.o. female with history of dementia, hypertension, recently admitted 2 months ago for change in mental status attributed to possible new onset seizure and was placed on Keppra was found to be confused and throwing up after her dinner last night.  Patient was brought to the ER for further workup, found to have pneumonia.   Pt for MRI today, EEG showed diffuse slowing.  She was lethargic yesterday and daughter reported pt "gets like this during these episodes".   Type of Study: Bedside Swallow Evaluation Diet Prior to this Study: NPO Temperature Spikes Noted: No Respiratory Status: Room air History of Recent Intubation: No Behavior/Cognition: Alert;Cooperative;Pleasant mood Oral Cavity Assessment: Within Functional Limits Oral Care Completed by SLP: No Oral Cavity - Dentition: Other (Comment)(pt did not allow slp to view if she had dentition but did not masticate solids regardless) Self-Feeding Abilities: Total assist Patient Positioning: Other (comment)(pt was resistant to repositioning, upright as much as  able sidelying) Volitional Cough: Cognitively unable to elicit Volitional Swallow: Unable to elicit    Oral/Motor/Sensory Function Overall Oral Motor/Sensory Function: Other (comment)(pt able to seal lips on straw forming suction)   Ice Chips Ice chips: Not tested   Thin Liquid Thin Liquid: Within  functional limits Presentation: Straw    Nectar Thick Nectar Thick Liquid: Not tested   Honey Thick Honey Thick Liquid: Not tested   Puree Puree: Impaired Oral Phase Impairments: Reduced lingual movement/coordination(suspect delayed oral transiting)   Solid   GO   Solid: Impaired Oral Phase Impairments: Reduced lingual movement/coordination Other Comments: pt did not attempt to masticate with placement of graham cracker        Chales Abrahams 04/24/2018,8:43 AM  Donavan Burnet, MS [facility] SLP [phone]

## 2018-04-24 NOTE — Progress Notes (Signed)
PROGRESS NOTE    Melissa Jarvis  WUJ:[id number] DOB: 03-13-1941 DOA: 04/22/2018 PCP: Patient, No Pcp Per  Brief Narrative:77 y.o. female with history of dementia, hypertension, recently admitted 2 months ago for change in mental status attributed to possible new onset seizure and was placed on Keppra was found to be confused and throwing up after her dinner last night.  Patient was brought to the ER.  There was no complaint of any fever chills diarrhea chest pain or shortness of breath.  ED Course: In the ER patient appeared to be confused CT head is unremarkable neurologist on-call was consulted and at this time to have lower with Keppra 1 g and admitted for further observation.  On my exam patient is oriented to name only.  Moves all extremities.  UA is consistent with UTI chest x-ray shows possible pneumonia.    Assessment & Plan:   Principal Problem:   Acute encephalopathy Active Problems:   Dementia   Seizure (HCC)   Urinary tract infection with hematuria   HCAP (healthcare-associated pneumonia)  1] acute encephalopathy secondary to seizure.  Seizure probably precipitated by pneumonia.  MRI of the brain showed no acute finding, generalized atrophy is a temporal predominance advanced chronic small vessel ischemic changes throughout the brain.  Keppra.  2] pneumonia vancomycin DC'd due to MRSA PCR negative.  Continue Rocephin.  Speech evaluation noted.  3] hypertension pressure elevated restart home medications.  4] history of dementia-takes Aricept and Namenda at the nursing home but he is on hold at this time.  5] hyperlipidemia continue statin.    DVT prophylaxis: lovenox Code Status: Full code Family Communication: No family available Disposition Plan: Discharge to SNF when neurology sees okay.  Consultants:  Neurology Procedures: None Antimicrobials: Rocephin  Subjective: Resting in bed with eyes closed  Objective: Vitals:   04/23/18 1633 04/23/18 2341  04/24/18 0319 04/24/18 0814  BP: (!) 180/72 (!) 183/69 (!) 175/91 (!) 182/56  Pulse: 67 80 72 64  Resp: 16  19 18   Temp:  98.6 F (37 C) 98.2 F (36.8 C) 98.1 F (36.7 C)  TempSrc:  Oral Oral Oral  SpO2: 98% 95% 100% 100%  Weight:      Height:        Intake/Output Summary (Last 24 hours) at 04/24/2018 1203 Last data filed at 04/24/2018 0316 Gross per 24 hour  Intake 300 ml  Output -  Net 300 ml   Filed Weights   04/22/18 2016 04/23/18 0020  Weight: 77.1 kg (170 lb) 76.6 kg (168 lb 14 oz)    Examination:  General exam: Appears calm and comfortable  Respiratory system: Clear to auscultation. Respiratory effort normal. Cardiovascular system: S1 & S2 heard, RRR. No JVD, murmurs, rubs, gallops or clicks. No pedal edema. Gastrointestinal system: Abdomen is nondistended, soft and nontender. No organomegaly or masses felt. Normal bowel sounds heard. Central nervous system:confused Extremities: Symmetric 5 x 5 power. Skin: No rashes, lesions or ulcers    Data Reviewed: I have personally reviewed following labs and imaging studies  CBC: Recent Labs  Lab 04/22/18 2034 04/22/18 2043 04/23/18 0822  WBC 7.1  --  7.9  NEUTROABS 3.8  --   --   HGB 13.5 13.6 13.1  HCT 42.4 40.0 39.8  MCV 96.8  --  93.4  PLT 153  --  117*   Basic Metabolic Panel: Recent Labs  Lab 04/22/18 2034 04/22/18 2043 04/23/18 0822  NA 145 147* 147*  K 3.2* 3.2* 4.5  CL 109 109 114*  CO2 25  --  27  GLUCOSE 163* 156* 95  BUN 13 14 10   CREATININE 0.88 0.80 0.61  CALCIUM 9.3  --  8.7*   GFR: Estimated Creatinine Clearance: 59 mL/min (by C-G formula based on SCr of 0.61 mg/dL). Liver Function Tests: Recent Labs  Lab 04/22/18 2034 04/23/18 0822  AST 20 23  ALT 11 10  ALKPHOS 77 73  BILITOT 0.9 1.2  PROT 6.7 5.8*  ALBUMIN 3.3* 2.9*   Recent Labs  Lab 04/22/18 2034  LIPASE 38   No results for input(s): AMMONIA in the last 168 hours. Coagulation Profile: Recent Labs  Lab  04/22/18 2034  INR 1.18   Cardiac Enzymes: No results for input(s): CKTOTAL, CKMB, CKMBINDEX, TROPONINI in the last 168 hours. BNP (last 3 results) No results for input(s): PROBNP in the last 8760 hours. HbA1C: No results for input(s): HGBA1C in the last 72 hours. CBG: Recent Labs  Lab 04/23/18 0624 04/23/18 1128 04/23/18 1632 04/24/18 0033 04/24/18 0626  GLUCAP 93 89 77 70 84   Lipid Profile: No results for input(s): CHOL, HDL, LDLCALC, TRIG, CHOLHDL, LDLDIRECT in the last 72 hours. Thyroid Function Tests: No results for input(s): TSH, T4TOTAL, FREET4, T3FREE, THYROIDAB in the last 72 hours. Anemia Panel: No results for input(s): VITAMINB12, FOLATE, FERRITIN, TIBC, IRON, RETICCTPCT in the last 72 hours. Sepsis Labs: No results for input(s): PROCALCITON, LATICACIDVEN in the last 168 hours.  Recent Results (from the past 240 hour(s))  Urine Culture     Status: Abnormal (Preliminary result)   Collection Time: 04/22/18 10:26 PM  Result Value Ref Range Status   Specimen Description URINE, RANDOM  Final   Special Requests NONE  Final   Culture (A)  Final    >=100,000 COLONIES/mL UNIDENTIFIED ORGANISM Performed at [facility], [address], [place], [place] [address]    Report Status PENDING  Incomplete  MRSA PCR Screening     Status: None   Collection Time: 04/23/18  6:48 AM  Result Value Ref Range Status   MRSA by PCR NEGATIVE NEGATIVE Final    Comment:        The GeneXpert MRSA Assay (FDA approved for NASAL specimens only), is one component of a comprehensive MRSA colonization surveillance program. It is not intended to diagnose MRSA infection nor to guide or monitor treatment for MRSA infections. Performed at [facility], [address], [place], [place] [address]          Radiology Studies: Mr Brain Wo Contrast  Result Date: 04/23/2018 CLINICAL DATA:  Encephalopathy EXAM: MRI HEAD WITHOUT CONTRAST TECHNIQUE: Multiplanar, multiecho pulse  sequences of the brain and surrounding structures were obtained without intravenous contrast. COMPARISON:  02/12/2018 FINDINGS: Brain: No acute infarction, hemorrhage, hydrocephalus, extra-axial collection or mass lesion. Atrophy, advanced in the medial temporal lobes. Advanced chronic small vessel ischemia with confluent gliosis in the deep cerebral white matter. Multiple remote micro hemorrhages clustered in the posterior superficial cerebrum. Vascular: Major flow voids are preserved Skull and upper cervical spine: No evidence of marrow lesion. Sinuses/Orbits: No acute finding IMPRESSION: 1. Significantly motion degraded exam without acute finding. 2. Advanced medial temporal atrophy in keeping with history of Alzheimer's disease. 3. Moderate to advanced chronic small vessel ischemia in the cerebral white matter. 4. Multiple remote micro hemorrhages clustered along posterior cerebral convexities, question previous trauma given this pattern. 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 Electronically Signed   By: Kathrynn Ducking.D.  On: 04/23/2018 21:52   Mr Laqueta Jean QM Contrast  Result Date: 04/24/2018 CLINICAL DATA:  History of mental status changes and possible seizure. EXAM: MRI HEAD WITHOUT AND WITH CONTRAST TECHNIQUE: Multiplanar, multiecho pulse sequences of the brain and surrounding structures were obtained without and with intravenous contrast. CONTRAST:  15mL MULTIHANCE GADOBENATE DIMEGLUMINE 529 MG/ML IV SOLN COMPARISON:  04/23/2018.  02/12/2018. FINDINGS: Brain: Fusion imaging does not show any acute or subacute infarction. There chronic small-vessel ischemic changes of the pons. No large vessel cerebellar insult. Scattered foci hemosiderin deposition throughout the cerebellum. Cerebral hemispheres show generalized atrophy with temporal predominance and advanced changes of small vessel disease throughout the deep and subcortical white matter. No large vessel territory infarction. No evidence of  mass lesion, hydrocephalus or extra-axial collection. Widespread foci of hemosiderin deposition throughout the brain consistent with old microhemorrhages. Vascular: Major vessels at the base of the brain show flow. Skull and upper cervical spine: Negative Sinuses/Orbits: Clear/normal Other: None IMPRESSION: No change. No acute finding. Generalized atrophy with temporal predominance. Advanced chronic small-vessel ischemic changes throughout the brain. Multiple chronic microhemorrhages with hemosiderin deposition. Electronically Signed   By: Paulina Fusi M.D.   On: 04/24/2018 09:54   Dg Chest Port 1 View  Result Date: 04/22/2018 CLINICAL DATA:  Weakness. EXAM: PORTABLE CHEST 1 VIEW COMPARISON:  02/11/2018 FINDINGS: Airspace opacity newly seen at the right base. No Kerley lines, effusion, or pneumothorax. Normal heart size and mediastinal contours. IMPRESSION: Pneumonia on the right. Followup PA and lateral chest X-ray is recommended in 3-4 weeks following trial of antibiotic therapy to ensure resolution. Electronically Signed   By: Marnee Spring M.D.   On: 04/22/2018 22:31   Ct Head Code Stroke Wo Contrast  Result Date: 04/22/2018 CLINICAL DATA:  Code stroke.  77 y/o  F; decreased responsiveness. EXAM: CT HEAD WITHOUT CONTRAST TECHNIQUE: Contiguous axial images were obtained from the base of the skull through the vertex without intravenous contrast. COMPARISON:  02/12/2018 MRI head.  02/11/2017 CT head. FINDINGS: Brain: No evidence of acute infarction, hemorrhage, hydrocephalus, extra-axial collection or mass lesion/mass effect. Stable chronic microvascular ischemic changes and parenchymal volume loss of the brain. Vascular: Calcific atherosclerosis of carotid siphons. No hyperdense vessel identified. Skull: Normal. Negative for fracture or focal lesion. Sinuses/Orbits: No acute finding. Other: None. ASPECTS [facility] Stroke Program Early CT Score) - Ganglionic level infarction (caudate, lentiform nuclei,  internal capsule, insula, M1-M3 cortex): 7 - Supraganglionic infarction (M4-M6 cortex): 3 Total score (0-10 with 10 being normal): 10 IMPRESSION: 1. No acute intracranial abnormality identified. 2. Stable chronic microvascular ischemic changes and parenchymal volume loss of the brain. 3. ASPECTS is 10 These results were communicated to Dr. Laurence Slate at 8:57 pmon 7/7/2019by text page via the [facility] messaging system. Electronically Signed   By: Mitzi Hansen M.D.   On: 04/22/2018 20:57        Scheduled Meds: . aspirin  300 mg Rectal Daily   Or  . aspirin  325 mg Oral Daily  . enoxaparin (LOVENOX) injection  40 mg Subcutaneous Q24H   Continuous Infusions: . cefTRIAXone (ROCEPHIN)  IV 1 g (04/23/18 2334)  . dextrose 50 mL/hr at 04/24/18 0135  . levETIRAcetam 500 mg (04/24/18 5784)     LOS: 0 days     Alwyn Ren, MD [facility] If 7PM-7AM, please contact night-coverage [email or web address] Password [email or web address] 04/24/2018, 12:03 PM

## 2018-04-24 NOTE — Progress Notes (Signed)
Subjective: Slightly more resopnsive today.   Exam: Vitals:   04/24/18 0814 04/24/18 1249  BP: (!) 182/56 (!) 169/97  Pulse: 64 (!) 57  Resp: 18 18  Temp: 98.1 F (36.7 C) 97.9 F (36.6 C)  SpO2: 100% 99%   Gen: In bed, NAD Resp: non-labored breathing, no acute distress Abd: soft, nt  Neuro: MS: opens eyes to voice, tells me her name, states "stop it, that hurts" when I pinch her.  WU:[id number]:crosses midline in both directions Motor: moves all extremities spontaneously.  Sensory:withdraws to nox stim x 4.   Pertinent Labs: Na 147  Impression: 77 yo F with what I suspect is mulitfactorial delirium following syncope with nausea. Though possible, I have low suspicion that seizure precipitated this. EEG and MRI negative for acute findings. At this point, I would conintue treating her pna and UTI.   Recommendations: 1) Continue treatment of UTI/PNA 2) I would expect gradual improvement in mental status, but will likely lag behind improvement in labs.  3) Please call with further questions or concerns or if the patient does not progress as expected.   Ritta SlotMcNeill Delona Clasby, MD [facility] [phone]  If 7pm- 7am, please page neurology on call as listed in AMION.

## 2018-04-24 NOTE — NC FL2 (Signed)
[place] MEDICAID FL2 LEVEL OF CARE SCREENING TOOL     IDENTIFICATION  Patient Name: Melissa Jarvis Birthdate: 06-19-1941 Sex: female Admission Date (Current Location): 04/22/2018  [facility] and [place] Number:  Producer, television/film/video and Address:  [facility], [address] [place], [place] [address]      Provider Number: [id number]  Attending Physician Name and Address:  Alwyn Ren, MD  Relative Name and Phone Number:       Current Level of Care: Hospital Recommended Level of Care: Skilled Nursing Facility Prior Approval Number:    Date Approved/Denied:   PASRR Number:    Discharge Plan: SNF    Current Diagnoses: Patient Active Problem List   Diagnosis Date Noted  . Acute encephalopathy 04/22/2018  . HCAP (healthcare-associated pneumonia) 04/22/2018  . Post-ictal state (HCC) 02/18/2018  . Urinary tract infection with hematuria   . Altered mental status   . Seizure (HCC) 02/11/2018  . Chronic constipation 02/09/2018  . Dementia 12/16/2017  . Failure to thrive in adult 12/16/2017  . Hypernatremia 12/16/2017  . Hypokalemia 12/16/2017  . Hypertension 12/16/2017  . 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 Hyperlipidemia 12/16/2017  . Osteoporosis 12/16/2017  . Vitamin B12 deficiency 12/16/2017  . Depression 12/16/2017    Orientation RESPIRATION BLADDER Height & Weight        Normal Incontinent Weight: 168 lb 14 oz (76.6 kg) Height:  5\' 4"  (162.6 cm)  BEHAVIORAL SYMPTOMS/MOOD NEUROLOGICAL BOWEL NUTRITION STATUS    Convulsions/Seizures Incontinent Diet(see DC summary)  AMBULATORY STATUS COMMUNICATION OF NEEDS Skin   Extensive Assist Verbally Normal                       Personal Care Assistance Level of Assistance  Bathing, Feeding, Dressing Bathing Assistance: Maximum assistance Feeding assistance: Limited assistance Dressing Assistance: Maximum assistance     Functional Limitations Info  Sight, Hearing, Speech  Sight Info: Adequate Hearing Info: Adequate Speech Info: Impaired    SPECIAL CARE FACTORS FREQUENCY  PT (By licensed PT), OT (By licensed OT), Speech therapy     PT Frequency: 5x/wk OT Frequency: 5x/wk     Speech Therapy Frequency: 5x/wk      Contractures Contractures Info: Not present    Additional Factors Info  Code Status, Allergies Code Status Info: Full Allergies Info: Penicillins           Current Medications (04/24/2018):  This is the current hospital active medication list Current Facility-Administered Medications  Medication Dose Route Frequency Provider Last Rate Last Dose  . acetaminophen (TYLENOL) tablet 650 mg  650 mg Oral Q4H PRN Eduard Clos, MD       Or  . acetaminophen (TYLENOL) solution 650 mg  650 mg Per Tube Q4H PRN Eduard Clos, MD       Or  . acetaminophen (TYLENOL) suppository 650 mg  650 mg Rectal Q4H PRN Eduard Clos, MD      . aspirin suppository 300 mg  300 mg Rectal Daily Eduard Clos, MD   300 mg at 04/23/18 1204   Or  . aspirin tablet 325 mg  325 mg Oral Daily Eduard Clos, MD      . bisacodyl (DULCOLAX) suppository 10 mg  10 mg Rectal Daily PRN Eduard Clos, MD      . cefTRIAXone (ROCEPHIN) 1 g in sodium chloride 0.9 % 100 mL IVPB  1 g Intravenous Q24H Alwyn Ren, MD 200  mL/hr at 04/23/18 2334 1 g at 04/23/18 2334  . dextrose 5 % solution   Intravenous Continuous Schorr, Roma KayserKatherine P, NP 50 mL/hr at 04/24/18 0135    . enoxaparin (LOVENOX) injection 40 mg  40 mg Subcutaneous Q24H Eduard ClosKakrakandy, Arshad N, MD   40 mg at 04/23/18 [id number]  . hydrALAZINE (APRESOLINE) injection 10 mg  10 mg Intravenous Q4H PRN Eduard ClosKakrakandy, Arshad N, MD      . levETIRAcetam (KEPPRA) IVPB 500 mg/100 mL premix  500 mg Intravenous Q12H Eduard ClosKakrakandy, Arshad N, MD 400 mL/hr at 04/24/18 0624 500 mg at 04/24/18 [id number]     Discharge Medications: Please see discharge summary for a list of discharge medications.  Relevant  Imaging Results:  Relevant Lab Results:   Additional Information SS#: [id number]  Baldemar LenisElizabeth M Terricka Onofrio, LCSW

## 2018-04-25 DIAGNOSIS — R404 Transient alteration of awareness: Secondary | ICD-10-CM | POA: Diagnosis not present

## 2018-04-25 DIAGNOSIS — B964 Proteus (mirabilis) (morganii) as the cause of diseases classified elsewhere: Secondary | ICD-10-CM | POA: Diagnosis not present

## 2018-04-25 DIAGNOSIS — G309 Alzheimer's disease, unspecified: Secondary | ICD-10-CM | POA: Diagnosis present

## 2018-04-25 DIAGNOSIS — Z880 Allergy status to penicillin: Secondary | ICD-10-CM | POA: Diagnosis not present

## 2018-04-25 DIAGNOSIS — R279 Unspecified lack of coordination: Secondary | ICD-10-CM | POA: Diagnosis not present

## 2018-04-25 DIAGNOSIS — R569 Unspecified convulsions: Secondary | ICD-10-CM | POA: Diagnosis not present

## 2018-04-25 DIAGNOSIS — E785 Hyperlipidemia, unspecified: Secondary | ICD-10-CM | POA: Diagnosis not present

## 2018-04-25 DIAGNOSIS — J181 Lobar pneumonia, unspecified organism: Secondary | ICD-10-CM | POA: Diagnosis not present

## 2018-04-25 DIAGNOSIS — N390 Urinary tract infection, site not specified: Secondary | ICD-10-CM | POA: Diagnosis not present

## 2018-04-25 DIAGNOSIS — Z993 Dependence on wheelchair: Secondary | ICD-10-CM | POA: Diagnosis not present

## 2018-04-25 DIAGNOSIS — E162 Hypoglycemia, unspecified: Secondary | ICD-10-CM | POA: Diagnosis present

## 2018-04-25 DIAGNOSIS — I10 Essential (primary) hypertension: Secondary | ICD-10-CM | POA: Diagnosis not present

## 2018-04-25 DIAGNOSIS — I69398 Other sequelae of cerebral infarction: Secondary | ICD-10-CM | POA: Diagnosis not present

## 2018-04-25 DIAGNOSIS — Z7401 Bed confinement status: Secondary | ICD-10-CM | POA: Diagnosis not present

## 2018-04-25 DIAGNOSIS — Z79899 Other long term (current) drug therapy: Secondary | ICD-10-CM | POA: Diagnosis not present

## 2018-04-25 DIAGNOSIS — G9340 Encephalopathy, unspecified: Secondary | ICD-10-CM | POA: Diagnosis not present

## 2018-04-25 DIAGNOSIS — R1312 Dysphagia, oropharyngeal phase: Secondary | ICD-10-CM | POA: Diagnosis not present

## 2018-04-25 DIAGNOSIS — Z8673 Personal history of transient ischemic attack (TIA), and cerebral infarction without residual deficits: Secondary | ICD-10-CM | POA: Diagnosis not present

## 2018-04-25 DIAGNOSIS — J189 Pneumonia, unspecified organism: Secondary | ICD-10-CM | POA: Diagnosis not present

## 2018-04-25 DIAGNOSIS — Z87891 Personal history of nicotine dependence: Secondary | ICD-10-CM | POA: Diagnosis not present

## 2018-04-25 DIAGNOSIS — R2689 Other abnormalities of gait and mobility: Secondary | ICD-10-CM | POA: Diagnosis not present

## 2018-04-25 DIAGNOSIS — B952 Enterococcus as the cause of diseases classified elsewhere: Secondary | ICD-10-CM | POA: Diagnosis not present

## 2018-04-25 DIAGNOSIS — N3001 Acute cystitis with hematuria: Secondary | ICD-10-CM | POA: Diagnosis not present

## 2018-04-25 DIAGNOSIS — M6281 Muscle weakness (generalized): Secondary | ICD-10-CM | POA: Diagnosis not present

## 2018-04-25 DIAGNOSIS — M2550 Pain in unspecified joint: Secondary | ICD-10-CM | POA: Diagnosis not present

## 2018-04-25 DIAGNOSIS — Z9181 History of falling: Secondary | ICD-10-CM | POA: Diagnosis not present

## 2018-04-25 DIAGNOSIS — Y95 Nosocomial condition: Secondary | ICD-10-CM | POA: Diagnosis present

## 2018-04-25 DIAGNOSIS — F0280 Dementia in other diseases classified elsewhere without behavioral disturbance: Secondary | ICD-10-CM | POA: Diagnosis present

## 2018-04-25 DIAGNOSIS — Z7982 Long term (current) use of aspirin: Secondary | ICD-10-CM | POA: Diagnosis not present

## 2018-04-25 LAB — BASIC METABOLIC PANEL
Anion gap: 10 (ref 5–15)
BUN: <5 mg/dL — ABNORMAL LOW (ref 8–23)
CALCIUM: 8.9 mg/dL (ref 8.9–10.3)
CO2: 25 mmol/L (ref 22–32)
CREATININE: 0.58 mg/dL (ref 0.44–1.00)
Chloride: 107 mmol/L (ref 98–111)
GFR calc non Af Amer: >60 mL/min (ref >60)
Glucose, Bld: 85 mg/dL (ref 70–99)
Potassium: 3.5 mmol/L (ref 3.5–5.1)
SODIUM: 142 mmol/L (ref 135–145)

## 2018-04-25 LAB — GLUCOSE, CAPILLARY
GLUCOSE-CAPILLARY: 83 mg/dL (ref 70–99)
GLUCOSE-CAPILLARY: 89 mg/dL (ref 70–99)
GLUCOSE-CAPILLARY: 102 mg/dL — AB (ref 70–99)
Glucose-Capillary: 59 mg/dL — ABNORMAL LOW (ref 70–99)
Glucose-Capillary: 74 mg/dL (ref 70–99)

## 2018-04-25 LAB — URINE CULTURE: Culture: >=100000 — AB

## 2018-04-25 LAB — TSH: TSH: 3.486 u[IU]/mL (ref 0.350–4.500)

## 2018-04-25 MED ORDER — NITROFURANTOIN MONOHYD MACRO 100 MG PO CAPS
100.0000 mg | ORAL_CAPSULE | Freq: Two times a day (BID) | ORAL | Status: DC
  Administered 2018-04-25 – 2018-04-26 (×2): 100 mg via ORAL
  Filled 2018-04-25 (×4): qty 1

## 2018-04-25 MED ORDER — LISINOPRIL 10 MG PO TABS
10.0000 mg | ORAL_TABLET | Freq: Every day | ORAL | Status: DC
  Administered 2018-04-25 – 2018-04-26 (×2): 10 mg via ORAL
  Filled 2018-04-25 (×2): qty 1

## 2018-04-25 NOTE — Progress Notes (Signed)
PROGRESS NOTE  Melissa Jarvis EAV:[id number] DOB: October 02, 1941 DOA: 04/22/2018 PCP: Patient, No Pcp Per   LOS: 0 days   Brief Narrative / Interim history: 77 year old female with history of dementia, hypertension, recently admitted to the hospital couple months ago for altered mental status likely in the setting of new onset seizures placed on Keppra, was brought to the hospital on 7/7 from long-term SNF after being confused.  There was a question of a seizure and concern for patient being postictal, and neurology was consulted.  Assessment & Plan: Principal Problem:   Acute encephalopathy Active Problems:   Dementia   Seizure (HCC)   Urinary tract infection with hematuria   HCAP (healthcare-associated pneumonia)   Acute encephalopathy in the setting of seizure versus infectious with UTI/pneumonia -Mental status improving, daughter is at bedside, continue Antibiotics.  Still Not Back to Baseline  Right Lower Lobe Pneumonia -Responding to ceftriaxone, continue  UTI -Urine cultures with enterococcus and Proteus, speciated and sensitivities available today.  Add Macrobid for a few days to treat enterococcus, Proteus is covered by ceftriaxone  Hypertension -Continue home medications  History of dementia -Continue home medications  Hyperlipidemia -Continue statin  Hypoglycemia -New onset overnight, likely in the setting of infection, continue dextrose infusion  DVT prophylaxis: Lovenox Code Status: Full code Family Communication: Discussed with daughter at bedside Disposition Plan: Back to SNF 1 to 2 days  Consultants:   none  Procedures:   None   Antimicrobials:  Ceftriaxone 7/7 >>  Macrobid 7/10 >>   Subjective: - no chest pain, shortness of breath, no abdominal pain, nausea or vomiting. Underlying dementia  Objective: Vitals:   04/24/18 2356 04/25/18 0347 04/25/18 0904 04/25/18 0917  BP: (!) 154/93 (!) 196/73 (!) 183/74 (!) 183/74  Pulse: 71 (!) 58  60    Resp: 19 18  18   Temp: (!) 97.4 F (36.3 C) 97.6 F (36.4 C)  98.4 F (36.9 C)  TempSrc: Oral Oral  Oral  SpO2: 97% 100%  100%  Weight:      Height:        Intake/Output Summary (Last 24 hours) at 04/25/2018 1138 Last data filed at 04/25/2018 [id number] Gross per 24 hour  Intake 1526.15 ml  Output 2825 ml  Net -1298.85 ml   Filed Weights   04/22/18 2016 04/23/18 0020  Weight: 77.1 kg (170 lb) 76.6 kg (168 lb 14 oz)    Examination:  Constitutional: NAD Eyes: lids and conjunctivae normal ENMT: Mucous membranes are moist. Respiratory: clear to auscultation bilaterally, no wheezing, no crackles. Cardiovascular: Regular rate and rhythm, no murmurs / rubs / gallops. No LE edema.  Abdomen: no tenderness. Bowel sounds positive.  Skin: no rashes Neurologic: equal strength   Data Reviewed: I have independently reviewed following labs and imaging studies   CBC: Recent Labs  Lab 04/22/18 2034 04/22/18 2043 04/23/18 0822  WBC 7.1  --  7.9  NEUTROABS 3.8  --   --   HGB 13.5 13.6 13.1  HCT 42.4 40.0 39.8  MCV 96.8  --  93.4  PLT 153  --  117*   Basic Metabolic Panel: Recent Labs  Lab 04/22/18 2034 04/22/18 2043 04/23/18 0822 04/25/18 0642  NA 145 147* 147* 142  K 3.2* 3.2* 4.5 3.5  CL 109 109 114* 107  CO2 25  --  27 25  GLUCOSE 163* 156* 95 85  BUN 13 14 10  <5*  CREATININE 0.88 0.80 0.61 0.58  CALCIUM 9.3  --  8.7* 8.9  GFR: Estimated Creatinine Clearance: 59 mL/min (by C-G formula based on SCr of 0.58 mg/dL). Liver Function Tests: Recent Labs  Lab 04/22/18 2034 04/23/18 0822  AST 20 23  ALT 11 10  ALKPHOS 77 73  BILITOT 0.9 1.2  PROT 6.7 5.8*  ALBUMIN 3.3* 2.9*   Recent Labs  Lab 04/22/18 2034  LIPASE 38   No results for input(s): AMMONIA in the last 168 hours. Coagulation Profile: Recent Labs  Lab 04/22/18 2034  INR 1.18   Cardiac Enzymes: No results for input(s): CKTOTAL, CKMB, CKMBINDEX, TROPONINI in the last 168 hours. BNP (last 3  results) No results for input(s): PROBNP in the last 8760 hours. HbA1C: No results for input(s): HGBA1C in the last 72 hours. CBG: Recent Labs  Lab 04/24/18 1559 04/24/18 2353 04/25/18 0619 04/25/18 0647 04/25/18 1129  GLUCAP 62* 95 59* 74 83   Lipid Profile: No results for input(s): CHOL, HDL, LDLCALC, TRIG, CHOLHDL, LDLDIRECT in the last 72 hours. Thyroid Function Tests: Recent Labs    04/25/18 0732  TSH 3.486   Anemia Panel: No results for input(s): VITAMINB12, FOLATE, FERRITIN, TIBC, IRON, RETICCTPCT in the last 72 hours. Urine analysis:    Component Value Date/Time   COLORURINE YELLOW 04/22/2018 2159   APPEARANCEUR HAZY (A) 04/22/2018 2159   LABSPEC 1.012 04/22/2018 2159   PHURINE 6.0 04/22/2018 2159   GLUCOSEU NEGATIVE 04/22/2018 2159   HGBUR NEGATIVE 04/22/2018 2159   BILIRUBINUR NEGATIVE 04/22/2018 2159   KETONESUR 5 (A) 04/22/2018 2159   PROTEINUR 30 (A) 04/22/2018 2159   UROBILINOGEN 1.0 12/16/2014 1330   NITRITE NEGATIVE 04/22/2018 2159   LEUKOCYTESUR LARGE (A) 04/22/2018 2159   Sepsis Labs: Invalid input(s): PROCALCITONIN, LACTICIDVEN  Recent Results (from the past 240 hour(s))  Urine Culture     Status: Abnormal   Collection Time: 04/22/18 10:26 PM  Result Value Ref Range Status   Specimen Description URINE, RANDOM  Final   Special Requests   Final    NONE Performed at [facility], [address] [place], [place] [address]    Culture (A)  Final    >=100,000 COLONIES/mL ENTEROCOCCUS FAECALIS 20,000 COLONIES/mL PROTEUS MIRABILIS    Report Status 04/25/2018 FINAL  Final   Organism ID, Bacteria ENTEROCOCCUS FAECALIS (A)  Final   Organism ID, Bacteria PROTEUS MIRABILIS (A)  Final      Susceptibility   Enterococcus faecalis - MIC*    AMPICILLIN <=2 SENSITIVE Sensitive     LEVOFLOXACIN 1 SENSITIVE Sensitive     NITROFURANTOIN <=16 SENSITIVE Sensitive     VANCOMYCIN 2 SENSITIVE Sensitive     * >=100,000 COLONIES/mL ENTEROCOCCUS FAECALIS    Proteus mirabilis - MIC*    AMPICILLIN <=2 SENSITIVE Sensitive     CEFAZOLIN <=4 SENSITIVE Sensitive     CEFTRIAXONE <=1 SENSITIVE Sensitive     CIPROFLOXACIN <=0.25 SENSITIVE Sensitive     GENTAMICIN <=1 SENSITIVE Sensitive     IMIPENEM <=0.25 SENSITIVE Sensitive     NITROFURANTOIN RESISTANT Resistant     TRIMETH/SULFA <=20 SENSITIVE Sensitive     AMPICILLIN/SULBACTAM <=2 SENSITIVE Sensitive     PIP/TAZO <=4 SENSITIVE Sensitive     * 20,000 COLONIES/mL PROTEUS MIRABILIS  MRSA PCR Screening     Status: None   Collection Time: 04/23/18  6:48 AM  Result Value Ref Range Status   MRSA by PCR NEGATIVE NEGATIVE Final    Comment:        The GeneXpert MRSA Assay (FDA approved for NASAL specimens only),  is one component of a comprehensive MRSA colonization surveillance program. It is not intended to diagnose MRSA infection nor to guide or monitor treatment for MRSA infections. Performed at [facility], [address] [place], [place] [address]       Radiology Studies: Mr Brain Wo Contrast  Result Date: 04/23/2018 CLINICAL DATA:  Encephalopathy EXAM: MRI HEAD WITHOUT CONTRAST TECHNIQUE: Multiplanar, multiecho pulse sequences of the brain and surrounding structures were obtained without intravenous contrast. COMPARISON:  02/12/2018 FINDINGS: Brain: No acute infarction, hemorrhage, hydrocephalus, extra-axial collection or mass lesion. Atrophy, advanced in the medial temporal lobes. Advanced chronic small vessel ischemia with confluent gliosis in the deep cerebral white matter. Multiple remote micro hemorrhages clustered in the posterior superficial cerebrum. Vascular: Major flow voids are preserved Skull and upper cervical spine: No evidence of marrow lesion. Sinuses/Orbits: No acute finding IMPRESSION: 1. Significantly motion degraded exam without acute finding. 2. Advanced medial temporal atrophy in keeping with history of Alzheimer's disease. 3. Moderate to advanced chronic small  vessel ischemia in the cerebral white matter. 4. Multiple remote micro hemorrhages clustered along posterior cerebral convexities, question previous trauma given this pattern. 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 Electronically Signed   By: Marnee Spring M.D.   On: 04/23/2018 21:52   Mr Laqueta Jean JW Contrast  Result Date: 04/24/2018 CLINICAL DATA:  History of mental status changes and possible seizure. EXAM: MRI HEAD WITHOUT AND WITH CONTRAST TECHNIQUE: Multiplanar, multiecho pulse sequences of the brain and surrounding structures were obtained without and with intravenous contrast. CONTRAST:  15mL MULTIHANCE GADOBENATE DIMEGLUMINE 529 MG/ML IV SOLN COMPARISON:  04/23/2018.  02/12/2018. FINDINGS: Brain: Fusion imaging does not show any acute or subacute infarction. There chronic small-vessel ischemic changes of the pons. No large vessel cerebellar insult. Scattered foci hemosiderin deposition throughout the cerebellum. Cerebral hemispheres show generalized atrophy with temporal predominance and advanced changes of small vessel disease throughout the deep and subcortical white matter. No large vessel territory infarction. No evidence of mass lesion, hydrocephalus or extra-axial collection. Widespread foci of hemosiderin deposition throughout the brain consistent with old microhemorrhages. Vascular: Major vessels at the base of the brain show flow. Skull and upper cervical spine: Negative Sinuses/Orbits: Clear/normal Other: None IMPRESSION: No change. No acute finding. Generalized atrophy with temporal predominance. Advanced chronic small-vessel ischemic changes throughout the brain. Multiple chronic microhemorrhages with hemosiderin deposition. Electronically Signed   By: Paulina Fusi M.D.   On: 04/24/2018 09:54     Scheduled Meds: . amLODipine  10 mg Oral Daily  . aspirin  300 mg Rectal Daily   Or  . aspirin  325 mg Oral Daily  . enoxaparin (LOVENOX) injection  40 mg Subcutaneous Q24H  .  hydrALAZINE  50 mg Oral BID  . levETIRAcetam  500 mg Oral BID  . lisinopril  10 mg Oral Daily  . pravastatin  40 mg Oral Daily   Continuous Infusions: . cefTRIAXone (ROCEPHIN)  IV 1 g (04/24/18 2139)  . dextrose 50 mL/hr at 04/24/18 1850    Pamella Pert, MD, PhD [facility] Pager [phone]  If 7PM-7AM, please contact night-coverage [email or web address] Password [facility] 04/25/2018, 11:38 AM

## 2018-04-25 NOTE — Progress Notes (Signed)
  Speech Language Pathology Treatment: Dysphagia  Patient Details Name: Melissa Jarvis MRN: [id number] DOB: 06-19-1941 Today's Date: 04/25/2018 Time: [id number] SLP Time Calculation (min) (ACUTE ONLY): 11 min  Assessment / Plan / Recommendation Clinical Impression  Pt was apparently more alert this am.  This afternoon, finally awakened after stimulation/calling her name, but limited verbal output, did not respond to questions and followed commands involving acts of feeding/swallowing with significant delays.  Consumed water from a straw and received tspns of yogurt with adequate recognition, oral control, swift swallow response, and no s/s of aspiration. Recommend continuing current full liquid diet for now.   SLP will follow for diet tolerance and speech/language/cognitive assessment as MS allows.   HPI HPI: 77 y.o. female with history of dementia, hypertension, recently admitted 2 months ago for change in mental status attributed to possible new onset seizure and was placed on Keppra was found to be confused and throwing up after her dinner last night.  Patient was brought to the ER for further workup, found to have pneumonia.   Pt for MRI today, EEG showed diffuse slowing.  She was lethargic yesterday and daughter reported pt "gets like this during these episodes".        SLP Plan  Continue with current plan of care       Recommendations  Diet recommendations: Other(comment)(full liquids) Liquids provided via: Straw;Cup Medication Administration: Crushed with puree Supervision: Full supervision/cueing for compensatory strategies Compensations: Slow rate;Small sips/bites                Oral Care Recommendations: Oral care BID Follow up Recommendations: Skilled Nursing facility SLP Visit Diagnosis: Dysphagia, oral phase (R13.11) Plan: Continue with current plan of care       GO                Melissa Jarvis, Melissa Jarvis 04/25/2018, 4:23 PM  Melissa Jarvis L. Melissa Jarvis, [place]  CCC/SLP Pager [phone]

## 2018-04-25 NOTE — Progress Notes (Signed)
Physical Therapy Treatment Patient Details Name: Melissa Jarvis MRN: [id number] DOB: 05-21-1941 Today's Date: 04/25/2018    History of Present Illness 77 y.o. female with history of dementia, hypertension, recently admitted 2 months ago for change in mental status attributed to possible new onset seizure and was placed on Keppra was found to be confused and throwing up after her dinner last night.  Patient was brought to the ER for further workup, found to have pneumonia.       PT Comments    Ms. Logie doing well today - much more alert/awake and responding to name when called. PT session focusing on progressing functional mobility with patient continuing to require physical assist for bed mobility and positioning, but able to sit EOB for prolonged times without assist for trunk control. Attempting sit to stand today, however patient providing little assist and with little weight bearing through LE even with Max A +2, with transfer deferred for patient safety. Will continue to follow acutely.    Follow Up Recommendations  SNF;Supervision/Assistance - 24 hour     Equipment Recommendations  None recommended by PT    Recommendations for Other Services       Precautions / Restrictions Precautions Precautions: Fall Restrictions Weight Bearing Restrictions: No    Mobility  Bed Mobility Overal bed mobility: Needs Assistance Bed Mobility: Supine to Sit;Sit to Supine     Supine to sit: Mod assist;Max assist;+2 for physical assistance Sit to supine: Mod assist;Max assist;+2 for physical assistance   General bed mobility comments: able to sit EOB x ~8 minutes with appropriate trunk control and no LOB while seated  Transfers                 General transfer comment: attempted sit to stand at bedside with Max/Total A +2 - unable to achieve upright postion as patient kicks legs out and does not weight bear  Ambulation/Gait                 Stairs              Wheelchair Mobility    Modified Rankin (Stroke Patients Only)       Balance Overall balance assessment: Needs assistance Sitting-balance support: No upper extremity supported;Feet unsupported Sitting balance-Leahy Scale: Fair Sitting balance - Comments: maintains static sitting balance with minguard/supervision assist                                     Cognition Arousal/Alertness: Awake/alert Behavior During Therapy: Flat affect Overall Cognitive Status: History of cognitive impairments - at baseline                                 General Comments: responds to name, but otherwise non-verbal; does withdrawal from noxious stimuli      Exercises      General Comments        Pertinent Vitals/Pain Pain Assessment: Faces Faces Pain Scale: No hurt    Home Living                      Prior Function            PT Goals (current goals can now be found in the care plan section) Acute Rehab PT Goals Patient Stated Goal: none stated  PT Goal Formulation: With patient Time For  Goal Achievement: 05/07/18 Potential to Achieve Goals: Fair Progress towards PT goals: Progressing toward goals    Frequency    Min 2X/week      PT Plan Current plan remains appropriate    Co-evaluation              AM-PAC PT "6 Clicks" Daily Activity  Outcome Measure  Difficulty turning over in bed (including adjusting bedclothes, sheets and blankets)?: Unable Difficulty moving from lying on back to sitting on the side of the bed? : Unable Difficulty sitting down on and standing up from a chair with arms (e.g., wheelchair, bedside commode, etc,.)?: Unable Help needed moving to and from a bed to chair (including a wheelchair)?: Total Help needed walking in hospital room?: Total Help needed climbing 3-5 steps with a railing? : Total 6 Click Score: 6    End of Session Equipment Utilized During Treatment: Gait belt Activity Tolerance:  Patient tolerated treatment well Patient left: in bed;with call bell/phone within reach;with bed alarm set;with family/visitor present Nurse Communication: Mobility status PT Visit Diagnosis: Unsteadiness on feet (R26.81);Other abnormalities of gait and mobility (R26.89);Muscle weakness (generalized) (M62.81)     Time: [id number] PT Time Calculation (min) (ACUTE ONLY): 18 min  Charges:  $Therapeutic Activity: 8-22 mins                    G Codes:       Kipp LaurenceStephanie R Ly Bacchi, PT, DPT 04/25/18 3:59 PM Pager: [phone]

## 2018-04-25 NOTE — Progress Notes (Signed)
Hypoglycemic Event  CBG: 59  Treatment Po orange juice given  Symptoms: none  Follow-up CBG: ZOXW:[id number]:0645 CBG Result:74  Possible Reasons for Event  Comments/MD notified: Gherghe  costin    Laroy AppleEdoh, Regino Fournet Ilemeh

## 2018-04-26 DIAGNOSIS — J189 Pneumonia, unspecified organism: Secondary | ICD-10-CM | POA: Diagnosis not present

## 2018-04-26 DIAGNOSIS — I69398 Other sequelae of cerebral infarction: Secondary | ICD-10-CM | POA: Diagnosis not present

## 2018-04-26 DIAGNOSIS — N390 Urinary tract infection, site not specified: Secondary | ICD-10-CM | POA: Diagnosis not present

## 2018-04-26 DIAGNOSIS — M6281 Muscle weakness (generalized): Secondary | ICD-10-CM | POA: Diagnosis not present

## 2018-04-26 DIAGNOSIS — R1312 Dysphagia, oropharyngeal phase: Secondary | ICD-10-CM | POA: Diagnosis not present

## 2018-04-26 DIAGNOSIS — R279 Unspecified lack of coordination: Secondary | ICD-10-CM | POA: Diagnosis not present

## 2018-04-26 DIAGNOSIS — G9340 Encephalopathy, unspecified: Secondary | ICD-10-CM | POA: Diagnosis not present

## 2018-04-26 DIAGNOSIS — J181 Lobar pneumonia, unspecified organism: Secondary | ICD-10-CM | POA: Diagnosis not present

## 2018-04-26 DIAGNOSIS — R918 Other nonspecific abnormal finding of lung field: Secondary | ICD-10-CM | POA: Diagnosis not present

## 2018-04-26 DIAGNOSIS — B964 Proteus (mirabilis) (morganii) as the cause of diseases classified elsewhere: Secondary | ICD-10-CM | POA: Diagnosis not present

## 2018-04-26 DIAGNOSIS — R2689 Other abnormalities of gait and mobility: Secondary | ICD-10-CM | POA: Diagnosis not present

## 2018-04-26 DIAGNOSIS — M2550 Pain in unspecified joint: Secondary | ICD-10-CM | POA: Diagnosis not present

## 2018-04-26 DIAGNOSIS — R404 Transient alteration of awareness: Secondary | ICD-10-CM | POA: Diagnosis not present

## 2018-04-26 DIAGNOSIS — R0902 Hypoxemia: Secondary | ICD-10-CM | POA: Diagnosis not present

## 2018-04-26 DIAGNOSIS — G40919 Epilepsy, unspecified, intractable, without status epilepticus: Secondary | ICD-10-CM | POA: Diagnosis not present

## 2018-04-26 DIAGNOSIS — N3001 Acute cystitis with hematuria: Secondary | ICD-10-CM | POA: Diagnosis not present

## 2018-04-26 DIAGNOSIS — E785 Hyperlipidemia, unspecified: Secondary | ICD-10-CM | POA: Diagnosis not present

## 2018-04-26 DIAGNOSIS — R569 Unspecified convulsions: Secondary | ICD-10-CM | POA: Diagnosis not present

## 2018-04-26 DIAGNOSIS — I10 Essential (primary) hypertension: Secondary | ICD-10-CM | POA: Diagnosis not present

## 2018-04-26 DIAGNOSIS — I959 Hypotension, unspecified: Secondary | ICD-10-CM | POA: Diagnosis not present

## 2018-04-26 DIAGNOSIS — B952 Enterococcus as the cause of diseases classified elsewhere: Secondary | ICD-10-CM | POA: Diagnosis not present

## 2018-04-26 DIAGNOSIS — Z7401 Bed confinement status: Secondary | ICD-10-CM | POA: Diagnosis not present

## 2018-04-26 DIAGNOSIS — Z9181 History of falling: Secondary | ICD-10-CM | POA: Diagnosis not present

## 2018-04-26 LAB — GLUCOSE, CAPILLARY: Glucose-Capillary: 93 mg/dL (ref 70–99)

## 2018-04-26 MED ORDER — NITROFURANTOIN MONOHYD MACRO 100 MG PO CAPS: 100.0000 mg | ORAL_CAPSULE | Freq: Two times a day (BID) | ORAL | 0 refills | Status: DC

## 2018-04-26 MED ORDER — CEFDINIR 300 MG PO CAPS: 300.0000 mg | ORAL_CAPSULE | Freq: Two times a day (BID) | ORAL | 0 refills | Status: DC

## 2018-04-26 NOTE — Progress Notes (Signed)
Discharge to: [facility] Anticipated discharge date: 04/26/18 Family notified: Yes, at bedside Transportation by: [facility]  Report #: [id number], Room [address] Door  CSW signing off.  Blenda Nicelylizabeth Charlena Haub LCSW [phone]

## 2018-04-26 NOTE — Progress Notes (Signed)
  Speech Language Pathology Treatment: Dysphagia  Patient Details Name: Melissa ChiquitoBarbara S Nine MRN: [id number] DOB: 07-15-1941 Today's Date: 04/26/2018 Time: [id number] SLP Time Calculation (min) (ACUTE ONLY): 10 min  Assessment / Plan / Recommendation Clinical Impression  Pt poorly arousable today; daughter present, states her mother was alert earlier -she fed her breakfast and there were no difficulties. Pt [facility] for [facility]. Reviewed swallowing issues and precautions with her daughter, s/s of aspiration; discussed importance of SLP f/u so that diet can be appropriately progressed.  Daughter verbalizes understanding.  Pt has not been able to participate in speech/cognitive eval given her mental status.    HPI HPI: 77 y.o. female with history of dementia, hypertension, recently admitted 2 months ago for change in mental status attributed to possible new onset seizure and was placed on Keppra was found to be confused and throwing up after her dinner last night.  Patient was brought to the ER for further workup, found to have pneumonia.   Pt for MRI today, EEG showed diffuse slowing.  She was lethargic yesterday and daughter reported pt "gets like this during these episodes".        SLP Plan  Other (Comment)(Pt D/Cing to SNF today)       Recommendations  Diet recommendations: Other(comment)(continue full liquids) Liquids provided via: Straw;Cup Medication Administration: Crushed with puree Supervision: Full supervision/cueing for compensatory strategies Compensations: Slow rate;Small sips/bites                Oral Care Recommendations: Oral care BID Follow up Recommendations: Skilled Nursing facility SLP Visit Diagnosis: Dysphagia, oral phase (R13.11) Plan: Other (Comment)(Pt D/Cing to SNF today)       GO                Blenda Mountsouture, Tyrece Vanterpool Laurice 04/26/2018, 11:48 AM

## 2018-04-26 NOTE — Discharge Summary (Signed)
Physician Discharge Summary  Melissa Jarvis ZOX:[id number] DOB: 1941-01-15 DOA: 04/22/2018  PCP: Patient, No Pcp Per  Admit date: 04/22/2018 Discharge date: 04/26/2018  Admitted From: SNF Disposition:  SNF  Recommendations for Outpatient Follow-up:  1. Follow up with PCP in 1-2 weeks 2. Continue Omnicef for 4 days for pneumonia. She tolerated Ceftriaxone  3. Continue Macrobid for 4 days for UTI  Home Health: none Equipment/Devices: none  Discharge Condition: stable CODE STATUS: Full code Diet recommendation: full liquid, advance as tolerated and have speech re-evaluate   HPI: Per Dr. Gilford Silvius, Melissa Jarvis is a 77 y.o. female with history of dementia, hypertension, recently admitted 2 months ago for change in mental status attributed to possible new onset seizure and was placed on Keppra was found to be confused and throwing up after her dinner last night.  Patient was brought to the ER.  There was no complaint of any fever chills diarrhea chest pain or shortness of breath. ED Course: In the ER patient appeared to be confused CT head is unremarkable neurologist on-call was consulted and at this time to have lower with Keppra 1 g and admitted for further observation.  On my exam patient is oriented to name only.  Moves all extremities.  UA is consistent with UTI chest x-ray shows possible pneumonia.  Hospital Course: Acute encephalopathy in the setting of seizure versus infectious with UTI/pneumonia -Mental status improving, daughter is at bedside, continue Antibiotics, anticipate continuing improvement with underlying treatment of her infectious process.  Right Lower Lobe Pneumonia -Responding to ceftriaxone, transition to [facility] for 4 additional days to complete a 7 day course.  UTI -Urine cultures with enterococcus and Proteus, speciated and sensitivities available.  Added Macrobid for a few days to treat enterococcus, Proteus is covered by ceftriaxone. Continue Macrobid for 4  days to complete a 5 day course.  Hypertension -Continue home medications History of dementia -Continue home medications Hyperlipidemia -Continue statin  Discharge Diagnoses:  Principal Problem:   Acute encephalopathy Active Problems:   Dementia   Seizure (HCC)   Urinary tract infection with hematuria   HCAP (healthcare-associated pneumonia)   CAP (community acquired pneumonia)     Discharge Instructions   Allergies as of 04/26/2018      Reactions   Penicillins Other (See Comments)   Tolerated Ceftriaxone 04/2018 Other reaction(s): UNKNOWN REACTION      Medication List    TAKE these medications   amLODipine 10 MG tablet Commonly known as:  NORVASC Take 10 mg by mouth daily.   aspirin EC 81 MG EC tablet Generic drug:  aspirin Take 81 mg by mouth daily. Swallow whole.   bisacodyl 10 MG suppository Commonly known as:  DULCOLAX Place 10 mg rectally daily as needed for moderate constipation (if not relieved by MOM).   bisacodyl 10 MG/30ML Enem Commonly known as:  FLEET Place 10 mg rectally daily as needed (if not relieved by Bisacodyl suppository).   Calcium Carb-Cholecalciferol 600-800 MG-UNIT Tabs Take 1 tablet by mouth every morning.   calcium carbonate 1250 (500 Ca) MG chewable tablet Commonly known as:  OS-CAL Chew 1 tablet by mouth daily.   cefdinir 300 MG capsule Commonly known as:  OMNICEF Take 1 capsule (300 mg total) by mouth 2 (two) times daily.   D3-1000 1000 units tablet Generic drug:  Cholecalciferol Take 1,000 Units by mouth daily.   Fish Oil 1000 MG Caps Take 1 capsule by mouth every morning.   hydrALAZINE 50 MG tablet Commonly known  as:  APRESOLINE Take 50 mg by mouth 2 (two) times daily.   levETIRAcetam 100 MG/ML solution Commonly known as:  KEPPRA Take 5 mLs (500 mg total) by mouth 2 (two) times daily.   MILK OF MAGNESIA PO Take 30 mLs by mouth daily as needed (if no BM in 3 days).   NAMENDA XR 28 MG Cp24 24 hr capsule Generic  drug:  memantine Take 28 mg by mouth at bedtime.   nitrofurantoin (macrocrystal-monohydrate) 100 MG capsule Commonly known as:  MACROBID Take 1 capsule (100 mg total) by mouth every 12 (twelve) hours.   NUEDEXTA 20-10 MG Caps Generic drug:  Dextromethorphan-quiNIDine Take 1 capsule by mouth daily.   NUTRITIONAL SUPPLEMENTS PO Initiate 120 ml Medpass 3 times daily with meals related to weight loss   polyethylene glycol packet Commonly known as:  MIRALAX / GLYCOLAX Take 17 g by mouth daily.   pravastatin 40 MG tablet Commonly known as:  PRAVACHOL Take 40 mg by mouth daily.   rivastigmine 6 MG capsule Commonly known as:  EXELON Take 6 mg by mouth 2 (two) times daily.   vitamin B-12 1000 MCG tablet Commonly known as:  CYANOCOBALAMIN Take 1,000 mcg by mouth daily.      Consultations:  Neurology   Procedures/Studies:  EEG IMPRESSION: This is an abnormal EEG demonstrating a mild diffuse slowing of electrocerebral activity.  This can be seen in a wide variety of encephalopathic state including those of a toxic, metabolic, or degenerative nature.  There were no focal, hemispheric, or lateralizing features.  No epileptiform activity was recorded  Mr Brain Wo Contrast  Result Date: 04/23/2018 CLINICAL DATA:  Encephalopathy EXAM: MRI HEAD WITHOUT CONTRAST TECHNIQUE: Multiplanar, multiecho pulse sequences of the brain and surrounding structures were obtained without intravenous contrast. COMPARISON:  02/12/2018 FINDINGS: Brain: No acute infarction, hemorrhage, hydrocephalus, extra-axial collection or mass lesion. Atrophy, advanced in the medial temporal lobes. Advanced chronic small vessel ischemia with confluent gliosis in the deep cerebral white matter. Multiple remote micro hemorrhages clustered in the posterior superficial cerebrum. Vascular: Major flow voids are preserved Skull and upper cervical spine: No evidence of marrow lesion. Sinuses/Orbits: No acute finding IMPRESSION: 1.  Significantly motion degraded exam without acute finding. 2. Advanced medial temporal atrophy in keeping with history of Alzheimer's disease. 3. Moderate to advanced chronic small vessel ischemia in the cerebral white matter. 4. Multiple remote micro hemorrhages clustered along posterior cerebral convexities, question previous trauma given this pattern. 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 Electronically Signed   By: Marnee Spring M.D.   On: 04/23/2018 21:52   Mr Laqueta Jean ZO Contrast  Result Date: 04/24/2018 CLINICAL DATA:  History of mental status changes and possible seizure. EXAM: MRI HEAD WITHOUT AND WITH CONTRAST TECHNIQUE: Multiplanar, multiecho pulse sequences of the brain and surrounding structures were obtained without and with intravenous contrast. CONTRAST:  15mL MULTIHANCE GADOBENATE DIMEGLUMINE 529 MG/ML IV SOLN COMPARISON:  04/23/2018.  02/12/2018. FINDINGS: Brain: Fusion imaging does not show any acute or subacute infarction. There chronic small-vessel ischemic changes of the pons. No large vessel cerebellar insult. Scattered foci hemosiderin deposition throughout the cerebellum. Cerebral hemispheres show generalized atrophy with temporal predominance and advanced changes of small vessel disease throughout the deep and subcortical white matter. No large vessel territory infarction. No evidence of mass lesion, hydrocephalus or extra-axial collection. Widespread foci of hemosiderin deposition throughout the brain consistent with old microhemorrhages. Vascular: Major vessels at the base of the brain show flow. Skull and upper cervical spine:  Negative Sinuses/Orbits: Clear/normal Other: None IMPRESSION: No change. No acute finding. Generalized atrophy with temporal predominance. Advanced chronic small-vessel ischemic changes throughout the brain. Multiple chronic microhemorrhages with hemosiderin deposition. Electronically Signed   By: Paulina Fusi M.D.   On: 04/24/2018 09:54   Dg Chest  Port 1 View  Result Date: 04/22/2018 CLINICAL DATA:  Weakness. EXAM: PORTABLE CHEST 1 VIEW COMPARISON:  02/11/2018 FINDINGS: Airspace opacity newly seen at the right base. No Kerley lines, effusion, or pneumothorax. Normal heart size and mediastinal contours. IMPRESSION: Pneumonia on the right. Followup PA and lateral chest X-ray is recommended in 3-4 weeks following trial of antibiotic therapy to ensure resolution. Electronically Signed   By: Marnee Spring M.D.   On: 04/22/2018 22:31   Ct Head Code Stroke Wo Contrast  Result Date: 04/22/2018 CLINICAL DATA:  Code stroke.  77 y/o  F; decreased responsiveness. EXAM: CT HEAD WITHOUT CONTRAST TECHNIQUE: Contiguous axial images were obtained from the base of the skull through the vertex without intravenous contrast. COMPARISON:  02/12/2018 MRI head.  02/11/2017 CT head. FINDINGS: Brain: No evidence of acute infarction, hemorrhage, hydrocephalus, extra-axial collection or mass lesion/mass effect. Stable chronic microvascular ischemic changes and parenchymal volume loss of the brain. Vascular: Calcific atherosclerosis of carotid siphons. No hyperdense vessel identified. Skull: Normal. Negative for fracture or focal lesion. Sinuses/Orbits: No acute finding. Other: None. ASPECTS [facility] Stroke Program Early CT Score) - Ganglionic level infarction (caudate, lentiform nuclei, internal capsule, insula, M1-M3 cortex): 7 - Supraganglionic infarction (M4-M6 cortex): 3 Total score (0-10 with 10 being normal): 10 IMPRESSION: 1. No acute intracranial abnormality identified. 2. Stable chronic microvascular ischemic changes and parenchymal volume loss of the brain. 3. ASPECTS is 10 These results were communicated to Dr. Laurence Slate at 8:57 pmon 7/7/2019by text page via the [facility] messaging system. Electronically Signed   By: Mitzi Hansen M.D.   On: 04/22/2018 20:57     Subjective: - no chest pain, shortness of breath, no abdominal pain, nausea or vomiting.    Discharge Exam: Vitals:   04/26/18 0418 04/26/18 0757  BP: 138/81 (!) 163/73  Pulse: 66 (!) 57  Resp: 18 18  Temp: 97.7 F (36.5 C) 97.8 F (36.6 C)  SpO2: 100% 100%   General: Pt is alert, awake, not in acute distress Cardiovascular: RRR, S1/S2 +, no rubs, no gallops Respiratory: CTA bilaterally, no wheezing, no rhonchi Abdominal: Soft, NT, ND, bowel sounds +   The results of significant diagnostics from this hospitalization (including imaging, microbiology, ancillary and laboratory) are listed below for reference.     Microbiology: Recent Results (from the past 240 hour(s))  Urine Culture     Status: Abnormal   Collection Time: 04/22/18 10:26 PM  Result Value Ref Range Status   Specimen Description URINE, RANDOM  Final   Special Requests   Final    NONE Performed at [facility], [address], [place], [place] [address]    Culture (A)  Final    >=100,000 COLONIES/mL ENTEROCOCCUS FAECALIS 20,000 COLONIES/mL PROTEUS MIRABILIS    Report Status 04/25/2018 FINAL  Final   Organism ID, Bacteria ENTEROCOCCUS FAECALIS (A)  Final   Organism ID, Bacteria PROTEUS MIRABILIS (A)  Final      Susceptibility   Enterococcus faecalis - MIC*    AMPICILLIN <=2 SENSITIVE Sensitive     LEVOFLOXACIN 1 SENSITIVE Sensitive     NITROFURANTOIN <=16 SENSITIVE Sensitive     VANCOMYCIN 2 SENSITIVE Sensitive     * >=100,000 COLONIES/mL ENTEROCOCCUS  FAECALIS   Proteus mirabilis - MIC*    AMPICILLIN <=2 SENSITIVE Sensitive     CEFAZOLIN <=4 SENSITIVE Sensitive     CEFTRIAXONE <=1 SENSITIVE Sensitive     CIPROFLOXACIN <=0.25 SENSITIVE Sensitive     GENTAMICIN <=1 SENSITIVE Sensitive     IMIPENEM <=0.25 SENSITIVE Sensitive     NITROFURANTOIN RESISTANT Resistant     TRIMETH/SULFA <=20 SENSITIVE Sensitive     AMPICILLIN/SULBACTAM <=2 SENSITIVE Sensitive     PIP/TAZO <=4 SENSITIVE Sensitive     * 20,000 COLONIES/mL PROTEUS MIRABILIS  MRSA PCR Screening     Status: None   Collection  Time: 04/23/18  6:48 AM  Result Value Ref Range Status   MRSA by PCR NEGATIVE NEGATIVE Final    Comment:        The GeneXpert MRSA Assay (FDA approved for NASAL specimens only), is one component of a comprehensive MRSA colonization surveillance program. It is not intended to diagnose MRSA infection nor to guide or monitor treatment for MRSA infections. Performed at [facility], [address] [place], [place] [address]      Labs: BNP (last 3 results) No results for input(s): BNP in the last 8760 hours. Basic Metabolic Panel: Recent Labs  Lab 04/22/18 2034 04/22/18 2043 04/23/18 0822 04/25/18 0642  NA 145 147* 147* 142  K 3.2* 3.2* 4.5 3.5  CL 109 109 114* 107  CO2 25  --  27 25  GLUCOSE 163* 156* 95 85  BUN 13 14 10  <5*  CREATININE 0.88 0.80 0.61 0.58  CALCIUM 9.3  --  8.7* 8.9   Liver Function Tests: Recent Labs  Lab 04/22/18 2034 04/23/18 0822  AST 20 23  ALT 11 10  ALKPHOS 77 73  BILITOT 0.9 1.2  PROT 6.7 5.8*  ALBUMIN 3.3* 2.9*   Recent Labs  Lab 04/22/18 2034  LIPASE 38   No results for input(s): AMMONIA in the last 168 hours. CBC: Recent Labs  Lab 04/22/18 2034 04/22/18 2043 04/23/18 0822  WBC 7.1  --  7.9  NEUTROABS 3.8  --   --   HGB 13.5 13.6 13.1  HCT 42.4 40.0 39.8  MCV 96.8  --  93.4  PLT 153  --  117*   Cardiac Enzymes: No results for input(s): CKTOTAL, CKMB, CKMBINDEX, TROPONINI in the last 168 hours. BNP: Invalid input(s): POCBNP CBG: Recent Labs  Lab 04/25/18 0647 04/25/18 1129 04/25/18 1848 04/25/18 2143 04/26/18 0540  GLUCAP 74 83 89 102* 93   D-Dimer No results for input(s): DDIMER in the last 72 hours. Hgb A1c No results for input(s): HGBA1C in the last 72 hours. Lipid Profile No results for input(s): CHOL, HDL, LDLCALC, TRIG, CHOLHDL, LDLDIRECT in the last 72 hours. Thyroid function studies Recent Labs    04/25/18 0732  TSH 3.486   Anemia work up No results for input(s): VITAMINB12, FOLATE,  FERRITIN, TIBC, IRON, RETICCTPCT in the last 72 hours. Urinalysis    Component Value Date/Time   COLORURINE YELLOW 04/22/2018 2159   APPEARANCEUR HAZY (A) 04/22/2018 2159   LABSPEC 1.012 04/22/2018 2159   PHURINE 6.0 04/22/2018 2159   GLUCOSEU NEGATIVE 04/22/2018 2159   HGBUR NEGATIVE 04/22/2018 2159   BILIRUBINUR NEGATIVE 04/22/2018 2159   KETONESUR 5 (A) 04/22/2018 2159   PROTEINUR 30 (A) 04/22/2018 2159   UROBILINOGEN 1.0 12/16/2014 1330   NITRITE NEGATIVE 04/22/2018 2159   LEUKOCYTESUR LARGE (A) 04/22/2018 2159   Sepsis Labs Invalid input(s): PROCALCITONIN,  WBC,  LACTICIDVEN  Time coordinating discharge: 35 minutes  SIGNED:  Pamella Pert, MD  [facility] 04/26/2018, 8:43 AM Pager [phone]  If 7PM-7AM, please contact night-coverage [email or web address] Password [email or web address]

## 2018-04-26 NOTE — Progress Notes (Signed)
Patient Discharging to [facility]. [facility] Paper work sent with [facility] Nurse called report.

## 2018-04-26 NOTE — Care Management Note (Signed)
Case Management Note  Patient Details  Name: Richardson ChiquitoBarbara S Godbey MRN: [id number] Date of Birth: 06-24-1941  Subjective/Objective:      Pt admitted with acute encephalopathy. [facility] is [facility].              Action/Plan: Pt discharging back to [facility] today. CM signing off.   Expected Discharge Date:  04/26/18               Expected Discharge Plan:  Skilled Nursing Facility  In-House Referral:  Clinical Social Work  Discharge planning Services     Post Acute Care Choice:    Choice offered to:     DME Arranged:    DME Agency:     HH Arranged:    HH Agency:     Status of Service:  Completed, signed off  If discussed at [employer] Meetings, dates discussed:    Additional Comments:  Kermit BaloKelli F Jerin Franzel, RN 04/26/2018, 11:34 AM

## 2018-04-27 ENCOUNTER — Encounter: Payer: Self-pay | Admitting: Internal Medicine

## 2018-04-27 ENCOUNTER — Non-Acute Institutional Stay (SKILLED_NURSING_FACILITY): Payer: Medicare Other | Admitting: Internal Medicine

## 2018-04-27 DIAGNOSIS — J189 Pneumonia, unspecified organism: Secondary | ICD-10-CM

## 2018-04-27 DIAGNOSIS — R569 Unspecified convulsions: Secondary | ICD-10-CM | POA: Diagnosis not present

## 2018-04-27 DIAGNOSIS — J181 Lobar pneumonia, unspecified organism: Secondary | ICD-10-CM

## 2018-04-27 DIAGNOSIS — I10 Essential (primary) hypertension: Secondary | ICD-10-CM

## 2018-04-27 DIAGNOSIS — N390 Urinary tract infection, site not specified: Secondary | ICD-10-CM | POA: Diagnosis not present

## 2018-04-27 DIAGNOSIS — E785 Hyperlipidemia, unspecified: Secondary | ICD-10-CM

## 2018-04-27 DIAGNOSIS — F0280 Dementia in other diseases classified elsewhere without behavioral disturbance: Secondary | ICD-10-CM

## 2018-04-27 DIAGNOSIS — G300 Alzheimer's disease with early onset: Secondary | ICD-10-CM

## 2018-04-27 DIAGNOSIS — N3001 Acute cystitis with hematuria: Secondary | ICD-10-CM | POA: Diagnosis not present

## 2018-04-27 DIAGNOSIS — G9340 Encephalopathy, unspecified: Secondary | ICD-10-CM | POA: Diagnosis not present

## 2018-04-27 DIAGNOSIS — B952 Enterococcus as the cause of diseases classified elsewhere: Secondary | ICD-10-CM

## 2018-04-27 NOTE — Progress Notes (Signed)
:   Location:  [facility] Room Number: 203D Place of Service:  SNF (31)  Randon Goldsmith. Lyn Hollingshead, MD  Patient Care Team: Patient, No Pcp Per as PCP - General (General Practice)  Extended Emergency Contact Information Primary Emergency Contact: [name],[name] Address: [address] [place], [place] [address] [place] Home Phone: [phone] Relation: Daughter Secondary Emergency Contact: [name] Mobile Phone: [phone] Relation: Daughter     Allergies: Penicillins  Chief Complaint  Patient presents with  . Readmit To SNF    Admit to [facility]    HPI: Patient is 77 y.o. female With dementia, hypertension, recently admitted 2 months ago for change in mental status attributed to possible new onset seizure who was placed on Keppra at that time.  Patient was found to be confused and throwing up after her dinner last night and was brought to the emergency department.  There are no complaints of fever, chills, diarrhea, chest pain, or shortness of breath.  In the ED patient appeared confused, CT head was unremarkable and neurologist on-call was consulted and at this time loaded her with 1 g of Keppra and she was admitted for further observation.  UA was consistent with UTI and chest x-ray showed possible pneumonia.  Patient was admitted to [facility] from 7/7-11 where she was treated for acute encephalopathy in the setting of seizure versus infection.  Patient was started on Rocephin for UTI and possible pneumonia where patient improved.  Urine grew out greater than 100,000 colonies of enterococcus and 20,000 of Proteus so Macrobid was added for a few days to treat enterococcus patient is admitted back to skilled nursing facility for OT/PT.  While at skilled nursing facility patient will be followed for hypertension treated with Norvasc, hydralazine, dementia treated with Namenda XR and Exelon and hyperlipidemia treated with  Pravachol.  Fish oil.  Past Medical History:  Diagnosis Date  . Acute encephalopathy   . Altered mental status   . Alzheimer's disease   . Chronic constipation   . Dementia   . Dementia   . Depressed   . Failure to thrive in adult   . HCAP (healthcare-associated pneumonia)   . Hyperlipidemia   . Hypernatremia   . Hypertension   . Hypokalemia   . Low back pain   . Migraines   . Osteoarthritis   . Osteoporosis   . PBA (pseudobulbar affect)   . Post-ictal state (HCC)   . Seizures (HCC)   . Urinary tract infection with hematuria   . Vitamin B12 deficiency     Past Surgical History:  Procedure Laterality Date  . ABDOMINAL HYSTERECTOMY    . CHOLECYSTECTOMY    . KNEE SURGERY     total    Allergies as of 04/27/2018      Reactions   Penicillins Other (See Comments)   Tolerated Ceftriaxone 04/2018 Other reaction(s): UNKNOWN REACTION      Medication List        Accurate as of 04/27/18 11:36 AM. Always use your most recent med list.          amLODipine 10 MG tablet Commonly known as:  NORVASC Take 10 mg by mouth daily.   aspirin EC 81 MG EC tablet Generic drug:  aspirin Take 81 mg by mouth daily. Swallow whole.   bisacodyl 10 MG suppository Commonly known as:  DULCOLAX Place 10 mg rectally daily as needed  for moderate constipation (if not relieved by MOM).   bisacodyl 10 MG/30ML Enem Commonly known as:  FLEET Place 10 mg rectally daily as needed (if not relieved by Bisacodyl suppository).   Calcium Carb-Cholecalciferol 600-800 MG-UNIT Tabs Take 1 tablet by mouth every morning.   calcium carbonate 1250 (500 Ca) MG chewable tablet Commonly known as:  OS-CAL Chew 1 tablet by mouth daily.   cefdinir 300 MG capsule Commonly known as:  OMNICEF Take 1 capsule (300 mg total) by mouth 2 (two) times daily.   D3-1000 1000 units tablet Generic drug:  Cholecalciferol Take 1,000 Units by mouth daily.   Fish Oil 1000 MG Caps Take 1 capsule by mouth every  morning.   hydrALAZINE 50 MG tablet Commonly known as:  APRESOLINE Take 50 mg by mouth 2 (two) times daily.   levETIRAcetam 100 MG/ML solution Commonly known as:  KEPPRA Take 5 mLs (500 mg total) by mouth 2 (two) times daily.   MILK OF MAGNESIA PO Take 30 mLs by mouth daily as needed (if no BM in 3 days).   NAMENDA XR 28 MG Cp24 24 hr capsule Generic drug:  memantine Take 28 mg by mouth at bedtime.   nitrofurantoin (macrocrystal-monohydrate) 100 MG capsule Commonly known as:  MACROBID Take 1 capsule (100 mg total) by mouth every 12 (twelve) hours.   NUEDEXTA 20-10 MG Caps Generic drug:  Dextromethorphan-quiNIDine Take 1 capsule by mouth daily.   NUTRITIONAL SUPPLEMENTS PO Initiate 120 ml Medpass 3 times daily with meals related to weight loss   polyethylene glycol packet Commonly known as:  MIRALAX / GLYCOLAX Take 17 g by mouth daily.   pravastatin 40 MG tablet Commonly known as:  PRAVACHOL Take 40 mg by mouth daily.   rivastigmine 6 MG capsule Commonly known as:  EXELON Take 6 mg by mouth 2 (two) times daily.   vitamin B-12 1000 MCG tablet Commonly known as:  CYANOCOBALAMIN Take 1,000 mcg by mouth daily.       No orders of the defined types were placed in this encounter.   Immunization History  Administered Date(s) Administered  . Influenza, High Dose Seasonal PF 08/01/2016  . Influenza-Unspecified 07/31/2017  . PPD Test 12/09/2017  . Pneumococcal Conjugate-13 08/01/2016  . Tdap 12/16/2014    Social History   Tobacco Use  . Smoking status: Former Games developer  . Smokeless tobacco: Never Used  Substance Use Topics  . Alcohol use: No    Family history is   Family History  Problem Relation Age of Onset  . Hypertension Mother       Review of Systems  DATA OBTAINED: from patient-limited; nursing-no acute concerns GENERAL:  no fevers, fatigue, appetite changes SKIN: No itching, or rash EYES: No eye pain, redness, discharge EARS: No earache,  tinnitus, change in hearing NOSE: No congestion, drainage or bleeding  MOUTH/THROAT: No mouth or tooth pain, No sore throat RESPIRATORY: No cough, wheezing, SOB CARDIAC: No chest pain, palpitations, lower extremity edema  GI: No abdominal pain, No N/V/D or constipation, No heartburn or reflux  GU: No dysuria, frequency or urgency, or incontinence  MUSCULOSKELETAL: No unrelieved bone/joint pain NEUROLOGIC: No headache, dizziness or focal weakness PSYCHIATRIC: No c/o anxiety or sadness   Vitals:   04/27/18 1109  BP: 130/67  Pulse: 78  Resp: 18  Temp: 98.9 F (37.2 C)    SpO2 Readings from Last 1 Encounters:  04/26/18 (P) 100%   Body mass index is 27.12 kg/m.     Physical Exam  GENERAL APPEARANCE: Alert,  No acute distress.  SKIN: No diaphoresis rash HEAD: Normocephalic, atraumatic  EYES: Conjunctiva/lids clear. Pupils round, reactive. EOMs intact.  EARS: External exam WNL, canals clear. Hearing grossly normal.  NOSE: No deformity or discharge.  MOUTH/THROAT: Lips w/o lesions  RESPIRATORY: Breathing is even, unlabored. Lung sounds are clear   CARDIOVASCULAR: Heart RRR no murmurs, rubs or gallops. No peripheral edema.   GASTROINTESTINAL: Abdomen is soft, non-tender, not distended w/ normal bowel sounds. GENITOURINARY: Bladder non tender, not distended  MUSCULOSKELETAL: No abnormal joints or musculature NEUROLOGIC:  Cranial nerves 2-12 grossly intact. Moves all extremities  PSYCHIATRIC: Dementia, no behavioral issues  Patient Active Problem List   Diagnosis Date Noted  . CAP (community acquired pneumonia) 04/25/2018  . Acute encephalopathy 04/22/2018  . HCAP (healthcare-associated pneumonia) 04/22/2018  . Post-ictal state (HCC) 02/18/2018  . Urinary tract infection with hematuria   . Altered mental status   . Seizure (HCC) 02/11/2018  . Chronic constipation 02/09/2018  . Dementia 12/16/2017  . Failure to thrive in adult 12/16/2017  . Hypernatremia 12/16/2017  .  Hypokalemia 12/16/2017  . Hypertension 12/16/2017  . 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 Hyperlipidemia 12/16/2017  . Osteoporosis 12/16/2017  . Vitamin B12 deficiency 12/16/2017  . Depression 12/16/2017      Labs reviewed: Basic Metabolic Panel:    Component Value Date/Time   NA 142 04/25/2018 0642   NA 146 02/13/2018   K 3.5 04/25/2018 0642   CL 107 04/25/2018 0642   CO2 25 04/25/2018 0642   GLUCOSE 85 04/25/2018 0642   BUN <5 (L) 04/25/2018 0642   BUN 9 02/13/2018   CREATININE 0.58 04/25/2018 0642   CALCIUM 8.9 04/25/2018 0642   PROT 5.8 (L) 04/23/2018 0822   ALBUMIN 2.9 (L) 04/23/2018 0822   AST 23 04/23/2018 0822   ALT 10 04/23/2018 0822   ALKPHOS 73 04/23/2018 0822   BILITOT 1.2 04/23/2018 0822   GFRNONAA >60 04/25/2018 0642   GFRAA >60 04/25/2018 0642    Recent Labs    04/22/18 2034 04/22/18 2043 04/23/18 0822 04/25/18 0642  NA 145 147* 147* 142  K 3.2* 3.2* 4.5 3.5  CL 109 109 114* 107  CO2 25  --  27 25  GLUCOSE 163* 156* 95 85  BUN 13 14 10  <5*  CREATININE 0.88 0.80 0.61 0.58  CALCIUM 9.3  --  8.7* 8.9   Liver Function Tests: Recent Labs    02/11/18 1325 04/22/18 2034 04/23/18 0822  AST 23 20 23   ALT 8* 11 10  ALKPHOS 78 77 73  BILITOT 0.9 0.9 1.2  PROT 5.7* 6.7 5.8*  ALBUMIN 3.0* 3.3* 2.9*   Recent Labs    04/22/18 2034  LIPASE 38   No results for input(s): AMMONIA in the last 8760 hours. CBC: Recent Labs    10/22/17 1827  02/11/18 1325  02/13/18 1002 04/22/18 2034 04/22/18 2043 04/23/18 0822  WBC 3.9*   < > 7.5  7.5   < > 10.5 7.1  --  7.9  NEUTROABS 2.2  --  3.6  --   --  3.8  --   --   HGB 16.4*   < > 13.2  13.2   < > 13.4 13.5 13.6 13.1  HCT 48.4*   < > 41.2  41   < > 41.1 42.4 40.0 39.8  MCV 96.0  --  98.3   < > 97.9 96.8  --  93.4  PLT 120*   < >  PLATELET CLUMPS NOTED ON SMEAR, COUNT APPEARS ADEQUATE   < > 130* 153  --  117*   < > = values in this interval not displayed.   Lipid Recent Labs     02/12/18 0521  CHOL 152  HDL 49  LDLCALC 99  TRIG 21    Cardiac Enzymes: No results for input(s): CKTOTAL, CKMB, CKMBINDEX, TROPONINI in the last 8760 hours. BNP: No results for input(s): BNP in the last 8760 hours. No results found for: [facility] Lab Results  Component Value Date   HGBA1C 4.2 (L) 02/12/2018   Lab Results  Component Value Date   TSH 3.486 04/25/2018   No results found for: VITAMINB12 No results found for: FOLATE No results found for: IRON, TIBC, FERRITIN  Imaging and Procedures obtained prior to SNF admission: Mr Brain Wo Contrast  Result Date: 04/23/2018 CLINICAL DATA:  Encephalopathy EXAM: MRI HEAD WITHOUT CONTRAST TECHNIQUE: Multiplanar, multiecho pulse sequences of the brain and surrounding structures were obtained without intravenous contrast. COMPARISON:  02/12/2018 FINDINGS: Brain: No acute infarction, hemorrhage, hydrocephalus, extra-axial collection or mass lesion. Atrophy, advanced in the medial temporal lobes. Advanced chronic small vessel ischemia with confluent gliosis in the deep cerebral white matter. Multiple remote micro hemorrhages clustered in the posterior superficial cerebrum. Vascular: Major flow voids are preserved Skull and upper cervical spine: No evidence of marrow lesion. Sinuses/Orbits: No acute finding IMPRESSION: 1. Significantly motion degraded exam without acute finding. 2. Advanced medial temporal atrophy in keeping with history of Alzheimer's disease. 3. Moderate to advanced chronic small vessel ischemia in the cerebral white matter. 4. Multiple remote micro hemorrhages clustered along posterior cerebral convexities, question previous trauma given this pattern. 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 Electronically Signed   By: Marnee Spring M.D.   On: 04/23/2018 21:52   Dg Chest Port 1 View  Result Date: 04/22/2018 CLINICAL DATA:  Weakness. EXAM: PORTABLE CHEST 1 VIEW COMPARISON:  02/11/2018 FINDINGS: Airspace opacity newly  seen at the right base. No Kerley lines, effusion, or pneumothorax. Normal heart size and mediastinal contours. IMPRESSION: Pneumonia on the right. Followup PA and lateral chest X-ray is recommended in 3-4 weeks following trial of antibiotic therapy to ensure resolution. Electronically Signed   By: Marnee Spring M.D.   On: 04/22/2018 22:31   Ct Head Code Stroke Wo Contrast  Result Date: 04/22/2018 CLINICAL DATA:  Code stroke.  77 y/o  F; decreased responsiveness. EXAM: CT HEAD WITHOUT CONTRAST TECHNIQUE: Contiguous axial images were obtained from the base of the skull through the vertex without intravenous contrast. COMPARISON:  02/12/2018 MRI head.  02/11/2017 CT head. FINDINGS: Brain: No evidence of acute infarction, hemorrhage, hydrocephalus, extra-axial collection or mass lesion/mass effect. Stable chronic microvascular ischemic changes and parenchymal volume loss of the brain. Vascular: Calcific atherosclerosis of carotid siphons. No hyperdense vessel identified. Skull: Normal. Negative for fracture or focal lesion. Sinuses/Orbits: No acute finding. Other: None. ASPECTS [facility] Stroke Program Early CT Score) - Ganglionic level infarction (caudate, lentiform nuclei, internal capsule, insula, M1-M3 cortex): 7 - Supraganglionic infarction (M4-M6 cortex): 3 Total score (0-10 with 10 being normal): 10 IMPRESSION: 1. No acute intracranial abnormality identified. 2. Stable chronic microvascular ischemic changes and parenchymal volume loss of the brain. 3. ASPECTS is 10 These results were communicated to Dr. Laurence Slate at 8:57 pmon 7/7/2019by text page via the [facility] messaging system. Electronically Signed   By: Mitzi Hansen M.D.   On: 04/22/2018 20:57     Not all labs, radiology exams or other studies  done during hospitalization come through on my EPIC note; however they are reviewed by me.    Assessment and Plan  Acute encephalopathy/seizure versus infection with UTI/pneumonia-patient started on  Rocephin, mental status improving SNF -patient is admitted back for OT/PT  Right lower lobe pneumonia-responding to Rocephin, transitioned to [facility] for 4 additional days to complete a 7-day course SNF -tinea Omnicef for 4 more days; continue Keppra 500 mg twice daily  UTI- urine cultures grew out greater than 100,000 enterococcus and 20,000 Proteus; Macrobid was added for few days to treat the greater than 100,000 enterococcus while the Proteus which was 20,000 is being covered by the Rocephin SNF -continue Omnicef 300 mg twice daily for 4 more days and Macrobid 100 mg every 12 hours x4 days  Hypertension SNF -controlled; continue hydralazine 50 mg twice daily and Norvasc 10 mg daily  Dementia SNF -stable; continue Exelon 6 mg twice daily and Namenda XR 28 1 p.o. Daily  Hyperlipidemia SNF -not stated as uncontrolled; continue Pravachol 40 mg daily and fish oil 1000 mg daily   Time spent greater than 35 minutes;> 50% of time with patient was spent reviewing records, labs, tests and studies, counseling and developing plan of care  Thurston Hole D. Lyn Hollingshead, MD

## 2018-04-29 ENCOUNTER — Encounter: Payer: Self-pay | Admitting: Internal Medicine

## 2018-04-29 DIAGNOSIS — B952 Enterococcus as the cause of diseases classified elsewhere: Secondary | ICD-10-CM | POA: Insufficient documentation

## 2018-04-29 DIAGNOSIS — N390 Urinary tract infection, site not specified: Secondary | ICD-10-CM

## 2018-04-30 ENCOUNTER — Encounter: Payer: Self-pay | Admitting: Internal Medicine

## 2018-04-30 NOTE — Assessment & Plan Note (Signed)
Stable, continue Os-Cal plus D6 100-801 p.o. daily and vitamin D 1000 units daily plus Os-Cal chewable,  1 daily

## 2018-04-30 NOTE — Assessment & Plan Note (Signed)
Controlled; continue Norvasc 10 mg daily and hydralazine 50 mg twice daily

## 2018-04-30 NOTE — Assessment & Plan Note (Signed)
LDL 99, HDL 49 on Pravachol 40 mg daily; to 80 mg daily

## 2018-05-01 LAB — BASIC METABOLIC PANEL
BUN: 15 (ref 4–21)
CREATININE: 0.5 (ref 0.5–1.1)
Glucose: 96
POTASSIUM: 3.8 (ref 3.4–5.3)
Sodium: 145 (ref 137–147)

## 2018-05-01 LAB — CBC AND DIFFERENTIAL
HCT: 39 (ref 36–46)
Hemoglobin: 12.7 (ref 12.0–16.0)
Platelets: 169 (ref 150–399)
WBC: 4.7

## 2018-05-09 ENCOUNTER — Encounter: Payer: Self-pay | Admitting: Internal Medicine

## 2018-05-09 ENCOUNTER — Non-Acute Institutional Stay (SKILLED_NURSING_FACILITY): Payer: Medicare Other | Admitting: Internal Medicine

## 2018-05-09 DIAGNOSIS — R918 Other nonspecific abnormal finding of lung field: Secondary | ICD-10-CM | POA: Diagnosis not present

## 2018-05-09 DIAGNOSIS — R569 Unspecified convulsions: Secondary | ICD-10-CM | POA: Diagnosis not present

## 2018-05-09 DIAGNOSIS — G40919 Epilepsy, unspecified, intractable, without status epilepticus: Secondary | ICD-10-CM

## 2018-05-09 DIAGNOSIS — R0902 Hypoxemia: Secondary | ICD-10-CM | POA: Diagnosis not present

## 2018-05-09 NOTE — Progress Notes (Addendum)
:  Location:  [facility] Room Number: 203D Place of Service:  SNF (31)  Randon Goldsmith. Lyn Hollingshead, MD  Patient Care Team: Patient, No Pcp Per as PCP - General (General Practice)  Extended Emergency Contact Information Primary Emergency Contact: [name],[name] Address: [address] [place], [place] [address] [place] Home Phone: [phone] Relation: Daughter Secondary Emergency Contact: [name] Mobile Phone: [phone] Relation: Daughter     Allergies: Penicillins  Chief Complaint  Patient presents with  . Acute Visit    Seizure    HPI: Patient is 77 y.o. female who urgently for a seizure.   Seizure lasted approximately 20- 30 seconds patient did wet her diaper during the seizure.  Patient was unresponsive after seizure but 5 minutes later she began to open her eyes to command and displayed purposeful movements to noxious stimuli.  Patient did not desaturate.  Patient had a diaper so unknown if she was incontinent of urine-patient was not incontinent of stool  Past Medical History:  Diagnosis Date  . Acute encephalopathy   . Altered mental status   . Alzheimer's disease   . Chronic constipation   . Dementia   . Dementia   . Depressed   . Failure to thrive in adult   . HCAP (healthcare-associated pneumonia)   . Hyperlipidemia   . Hypernatremia   . Hypertension   . Hypokalemia   . Low back pain   . Migraines   . Osteoarthritis   . Osteoporosis   . PBA (pseudobulbar affect)   . Post-ictal state (HCC)   . Seizures (HCC)   . Urinary tract infection with hematuria   . Vitamin B12 deficiency     Past Surgical History:  Procedure Laterality Date  . ABDOMINAL HYSTERECTOMY    . CHOLECYSTECTOMY    . KNEE SURGERY     total    Allergies as of 05/09/2018      Reactions   Penicillins Other (See Comments)   Tolerated Ceftriaxone 04/2018 Other reaction(s): UNKNOWN REACTION      Medication List        Accurate as of 05/09/18  1:51 PM. Always use your most recent med list.          amLODipine 10 MG tablet Commonly known as:  NORVASC Take 10 mg by mouth daily.   aspirin EC 81 MG EC tablet Generic drug:  aspirin Take 81 mg by mouth daily. Swallow whole.   bisacodyl 10 MG suppository Commonly known as:  DULCOLAX Place 10 mg rectally daily as needed for moderate constipation (if not relieved by MOM).   bisacodyl 10 MG/30ML Enem Commonly known as:  FLEET Place 10 mg rectally daily as needed (if not relieved by Bisacodyl suppository).   calcium carbonate 1250 (500 Ca) MG chewable tablet Commonly known as:  OS-CAL Chew 1 tablet by mouth daily.   D3-1000 1000 units tablet Generic drug:  Cholecalciferol Take 1,000 Units by mouth daily.   FISH OIL PO Take by mouth. 1,600 MG/5 ML LIQUID- take 5 ml po daily as supplement   hydrALAZINE 50 MG tablet Commonly known as:  APRESOLINE Take 50 mg by mouth 2 (two) times daily.   levETIRAcetam 100 MG/ML solution Commonly known as:  KEPPRA Take 5 mLs (500 mg total) by mouth 2 (two) times daily.   MILK OF MAGNESIA PO Take 30 mLs by mouth daily as needed (if no BM in 3  days).   NAMENDA XR 28 MG Cp24 24 hr capsule Generic drug:  memantine Take 28 mg by mouth at bedtime.   NUEDEXTA 20-10 MG Caps Generic drug:  Dextromethorphan-quiNIDine Take 1 capsule by mouth daily.   NUTRITIONAL SUPPLEMENTS PO Initiate 120 ml Medpass 3 times daily with meals related to weight loss   polyethylene glycol packet Commonly known as:  MIRALAX / GLYCOLAX Take 17 g by mouth daily.   pravastatin 40 MG tablet Commonly known as:  PRAVACHOL Take 40 mg by mouth daily.   rivastigmine 6 MG capsule Commonly known as:  EXELON Take 6 mg by mouth 2 (two) times daily.   vitamin B-12 1000 MCG tablet Commonly known as:  CYANOCOBALAMIN Take 1,000 mcg by mouth daily.       No orders of the defined types were placed in this encounter.   Immunization  History  Administered Date(s) Administered  . Influenza, High Dose Seasonal PF 08/01/2016  . Influenza-Unspecified 07/31/2017  . PPD Test 12/09/2017  . Pneumococcal Conjugate-13 08/01/2016  . Tdap 12/16/2014    Social History   Tobacco Use  . Smoking status: Former Games developer  . Smokeless tobacco: Never Used  Substance Use Topics  . Alcohol use: No    Family history is   Family History  Problem Relation Age of Onset  . Hypertension Mother       Review of Systems unable to obtain secondary to post ictal state      Vitals:   05/09/18 1259  BP: 125/75  Pulse: 78  Resp: 20  Temp: (!) 97.3 F (36.3 C)    SpO2 Readings from Last 1 Encounters:  04/26/18 (P) 100%   Body mass index is 27.12 kg/m.     Physical Exam  GENERAL APPEARANCE: Initially patient is unresponsive SKIN: No diaphoresis rash HEAD: Normocephalic, atraumatic  EYES: Conjunctiva/lids clear. Pupils round, reactive. EOMs intact.  EARS: External exam WNL, canals clear. Hearing grossly normal.  NOSE: No deformity or discharge.  MOUTH/THROAT: Lips w/o lesions  RESPIRATORY: Breathing is even, unlabored. Lung sounds are clear   CARDIOVASCULAR: Heart RRR no murmurs, rubs or gallops. No peripheral edema.   GASTROINTESTINAL: Abdomen is soft, non-tender, not distended w/ normal bowel sounds. GENITOURINARY: Bladder non tender, not distended  MUSCULOSKELETAL: No abnormal joints or musculature NEUROLOGIC:  Cranial nerves 2-12 grossly intact. Moves all extremities; patient is beginning to move purposefully and to open eyes to name PSYCHIATRIC: Not applicable at the moment, no behavioral issues  Patient Active Problem List   Diagnosis Date Noted  . Enterococcus UTI 04/29/2018  . CAP (community acquired pneumonia) 04/25/2018  . Acute encephalopathy 04/22/2018  . HCAP (healthcare-associated pneumonia) 04/22/2018  . Post-ictal state (HCC) 02/18/2018  . Urinary tract infection with hematuria   . Altered mental  status   . Seizure (HCC) 02/11/2018  . Chronic constipation 02/09/2018  . Dementia 12/16/2017  . Failure to thrive in adult 12/16/2017  . Hypernatremia 12/16/2017  . Hypokalemia 12/16/2017  . Hypertension 12/16/2017  . 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 Hyperlipidemia 12/16/2017  . Osteoporosis 12/16/2017  . Vitamin B12 deficiency 12/16/2017  . Depression 12/16/2017      Labs reviewed: Basic Metabolic Panel:    Component Value Date/Time   NA 145 05/01/2018   K 3.8 05/01/2018   CL 107 04/25/2018 0642   CO2 25 04/25/2018 0642   GLUCOSE 85 04/25/2018 0642   BUN 15 05/01/2018   CREATININE 0.5 05/01/2018   CREATININE 0.58 04/25/2018 0642   CALCIUM  8.9 04/25/2018 0642   PROT 5.8 (L) 04/23/2018 0822   ALBUMIN 2.9 (L) 04/23/2018 0822   AST 23 04/23/2018 0822   ALT 10 04/23/2018 0822   ALKPHOS 73 04/23/2018 0822   BILITOT 1.2 04/23/2018 0822   GFRNONAA >60 04/25/2018 0642   GFRAA >60 04/25/2018 0642    Recent Labs    04/22/18 2034 04/22/18 2043 04/23/18 0822 04/25/18 0642 05/01/18  NA 145 147* 147* 142 145  K 3.2* 3.2* 4.5 3.5 3.8  CL 109 109 114* 107  --   CO2 25  --  27 25  --   GLUCOSE 163* 156* 95 85  --   BUN 13 14 10  <5* 15  CREATININE 0.88 0.80 0.61 0.58 0.5  CALCIUM 9.3  --  8.7* 8.9  --    Liver Function Tests: Recent Labs    02/11/18 1325 04/22/18 2034 04/23/18 0822  AST 23 20 23   ALT 8* 11 10  ALKPHOS 78 77 73  BILITOT 0.9 0.9 1.2  PROT 5.7* 6.7 5.8*  ALBUMIN 3.0* 3.3* 2.9*   Recent Labs    04/22/18 2034  LIPASE 38   No results for input(s): AMMONIA in the last 8760 hours. CBC: Recent Labs    10/22/17 1827  02/11/18 1325  02/13/18 1002 04/22/18 2034 04/22/18 2043 04/23/18 0822 05/01/18  WBC 3.9*   < > 7.5  7.5   < > 10.5 7.1  --  7.9 4.7  NEUTROABS 2.2  --  3.6  --   --  3.8  --   --   --   HGB 16.4*   < > 13.2  13.2   < > 13.4 13.5 13.6 13.1 12.7  HCT 48.4*   < > 41.2  41   < > 41.1 42.4 40.0 39.8 39  MCV 96.0  --  98.3    < > 97.9 96.8  --  93.4  --   PLT 120*   < > PLATELET CLUMPS NOTED ON SMEAR, COUNT APPEARS ADEQUATE   < > 130* 153  --  117* 169   < > = values in this interval not displayed.   Lipid Recent Labs    02/12/18 0521  CHOL 152  HDL 49  LDLCALC 99  TRIG 21    Cardiac Enzymes: No results for input(s): CKTOTAL, CKMB, CKMBINDEX, TROPONINI in the last 8760 hours. BNP: No results for input(s): BNP in the last 8760 hours. No results found for: [facility] Lab Results  Component Value Date   HGBA1C 4.2 (L) 02/12/2018   Lab Results  Component Value Date   TSH 3.486 04/25/2018   No results found for: VITAMINB12 No results found for: FOLATE No results found for: IRON, TIBC, FERRITIN  Imaging and Procedures obtained prior to SNF admission: Mr Brain Wo Contrast  Result Date: 04/23/2018 CLINICAL DATA:  Encephalopathy EXAM: MRI HEAD WITHOUT CONTRAST TECHNIQUE: Multiplanar, multiecho pulse sequences of the brain and surrounding structures were obtained without intravenous contrast. COMPARISON:  02/12/2018 FINDINGS: Brain: No acute infarction, hemorrhage, hydrocephalus, extra-axial collection or mass lesion. Atrophy, advanced in the medial temporal lobes. Advanced chronic small vessel ischemia with confluent gliosis in the deep cerebral white matter. Multiple remote micro hemorrhages clustered in the posterior superficial cerebrum. Vascular: Major flow voids are preserved Skull and upper cervical spine: No evidence of marrow lesion. Sinuses/Orbits: No acute finding IMPRESSION: 1. Significantly motion degraded exam without acute finding. 2. Advanced medial temporal atrophy in keeping with history of Alzheimer's disease. 3. Moderate  to advanced chronic small vessel ischemia in the cerebral white matter. 4. Multiple remote micro hemorrhages clustered along posterior cerebral convexities, question previous trauma given this pattern. 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 Electronically Signed   By:  Marnee SpringJonathon  Watts M.D.   On: 04/23/2018 21:52   Dg Chest Port 1 View  Result Date: 04/22/2018 CLINICAL DATA:  Weakness. EXAM: PORTABLE CHEST 1 VIEW COMPARISON:  02/11/2018 FINDINGS: Airspace opacity newly seen at the right base. No Kerley lines, effusion, or pneumothorax. Normal heart size and mediastinal contours. IMPRESSION: Pneumonia on the right. Followup PA and lateral chest X-ray is recommended in 3-4 weeks following trial of antibiotic therapy to ensure resolution. Electronically Signed   By: Marnee SpringJonathon  Watts M.D.   On: 04/22/2018 22:31   Ct Head Code Stroke Wo Contrast  Result Date: 04/22/2018 CLINICAL DATA:  Code stroke.  77 y/o  F; decreased responsiveness. EXAM: CT HEAD WITHOUT CONTRAST TECHNIQUE: Contiguous axial images were obtained from the base of the skull through the vertex without intravenous contrast. COMPARISON:  02/12/2018 MRI head.  02/11/2017 CT head. FINDINGS: Brain: No evidence of acute infarction, hemorrhage, hydrocephalus, extra-axial collection or mass lesion/mass effect. Stable chronic microvascular ischemic changes and parenchymal volume loss of the brain. Vascular: Calcific atherosclerosis of carotid siphons. No hyperdense vessel identified. Skull: Normal. Negative for fracture or focal lesion. Sinuses/Orbits: No acute finding. Other: None. ASPECTS [facility]([place] Stroke Program Early CT Score) - Ganglionic level infarction (caudate, lentiform nuclei, internal capsule, insula, M1-M3 cortex): 7 - Supraganglionic infarction (M4-M6 cortex): 3 Total score (0-10 with 10 being normal): 10 IMPRESSION: 1. No acute intracranial abnormality identified. 2. Stable chronic microvascular ischemic changes and parenchymal volume loss of the brain. 3. ASPECTS is 10 These results were communicated to Dr. Laurence SlateAroor at 8:57 pmon 7/7/2019by text page via the [facility] messaging system. Electronically Signed   By: Mitzi HansenLance  Furusawa-Stratton M.D.   On: 04/22/2018 20:57     Not all labs, radiology exams or other  studies done during hospitalization come through on my EPIC note; however they are reviewed by me.    Assessment and Plan  Breakthrough seizure- patient with history of seizure on Keppra 500 mg twice daily; patient has had breakthrough seizure prior; spoke with daughter who wishes patient to be sent to the ED   Time spent greater than 35 minutes  Viveca Beckstrom D. Lyn HollingsheadAlexander, MD

## 2018-05-10 ENCOUNTER — Other Ambulatory Visit: Payer: Self-pay

## 2018-05-10 NOTE — Patient Outreach (Signed)
[facility] Care Management  05/10/2018  Richardson ChiquitoBarbara S Moffatt 1941/04/16 [id number]   Medication Adherence call to Mrs. Grayland JackBarbara Lauder  spoke with patient's daughter she said Mr. [name] is at a rehab at this time and they provide with all her medications patient is showing past due on Pravastatin 40 mg. Mrs. Leiner is showing past due under [facility].   Lillia AbedAna Ollison-Moran CPhT Pharmacy Technician [facility] Care Management Direct Dial [phone]  Fax [phone] [email or web address]

## 2018-05-21 ENCOUNTER — Encounter: Payer: Self-pay | Admitting: Internal Medicine

## 2018-05-21 ENCOUNTER — Non-Acute Institutional Stay (SKILLED_NURSING_FACILITY): Payer: Medicare Other | Admitting: Internal Medicine

## 2018-05-21 DIAGNOSIS — G40909 Epilepsy, unspecified, not intractable, without status epilepticus: Secondary | ICD-10-CM

## 2018-05-21 NOTE — Progress Notes (Signed)
:   Location:  [facility] Room Number: 203D Place of Service:  SNF (31)  Randon Goldsmith. Lyn Hollingshead, MD  Patient Care Team: Patient, No Pcp Per as PCP - General (General Practice)  Extended Emergency Contact Information Primary Emergency Contact: [name],[name] Address: [address] [place], [place] [address] [place] Home Phone: [phone] Relation: Daughter Secondary Emergency Contact: [name] Mobile Phone: [phone] Relation: Daughter     Allergies: Penicillins  Chief Complaint  Patient presents with  . Acute Visit    Seizure    HPI: Patient is 77 y.o. female who is being seen urgently for a seizure.  Seizure onset in the hall while patient is in her wheelchair.  Patient did not fall out of her wheelchair.  Seizure lasted approximately 20 to 30 seconds, patient did wet her diaper during the seizure.  Patient was unresponsive after the seizure but 5 minutes later began to open her eyes to command and display purposeful movements to noxious stimuli.  Patient did not desaturate.  Patient has had several breakthrough seizures recently.  Will call family to find out if they would like her to be sent to the ED although I do not think she needs to, she needs to be seen by neurology..  Past Medical History:  Diagnosis Date  . Acute encephalopathy   . Altered mental status   . Alzheimer's disease   . Chronic constipation   . Dementia   . Dementia   . Depressed   . Failure to thrive in adult   . HCAP (healthcare-associated pneumonia)   . Hyperlipidemia   . Hypernatremia   . Hypertension   . Hypokalemia   . Low back pain   . Migraines   . Osteoarthritis   . Osteoporosis   . PBA (pseudobulbar affect)   . Post-ictal state (HCC)   . Seizures (HCC)   . Urinary tract infection with hematuria   . Vitamin B12 deficiency     Past Surgical History:  Procedure Laterality Date  . ABDOMINAL HYSTERECTOMY    .  CHOLECYSTECTOMY    . KNEE SURGERY     total    Allergies as of 05/21/2018      Reactions   Penicillins Other (See Comments)   Tolerated Ceftriaxone 04/2018 Other reaction(s): UNKNOWN REACTION      Medication List        Accurate as of 05/21/18  4:12 PM. Always use your most recent med list.          amLODipine 10 MG tablet Commonly known as:  NORVASC Take 10 mg by mouth daily.   aspirin EC 81 MG EC tablet Generic drug:  aspirin Take 81 mg by mouth daily. Swallow whole.   bisacodyl 10 MG suppository Commonly known as:  DULCOLAX Place 10 mg rectally daily as needed for moderate constipation (if not relieved by MOM).   bisacodyl 10 MG/30ML Enem Commonly known as:  FLEET Place 10 mg rectally daily as needed (if not relieved by Bisacodyl suppository).   calcium carbonate 1250 (500 Ca) MG chewable tablet Commonly known as:  OS-CAL Chew 1 tablet by mouth daily.   D3-1000 1000 units tablet Generic drug:  Cholecalciferol Take 1,000 Units by mouth daily.   FISH OIL PO Take by mouth. 1,600 MG/5 ML LIQUID- take 5 ml po daily as supplement   hydrALAZINE 50 MG tablet Commonly known as:  APRESOLINE Take 50  mg by mouth 2 (two) times daily.   levETIRAcetam 100 MG/ML solution Commonly known as:  KEPPRA Take 5 mLs (500 mg total) by mouth 2 (two) times daily.   MILK OF MAGNESIA PO Take 30 mLs by mouth daily as needed (if no BM in 3 days).   NAMENDA XR 28 MG Cp24 24 hr capsule Generic drug:  memantine Take 28 mg by mouth at bedtime.   NUEDEXTA 20-10 MG Caps Generic drug:  Dextromethorphan-quiNIDine Take 1 capsule by mouth daily.   NUTRITIONAL SUPPLEMENTS PO Initiate 120 ml Medpass 3 times daily with meals related to weight loss   polyethylene glycol packet Commonly known as:  MIRALAX / GLYCOLAX Take 17 g by mouth daily.   pravastatin 40 MG tablet Commonly known as:  PRAVACHOL Take 40 mg by mouth daily.   rivastigmine 6 MG capsule Commonly known as:  EXELON Take 6  mg by mouth 2 (two) times daily.   vitamin B-12 1000 MCG tablet Commonly known as:  CYANOCOBALAMIN Take 1,000 mcg by mouth daily.       No orders of the defined types were placed in this encounter.   Immunization History  Administered Date(s) Administered  . Influenza, High Dose Seasonal PF 08/01/2016  . Influenza-Unspecified 07/31/2017  . PPD Test 12/09/2017  . Pneumococcal Conjugate-13 08/01/2016  . Tdap 12/16/2014    Social History   Tobacco Use  . Smoking status: Former Games developermoker  . Smokeless tobacco: Never Used  Substance Use Topics  . Alcohol use: No    Family history is   Family History  Problem Relation Age of Onset  . Hypertension Mother       Review of Systems able to obtain secondary to postictal state     Vitals:   05/21/18 1612  BP: (!) 128/59  Pulse: (!) 58  Temp: (!) 97.5 F (36.4 C)  SpO2: 98%    SpO2 Readings from Last 1 Encounters:  05/21/18 98%   Body mass index is 27.12 kg/m.     Physical Exam  GENERAL APPEARANCE: Initially patient is unresponsive but is just beginning to have rales.  SKIN: No diaphoresis rash HEAD: Normocephalic, atraumatic  EYES: Conjunctiva/lids clear. Pupils round, reactive. EOMs intact.  EARS: External exam WNL, canals clear. Hearing grossly normal.  NOSE: No deformity or discharge.  MOUTH/THROAT: Lips w/o lesions  RESPIRATORY: Breathing is even, unlabored. Lung sounds are clear   CARDIOVASCULAR: Heart RRR no murmurs, rubs or gallops. No peripheral edema.   GASTROINTESTINAL: Abdomen is soft, non-tender, not distended w/ normal bowel sounds. GENITOURINARY: Bladder non tender, not distended  MUSCULOSKELETAL: No abnormal joints or musculature NEUROLOGIC:  Cranial nerves 2-12 grossly intact. Moves all extremities; patient beginning to move purposefully to noxious stimuli and open eyes to name PSYCHIATRIC: Not applicable at the moment, no behavioral issues  Patient Active Problem List   Diagnosis Date Noted   . Enterococcus UTI 04/29/2018  . CAP (community acquired pneumonia) 04/25/2018  . Acute encephalopathy 04/22/2018  . HCAP (healthcare-associated pneumonia) 04/22/2018  . Post-ictal state (HCC) 02/18/2018  . Urinary tract infection with hematuria   . Altered mental status   . Seizure (HCC) 02/11/2018  . Chronic constipation 02/09/2018  . Dementia 12/16/2017  . Failure to thrive in adult 12/16/2017  . Hypernatremia 12/16/2017  . Hypokalemia 12/16/2017  . Hypertension 12/16/2017  . 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 Hyperlipidemia 12/16/2017  . Osteoporosis 12/16/2017  . Vitamin B12 deficiency 12/16/2017  . Depression 12/16/2017      Labs  reviewed: Basic Metabolic Panel:    Component Value Date/Time   NA 145 05/01/2018   K 3.8 05/01/2018   CL 107 04/25/2018 0642   CO2 25 04/25/2018 0642   GLUCOSE 85 04/25/2018 0642   BUN 15 05/01/2018   CREATININE 0.5 05/01/2018   CREATININE 0.58 04/25/2018 0642   CALCIUM 8.9 04/25/2018 0642   PROT 5.8 (L) 04/23/2018 0822   ALBUMIN 2.9 (L) 04/23/2018 0822   AST 23 04/23/2018 0822   ALT 10 04/23/2018 0822   ALKPHOS 73 04/23/2018 0822   BILITOT 1.2 04/23/2018 0822   GFRNONAA >60 04/25/2018 0642   GFRAA >60 04/25/2018 0642    Recent Labs    04/22/18 2034 04/22/18 2043 04/23/18 0822 04/25/18 0642 05/01/18  NA 145 147* 147* 142 145  K 3.2* 3.2* 4.5 3.5 3.8  CL 109 109 114* 107  --   CO2 25  --  27 25  --   GLUCOSE 163* 156* 95 85  --   BUN 13 14 10  <5* 15  CREATININE 0.88 0.80 0.61 0.58 0.5  CALCIUM 9.3  --  8.7* 8.9  --    Liver Function Tests: Recent Labs    02/11/18 1325 04/22/18 2034 04/23/18 0822  AST 23 20 23   ALT 8* 11 10  ALKPHOS 78 77 73  BILITOT 0.9 0.9 1.2  PROT 5.7* 6.7 5.8*  ALBUMIN 3.0* 3.3* 2.9*   Recent Labs    04/22/18 2034  LIPASE 38   No results for input(s): AMMONIA in the last 8760 hours. CBC: Recent Labs    10/22/17 1827  02/11/18 1325  02/13/18 1002 04/22/18 2034  04/22/18 2043 04/23/18 0822 05/01/18  WBC 3.9*   < > 7.5  7.5   < > 10.5 7.1  --  7.9 4.7  NEUTROABS 2.2  --  3.6  --   --  3.8  --   --   --   HGB 16.4*   < > 13.2  13.2   < > 13.4 13.5 13.6 13.1 12.7  HCT 48.4*   < > 41.2  41   < > 41.1 42.4 40.0 39.8 39  MCV 96.0  --  98.3   < > 97.9 96.8  --  93.4  --   PLT 120*   < > PLATELET CLUMPS NOTED ON SMEAR, COUNT APPEARS ADEQUATE   < > 130* 153  --  117* 169   < > = values in this interval not displayed.   Lipid Recent Labs    02/12/18 0521  CHOL 152  HDL 49  LDLCALC 99  TRIG 21    Cardiac Enzymes: No results for input(s): CKTOTAL, CKMB, CKMBINDEX, TROPONINI in the last 8760 hours. BNP: No results for input(s): BNP in the last 8760 hours. No results found for: [facility] Lab Results  Component Value Date   HGBA1C 4.2 (L) 02/12/2018   Lab Results  Component Value Date   TSH 3.486 04/25/2018   No results found for: VITAMINB12 No results found for: FOLATE No results found for: IRON, TIBC, FERRITIN  Imaging and Procedures obtained prior to SNF admission: Mr Brain Wo Contrast  Result Date: 04/23/2018 CLINICAL DATA:  Encephalopathy EXAM: MRI HEAD WITHOUT CONTRAST TECHNIQUE: Multiplanar, multiecho pulse sequences of the brain and surrounding structures were obtained without intravenous contrast. COMPARISON:  02/12/2018 FINDINGS: Brain: No acute infarction, hemorrhage, hydrocephalus, extra-axial collection or mass lesion. Atrophy, advanced in the medial temporal lobes. Advanced chronic small vessel ischemia with confluent gliosis in the  deep cerebral white matter. Multiple remote micro hemorrhages clustered in the posterior superficial cerebrum. Vascular: Major flow voids are preserved Skull and upper cervical spine: No evidence of marrow lesion. Sinuses/Orbits: No acute finding IMPRESSION: 1. Significantly motion degraded exam without acute finding. 2. Advanced medial temporal atrophy in keeping with history of Alzheimer's disease. 3.  Moderate to advanced chronic small vessel ischemia in the cerebral white matter. 4. Multiple remote micro hemorrhages clustered along posterior cerebral convexities, question previous trauma given this pattern. 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 Electronically Signed   By: Marnee Spring M.D.   On: 04/23/2018 21:52   Dg Chest Port 1 View  Result Date: 04/22/2018 CLINICAL DATA:  Weakness. EXAM: PORTABLE CHEST 1 VIEW COMPARISON:  02/11/2018 FINDINGS: Airspace opacity newly seen at the right base. No Kerley lines, effusion, or pneumothorax. Normal heart size and mediastinal contours. IMPRESSION: Pneumonia on the right. Followup PA and lateral chest X-ray is recommended in 3-4 weeks following trial of antibiotic therapy to ensure resolution. Electronically Signed   By: Marnee Spring M.D.   On: 04/22/2018 22:31   Ct Head Code Stroke Wo Contrast  Result Date: 04/22/2018 CLINICAL DATA:  Code stroke.  77 y/o  F; decreased responsiveness. EXAM: CT HEAD WITHOUT CONTRAST TECHNIQUE: Contiguous axial images were obtained from the base of the skull through the vertex without intravenous contrast. COMPARISON:  02/12/2018 MRI head.  02/11/2017 CT head. FINDINGS: Brain: No evidence of acute infarction, hemorrhage, hydrocephalus, extra-axial collection or mass lesion/mass effect. Stable chronic microvascular ischemic changes and parenchymal volume loss of the brain. Vascular: Calcific atherosclerosis of carotid siphons. No hyperdense vessel identified. Skull: Normal. Negative for fracture or focal lesion. Sinuses/Orbits: No acute finding. Other: None. ASPECTS [facility] Stroke Program Early CT Score) - Ganglionic level infarction (caudate, lentiform nuclei, internal capsule, insula, M1-M3 cortex): 7 - Supraganglionic infarction (M4-M6 cortex): 3 Total score (0-10 with 10 being normal): 10 IMPRESSION: 1. No acute intracranial abnormality identified. 2. Stable chronic microvascular ischemic changes and parenchymal  volume loss of the brain. 3. ASPECTS is 10 These results were communicated to Dr. Laurence Slate at 8:57 pmon 7/7/2019by text page via the [facility] messaging system. Electronically Signed   By: Mitzi Hansen M.D.   On: 04/22/2018 20:57     Not all labs, radiology exams or other studies done during hospitalization come through on my EPIC note; however they are reviewed by me.    Assessment and Plan  Recurrent seizures- I spoke to both daughters, each individually, and neither 1 desired for patient to go to the hospital.  Past several hospitalizations after seizures patient was diagnosed as a UTI as a cause of recurrent seizure, neurology was not consulted and no changes were made to medications.  They did wish for patient to see neurology with which I concur and I have written for neurology consult    Time spent greater than 35 minutes;> 50% of time with patient was spent reviewing records, labs, tests and studies, counseling and developing plan of care  Thurston Hole D. Lyn Hollingshead, MD

## 2018-05-23 ENCOUNTER — Encounter: Payer: Self-pay | Admitting: Internal Medicine

## 2018-05-23 ENCOUNTER — Non-Acute Institutional Stay (SKILLED_NURSING_FACILITY): Payer: Medicare Other | Admitting: Internal Medicine

## 2018-05-23 DIAGNOSIS — R569 Unspecified convulsions: Secondary | ICD-10-CM | POA: Diagnosis not present

## 2018-05-23 DIAGNOSIS — F482 Pseudobulbar affect: Secondary | ICD-10-CM

## 2018-05-23 DIAGNOSIS — K5909 Other constipation: Secondary | ICD-10-CM

## 2018-05-23 NOTE — Progress Notes (Signed)
[facility] Room Number: 203D Place of Service:  SNF [phone](31)  Melissa Goldsmithnne D. Lyn HollingsheadAlexander, MD  Patient Care Team: Patient, No Pcp Per as PCP - General (General Practice)  Extended Emergency Contact Information Primary Emergency Contact: [name],[name] Address: [address]          [place], [place] [address] [place] Home Phone: [phone] Relation: Daughter Secondary Emergency Contact: [name], Tracy Mobile Phone: [phone] Relation: Daughter    Allergies: Penicillins  Chief Complaint  Patient presents with  . Medical Management of Chronic Issues    Routine Visit    HPI: Patient is 77 y.o. female who is seen for routine issues of seizures, constipation, and PBA.  Past Medical History:  Diagnosis Date  . Acute encephalopathy   . Altered mental status   . Alzheimer's disease   . Chronic constipation   . Dementia   . Dementia   . Depressed   . Failure to thrive in adult   . HCAP (healthcare-associated pneumonia)   . Hyperlipidemia   . Hypernatremia   . Hypertension   . Hypokalemia   . Low back pain   . Migraines   . Osteoarthritis   . Osteoporosis   . PBA (pseudobulbar affect)   . Post-ictal state (HCC)   . Seizures (HCC)   . Urinary tract infection with hematuria   . Vitamin B12 deficiency     Past Surgical History:  Procedure Laterality Date  . ABDOMINAL HYSTERECTOMY    . CHOLECYSTECTOMY    . KNEE SURGERY     total    Allergies as of 05/23/2018      Reactions   Penicillins Other (See Comments)   Tolerated Ceftriaxone 04/2018 Other reaction(s): UNKNOWN REACTION      Medication List        Accurate as of 05/23/18 11:59 PM. Always use your most recent med list.          amLODipine 10 MG tablet Commonly known as:  NORVASC Take 10 mg by mouth daily.   aspirin EC 81 MG EC tablet Generic drug:  aspirin Take 81 mg by mouth daily. Swallow whole.   bisacodyl 10 MG suppository Commonly known  as:  DULCOLAX Place 10 mg rectally daily as needed for moderate constipation (if not relieved by MOM).   bisacodyl 10 MG/30ML Enem Commonly known as:  FLEET Place 10 mg rectally daily as needed (if not relieved by Bisacodyl suppository).   calcium carbonate 1250 (500 Ca) MG chewable tablet Commonly known as:  OS-CAL Chew 1 tablet by mouth daily.   D3-1000 1000 units tablet Generic drug:  Cholecalciferol Take 1,000 Units by mouth daily.   FISH OIL PO Take by mouth. 1,600 MG/5 ML LIQUID- take 5 ml po daily as supplement   hydrALAZINE 50 MG tablet Commonly known as:  APRESOLINE Take 50 mg by mouth 2 (two) times daily.   levETIRAcetam 100 MG/ML solution Commonly known as:  KEPPRA Take 5 mLs (500 mg total) by mouth 2 (two) times daily.   MILK OF MAGNESIA PO Take 30 mLs by mouth daily as needed (if no BM in 3 days).   NAMENDA XR 28 MG Cp24 24 hr capsule Generic drug:  memantine Take 28 mg by mouth at bedtime.   NUEDEXTA 20-10 MG Caps Generic drug:  Dextromethorphan-quiNIDine Take 1 capsule by mouth daily.   NUTRITIONAL SUPPLEMENTS PO Initiate 120 ml Medpass 3 times daily with meals related to weight loss  polyethylene glycol packet Commonly known as:  MIRALAX / GLYCOLAX Take 17 g by mouth daily.   pravastatin 40 MG tablet Commonly known as:  PRAVACHOL Take 40 mg by mouth daily.   rivastigmine 6 MG capsule Commonly known as:  EXELON Take 6 mg by mouth 2 (two) times daily.   vitamin B-12 1000 MCG tablet Commonly known as:  CYANOCOBALAMIN Take 1,000 mcg by mouth daily.       No orders of the defined types were placed in this encounter.   Immunization History  Administered Date(s) Administered  . Influenza, High Dose Seasonal PF 08/01/2016  . Influenza-Unspecified 07/31/2017  . PPD Test 12/09/2017  . Pneumococcal Conjugate-13 08/01/2016  . Tdap 12/16/2014    Social History   Tobacco Use  . Smoking status: Former Games developer  . Smokeless tobacco: Never Used   Substance Use Topics  . Alcohol use: No    Review of Systems  DATA OBTAINED: from patient limited; nursing- only concerns are recurrent seizures GENERAL:  no fevers, fatigue, appetite changes SKIN: No itching, rash HEENT: No complaint RESPIRATORY: No cough, wheezing, SOB CARDIAC: No chest pain, palpitations, lower extremity edema  GI: No abdominal pain, No N/V/D or constipation, No heartburn or reflux  GU: No dysuria, frequency or urgency, or incontinence  MUSCULOSKELETAL: No unrelieved bone/joint pain NEUROLOGIC: No headache, dizziness  PSYCHIATRIC: No overt anxiety or sadness  Vitals:   05/23/18 1536  BP: 120/70  Pulse: 64  Resp: 18  Temp: (!) 97 F (36.1 C)   Body mass index is 27.12 kg/m. Physical Exam  GENERAL APPEARANCE: Alert, conversant, No acute distress  SKIN: No diaphoresis rash HEENT: Unremarkable RESPIRATORY: Breathing is even, unlabored. Lung sounds are clear   CARDIOVASCULAR: Heart RRR no murmurs, rubs or gallops. No peripheral edema  GASTROINTESTINAL: Abdomen is soft, non-tender, not distended w/ normal bowel sounds.  GENITOURINARY: Bladder non tender, not distended  MUSCULOSKELETAL: No abnormal joints or musculature NEUROLOGIC: Cranial nerves 2-12 grossly intact. Moves all extremities PSYCHIATRIC: Mood and affect dementia, no behavioral issues  Patient Active Problem List   Diagnosis Date Noted  . Enterococcus UTI 04/29/2018  . CAP (community acquired pneumonia) 04/25/2018  . Acute encephalopathy 04/22/2018  . HCAP (healthcare-associated pneumonia) 04/22/2018  . Post-ictal state (HCC) 02/18/2018  . Urinary tract infection with hematuria   . Altered mental status   . Seizure (HCC) 02/11/2018  . Chronic constipation 02/09/2018  . Dementia 12/16/2017  . Failure to thrive in adult 12/16/2017  . Hypernatremia 12/16/2017  . Hypokalemia 12/16/2017  . Hypertension 12/16/2017  . 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 Hyperlipidemia 12/16/2017  .  Osteoporosis 12/16/2017  . Vitamin B12 deficiency 12/16/2017  . Depression 12/16/2017    CMP     Component Value Date/Time   NA 145 05/01/2018   K 3.8 05/01/2018   CL 107 04/25/2018 0642   CO2 25 04/25/2018 0642   GLUCOSE 85 04/25/2018 0642   BUN 15 05/01/2018   CREATININE 0.5 05/01/2018   CREATININE 0.58 04/25/2018 0642   CALCIUM 8.9 04/25/2018 0642   PROT 5.8 (L) 04/23/2018 0822   ALBUMIN 2.9 (L) 04/23/2018 0822   AST 23 04/23/2018 0822   ALT 10 04/23/2018 0822   ALKPHOS 73 04/23/2018 0822   BILITOT 1.2 04/23/2018 0822   GFRNONAA >60 04/25/2018 0642   GFRAA >60 04/25/2018 0642   Recent Labs    04/22/18 2034 04/22/18 2043 04/23/18 0822 04/25/18 0642 05/01/18  NA 145 147* 147* 142 145  K 3.2* 3.2* 4.5  3.5 3.8  CL 109 109 114* 107  --   CO2 25  --  27 25  --   GLUCOSE 163* 156* 95 85  --   BUN 13 14 10  <5* 15  CREATININE 0.88 0.80 0.61 0.58 0.5  CALCIUM 9.3  --  8.7* 8.9  --    Recent Labs    02/11/18 1325 04/22/18 2034 04/23/18 0822  AST 23 20 23   ALT 8* 11 10  ALKPHOS 78 77 73  BILITOT 0.9 0.9 1.2  PROT 5.7* 6.7 5.8*  ALBUMIN 3.0* 3.3* 2.9*   Recent Labs    10/22/17 1827  02/11/18 1325  02/13/18 1002 04/22/18 2034 04/22/18 2043 04/23/18 0822 05/01/18  WBC 3.9*   < > 7.5  7.5   < > 10.5 7.1  --  7.9 4.7  NEUTROABS 2.2  --  3.6  --   --  3.8  --   --   --   HGB 16.4*   < > 13.2  13.2   < > 13.4 13.5 13.6 13.1 12.7  HCT 48.4*   < > 41.2  41   < > 41.1 42.4 40.0 39.8 39  MCV 96.0  --  98.3   < > 97.9 96.8  --  93.4  --   PLT 120*   < > PLATELET CLUMPS NOTED ON SMEAR, COUNT APPEARS ADEQUATE   < > 130* 153  --  117* 169   < > = values in this interval not displayed.   Recent Labs    02/12/18 0521  CHOL 152  LDLCALC 99  TRIG 21   No results found for: [facility] Lab Results  Component Value Date   TSH 3.486 04/25/2018   Lab Results  Component Value Date   HGBA1C 4.2 (L) 02/12/2018   Lab Results  Component Value Date   CHOL 152  02/12/2018   HDL 49 02/12/2018   LDLCALC 99 02/12/2018   TRIG 21 02/12/2018   CHOLHDL 3.1 02/12/2018    Significant Diagnostic Results in last 30 days:  No results found.  Assessment and Plan  Seizure [facility]) Is been having breakthrough seizures, short acting with a short postictal period; has set patient up for neurology appointment to review medications; continue Keppra 500 mg twice daily until then  Chronic constipation Chronic and stable patient is on MiraLAX 17 g daily with PRN's a fleets enema, Dulcolax suppositories and milk of magnesia; continue current regimen  PBA (pseudobulbar affect) Is been controlled on Nuedexta 10/21 p.o. daily     Thurston Hole D. Lyn Hollingshead, MD

## 2018-05-29 ENCOUNTER — Encounter: Payer: Self-pay | Admitting: Internal Medicine

## 2018-05-31 ENCOUNTER — Encounter: Payer: Self-pay | Admitting: Internal Medicine

## 2018-05-31 NOTE — Assessment & Plan Note (Signed)
Is been having breakthrough seizures, short acting with a short postictal period; has set patient up for neurology appointment to review medications; continue Keppra 500 mg twice daily until then

## 2018-05-31 NOTE — Assessment & Plan Note (Signed)
Is been controlled on Nuedexta 10/21 p.o. daily

## 2018-05-31 NOTE — Assessment & Plan Note (Signed)
Chronic and stable patient is on MiraLAX 17 g daily with PRN's a fleets enema, Dulcolax suppositories and milk of magnesia; continue current regimen

## 2018-06-07 DIAGNOSIS — G301 Alzheimer's disease with late onset: Secondary | ICD-10-CM | POA: Diagnosis not present

## 2018-06-19 ENCOUNTER — Encounter: Payer: Self-pay | Admitting: Internal Medicine

## 2018-06-19 ENCOUNTER — Non-Acute Institutional Stay (SKILLED_NURSING_FACILITY): Payer: Medicare Other | Admitting: Internal Medicine

## 2018-06-19 DIAGNOSIS — G300 Alzheimer's disease with early onset: Secondary | ICD-10-CM

## 2018-06-19 DIAGNOSIS — I10 Essential (primary) hypertension: Secondary | ICD-10-CM

## 2018-06-19 DIAGNOSIS — E785 Hyperlipidemia, unspecified: Secondary | ICD-10-CM

## 2018-06-19 DIAGNOSIS — F0280 Dementia in other diseases classified elsewhere without behavioral disturbance: Secondary | ICD-10-CM | POA: Diagnosis not present

## 2018-06-19 NOTE — Progress Notes (Signed)
Location:  [facility] Room Number: 203D Place of Service:  SNF [phone])  Melissa Jarvis. Lyn Hollingshead, MD  Patient Care Team: Patient, No Pcp Per as PCP - General (General Practice)  Extended Emergency Contact Information Primary Emergency Contact: [name],[name] Address: [address] [place], [place] [address] [place] Home Phone: [phone] Relation: Daughter Secondary Emergency Contact: [name] Mobile Phone: [phone] Relation: Daughter    Allergies: Penicillins  Chief Complaint  Patient presents with  . Medical Management of Chronic Issues    Routine Visit    HPI: Patient is 77 y.o. female who is being seen for routine issues of hypertension, dementia, and hyperlipidemia.  Past Medical History:  Diagnosis Date  . Acute encephalopathy   . Altered mental status   . Alzheimer's disease   . Chronic constipation   . Dementia   . Dementia   . Depressed   . Failure to thrive in adult   . HCAP (healthcare-associated pneumonia)   . Hyperlipidemia   . Hypernatremia   . Hypertension   . Hypokalemia   . Low back pain   . Migraines   . Osteoarthritis   . Osteoporosis   . PBA (pseudobulbar affect)   . Post-ictal state (HCC)   . Seizures (HCC)   . Urinary tract infection with hematuria   . Vitamin B12 deficiency     Past Surgical History:  Procedure Laterality Date  . ABDOMINAL HYSTERECTOMY    . CHOLECYSTECTOMY    . KNEE SURGERY     total    Allergies as of 06/19/2018      Reactions   Penicillins Other (See Comments)   Tolerated Ceftriaxone 04/2018 Other reaction(s): UNKNOWN REACTION      Medication List        Accurate as of 06/19/18 11:59 PM. Always use your most recent med list.          amLODipine 10 MG tablet Commonly known as:  NORVASC Take 10 mg by mouth daily.   aspirin EC 81 MG EC tablet Generic drug:  aspirin Take 81 mg by mouth daily. Swallow whole.   bisacodyl 10 MG  suppository Commonly known as:  DULCOLAX Place 10 mg rectally daily as needed for moderate constipation (if not relieved by MOM).   bisacodyl 10 MG/30ML Enem Commonly known as:  FLEET Place 10 mg rectally daily as needed (if not relieved by Bisacodyl suppository).   calcium carbonate 1250 (500 Ca) MG chewable tablet Commonly known as:  OS-CAL Chew 1 tablet by mouth daily.   D3-1000 1000 units tablet Generic drug:  Cholecalciferol Take 1,000 Units by mouth daily.   FISH OIL PO Take by mouth. 1,600 MG/5 ML LIQUID- take 5 ml po daily as supplement   hydrALAZINE 50 MG tablet Commonly known as:  APRESOLINE Take 50 mg by mouth 2 (two) times daily.   levETIRAcetam 100 MG/ML solution Commonly known as:  KEPPRA Take 5 mLs (500 mg total) by mouth 2 (two) times daily.   MILK OF MAGNESIA PO Take 30 mLs by mouth daily as needed (if no BM in 3 days).   NAMENDA XR 28 MG Cp24 24 hr capsule Generic drug:  memantine Take 28 mg by mouth at bedtime.   NUEDEXTA 20-10 MG Caps Generic drug:  Dextromethorphan-quiNIDine Take 1 capsule by mouth daily.   NUTRITIONAL SUPPLEMENTS PO Initiate 120 ml Medpass 3 times daily with meals related to weight  loss   polyethylene glycol packet Commonly known as:  MIRALAX / GLYCOLAX Take 17 g by mouth daily.   pravastatin 40 MG tablet Commonly known as:  PRAVACHOL Take 40 mg by mouth daily.   rivastigmine 6 MG capsule Commonly known as:  EXELON Take 6 mg by mouth 2 (two) times daily.   vitamin B-12 1000 MCG tablet Commonly known as:  CYANOCOBALAMIN Take 1,000 mcg by mouth daily.       No orders of the defined types were placed in this encounter.   Immunization History  Administered Date(s) Administered  . Influenza, High Dose Seasonal PF 08/01/2016  . Influenza-Unspecified 07/31/2017  . PPD Test 12/09/2017  . Pneumococcal Conjugate-13 08/01/2016  . Tdap 12/16/2014    Social History   Tobacco Use  . Smoking status: Former Games developer  .  Smokeless tobacco: Never Used  Substance Use Topics  . Alcohol use: No    Review of Systems  DATA OBTAINED: from patient-limited; nursing-no acute concerns GENERAL:  no fevers, fatigue, appetite changes SKIN: No itching, rash HEENT: No complaint RESPIRATORY: No cough, wheezing, SOB CARDIAC: No chest pain, palpitations, lower extremity edema  GI: No abdominal pain, No N/V/D or constipation, No heartburn or reflux  GU: No dysuria, frequency or urgency, or incontinence  MUSCULOSKELETAL: No unrelieved bone/joint pain NEUROLOGIC: No headache, dizziness  PSYCHIATRIC: No overt anxiety or sadness  Vitals:   06/19/18 1506  BP: 134/66  Pulse: 75  Resp: 18  Temp: 98.1 F (36.7 C)   Body mass index is 26.79 kg/m. Physical Exam  GENERAL APPEARANCE: Alert, conversant, No acute distress  SKIN: No diaphoresis rash HEENT: Unremarkable RESPIRATORY: Breathing is even, unlabored. Lung sounds are clear   CARDIOVASCULAR: Heart RRR no murmurs, rubs or gallops. No peripheral edema  GASTROINTESTINAL: Abdomen is soft, non-tender, not distended w/ normal bowel sounds.  GENITOURINARY: Bladder non tender, not distended  MUSCULOSKELETAL: No abnormal joints or musculature NEUROLOGIC: Cranial nerves 2-12 grossly intact. Moves all extremities PSYCHIATRIC: Mood and affect dementia, no behavioral issues  Patient Active Problem List   Diagnosis Date Noted  . Enterococcus UTI 04/29/2018  . CAP (community acquired pneumonia) 04/25/2018  . Acute encephalopathy 04/22/2018  . HCAP (healthcare-associated pneumonia) 04/22/2018  . Post-ictal state (HCC) 02/18/2018  . Urinary tract infection with hematuria   . Altered mental status   . Seizure (HCC) 02/11/2018  . Chronic constipation 02/09/2018  . Dementia 12/16/2017  . Failure to thrive in adult 12/16/2017  . Hypernatremia 12/16/2017  . Hypokalemia 12/16/2017  . Hypertension 12/16/2017  . 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 Hyperlipidemia  12/16/2017  . Osteoporosis 12/16/2017  . Vitamin B12 deficiency 12/16/2017  . Depression 12/16/2017    CMP     Component Value Date/Time   NA 145 05/01/2018   K 3.8 05/01/2018   CL 107 04/25/2018 0642   CO2 25 04/25/2018 0642   GLUCOSE 85 04/25/2018 0642   BUN 15 05/01/2018   CREATININE 0.5 05/01/2018   CREATININE 0.58 04/25/2018 0642   CALCIUM 8.9 04/25/2018 0642   PROT 5.8 (L) 04/23/2018 0822   ALBUMIN 2.9 (L) 04/23/2018 0822   AST 23 04/23/2018 0822   ALT 10 04/23/2018 0822   ALKPHOS 73 04/23/2018 0822   BILITOT 1.2 04/23/2018 0822   GFRNONAA >60 04/25/2018 0642   GFRAA >60 04/25/2018 0642   Recent Labs    04/22/18 2034 04/22/18 2043 04/23/18 0822 04/25/18 0642 05/01/18  NA 145 147* 147* 142 145  K 3.2* 3.2* 4.5 3.5 3.8  CL 109 109 114* 107  --   CO2 25  --  27 25  --   GLUCOSE 163* 156* 95 85  --   BUN 13 14 10  <5* 15  CREATININE 0.88 0.80 0.61 0.58 0.5  CALCIUM 9.3  --  8.7* 8.9  --    Recent Labs    02/11/18 1325 04/22/18 2034 04/23/18 0822  AST 23 20 23   ALT 8* 11 10  ALKPHOS 78 77 73  BILITOT 0.9 0.9 1.2  PROT 5.7* 6.7 5.8*  ALBUMIN 3.0* 3.3* 2.9*   Recent Labs    10/22/17 1827  02/11/18 1325  02/13/18 1002 04/22/18 2034 04/22/18 2043 04/23/18 0822 05/01/18  WBC 3.9*   < > 7.5  7.5   < > 10.5 7.1  --  7.9 4.7  NEUTROABS 2.2  --  3.6  --   --  3.8  --   --   --   HGB 16.4*   < > 13.2  13.2   < > 13.4 13.5 13.6 13.1 12.7  HCT 48.4*   < > 41.2  41   < > 41.1 42.4 40.0 39.8 39  MCV 96.0  --  98.3   < > 97.9 96.8  --  93.4  --   PLT 120*   < > PLATELET CLUMPS NOTED ON SMEAR, COUNT APPEARS ADEQUATE   < > 130* 153  --  117* 169   < > = values in this interval not displayed.   Recent Labs    02/12/18 0521  CHOL 152  LDLCALC 99  TRIG 21   No results found for: [facility] Lab Results  Component Value Date   TSH 3.486 04/25/2018   Lab Results  Component Value Date   HGBA1C 4.2 (L) 02/12/2018   Lab Results  Component Value Date    CHOL 152 02/12/2018   HDL 49 02/12/2018   LDLCALC 99 02/12/2018   TRIG 21 02/12/2018   CHOLHDL 3.1 02/12/2018    Significant Diagnostic Results in last 30 days:  No results found.  Assessment and Plan  Dementia Chronic and progressive; continue Exelon 6 mg twice daily and Namenda XR 28 mg once daily  Hypertension Controlled; continue hydralazine 50 mg twice daily and Norvasc 10 mg daily  Hyperlipidemia Continue Pravachol 40 mg daily     Anne D. Lyn Hollingshead, MD

## 2018-06-23 ENCOUNTER — Encounter: Payer: Self-pay | Admitting: Internal Medicine

## 2018-06-23 NOTE — Assessment & Plan Note (Signed)
Controlled; continue hydralazine 50 mg twice daily and Norvasc 10 mg daily 

## 2018-06-23 NOTE — Assessment & Plan Note (Signed)
Continue Pravachol 40 mg daily 

## 2018-06-23 NOTE — Assessment & Plan Note (Signed)
Chronic and progressive; continue Exelon 6 mg twice daily and Namenda XR 28 mg once daily

## 2018-07-16 DIAGNOSIS — R1312 Dysphagia, oropharyngeal phase: Secondary | ICD-10-CM | POA: Diagnosis not present

## 2018-07-17 ENCOUNTER — Encounter: Payer: Self-pay | Admitting: Internal Medicine

## 2018-07-17 ENCOUNTER — Non-Acute Institutional Stay (SKILLED_NURSING_FACILITY): Payer: Medicare Other | Admitting: Internal Medicine

## 2018-07-17 DIAGNOSIS — M810 Age-related osteoporosis without current pathological fracture: Secondary | ICD-10-CM

## 2018-07-17 DIAGNOSIS — E538 Deficiency of other specified B group vitamins: Secondary | ICD-10-CM

## 2018-07-17 DIAGNOSIS — F482 Pseudobulbar affect: Secondary | ICD-10-CM

## 2018-07-17 NOTE — Progress Notes (Signed)
Location:  [facility] Room Number: 203-D Place of Service:  SNF ([id number])  [name], MD  Patient Care Team: [name], MD as PCP - General (Internal Medicine)  Extended Emergency Contact Information Primary Emergency Contact: [name],[name] Address: [address]          [place], [place] [address] [place] Home Phone: [phone] Relation: Daughter Secondary Emergency Contact: [name] Mobile Phone: [phone] Relation: Daughter    Allergies: Penicillins  Chief Complaint  Patient presents with  . Medical Management of Chronic Issues    Routine [facility] visit    HPI: Patient is 77 y.o. female who is being seen for routine issues of B12 deficiency, osteoporosis, and PBA.  Past Medical History:  Diagnosis Date  . Acute encephalopathy   . Altered mental status   . Alzheimer's disease (HCC)   . Chronic constipation   . Dementia (HCC)   . Depressed   . Failure to thrive in adult   . HCAP (healthcare-associated pneumonia)   . Hyperlipidemia   . Hypernatremia   . Hypertension   . Hypokalemia   . Low back pain   . Migraines   . Osteoarthritis   . Osteoporosis   . PBA (pseudobulbar affect)   . Post-ictal state (HCC)   . Seizures (HCC)   . Urinary tract infection with hematuria   . Vitamin B12 deficiency     Past Surgical History:  Procedure Laterality Date  . ABDOMINAL HYSTERECTOMY    . CHOLECYSTECTOMY    . KNEE SURGERY     total    Allergies as of 07/17/2018      Reactions   Penicillins Other (See Comments)   Tolerated Ceftriaxone 04/2018 Other reaction(s): UNKNOWN REACTION      Medication List        Accurate as of 07/17/18  2:12 PM. Always use your most recent med list.          amLODipine 10 MG tablet Commonly known as:  NORVASC Take 10 mg by mouth daily.   aspirin EC 81 MG EC tablet Generic drug:  aspirin Take 81 mg by mouth daily. Swallow whole.   bisacodyl 10 MG  suppository Commonly known as:  DULCOLAX Place 10 mg rectally daily as needed for moderate constipation (if not relieved by MOM).   bisacodyl 10 MG/30ML Enem Commonly known as:  FLEET Place 10 mg rectally daily as needed (if not relieved by Bisacodyl suppository).   calcium carbonate 1250 (500 Ca) MG chewable tablet Commonly known as:  OS-CAL Chew 1 tablet by mouth daily.   D3-1000 1000 units tablet Generic drug:  Cholecalciferol Take 1,000 Units by mouth daily.   FISH OIL PO Take by mouth. 1,600 MG/5 ML LIQUID- take 5 ml po daily as supplement   hydrALAZINE 50 MG tablet Commonly known as:  APRESOLINE Take 50 mg by mouth 2 (two) times daily.   levETIRAcetam 100 MG/ML solution Commonly known as:  KEPPRA Take 5 mLs (500 mg total) by mouth 2 (two) times daily.   MILK OF MAGNESIA PO Take 30 mLs by mouth daily as needed (if no BM in 3 days).   NAMENDA XR 28 MG Cp24 24 hr capsule Generic drug:  memantine Take 28 mg by mouth at bedtime.   NUEDEXTA 20-10 MG Caps Generic drug:  Dextromethorphan-quiNIDine Take 1 capsule by mouth daily.   NUTRITIONAL SUPPLEMENTS PO Initiate 120 ml Medpass 3 times daily with meals related  to weight loss   polyethylene glycol packet Commonly known as:  MIRALAX / GLYCOLAX Take 17 g by mouth daily.   pravastatin 40 MG tablet Commonly known as:  PRAVACHOL Take 40 mg by mouth daily.   rivastigmine 6 MG capsule Commonly known as:  EXELON Take 6 mg by mouth 2 (two) times daily.   vitamin B-12 1000 MCG tablet Commonly known as:  CYANOCOBALAMIN Take 1,000 mcg by mouth daily.       No orders of the defined types were placed in this encounter.   Immunization History  Administered Date(s) Administered  . Influenza, High Dose Seasonal PF 08/01/2016  . Influenza-Unspecified 07/31/2017, 07/05/2018  . PPD Test 12/09/2017  . Pneumococcal Conjugate-13 08/01/2016  . Tdap 12/16/2014    Social History   Tobacco Use  . Smoking status: Former  Games developer  . Smokeless tobacco: Never Used  Substance Use Topics  . Alcohol use: No    Review of Systems  DATA OBTAINED: from patient, nurse, medical record, family member GENERAL:  no fevers, fatigue, appetite changes SKIN: No itching, rash HEENT: No complaint RESPIRATORY: No cough, wheezing, SOB CARDIAC: No chest pain, palpitations, lower extremity edema  GI: No abdominal pain, No N/V/D or constipation, No heartburn or reflux  GU: No dysuria, frequency or urgency, or incontinence  MUSCULOSKELETAL: No unrelieved bone/joint pain NEUROLOGIC: No headache, dizziness  PSYCHIATRIC: No overt anxiety or sadness  Vitals:   07/17/18 1359  BP: 121/70  Pulse: 77  Resp: 20  Temp: (!) 97.4 F (36.3 C)   Body mass index is 26.86 kg/m. Physical Exam  GENERAL APPEARANCE: Alert, conversant, No acute distress  SKIN: No diaphoresis rash HEENT: Unremarkable RESPIRATORY: Breathing is even, unlabored. Lung sounds are clear   CARDIOVASCULAR: Heart RRR no murmurs, rubs or gallops. No peripheral edema  GASTROINTESTINAL: Abdomen is soft, non-tender, not distended w/ normal bowel sounds.  GENITOURINARY: Bladder non tender, not distended  MUSCULOSKELETAL: No abnormal joints or musculature NEUROLOGIC: Cranial nerves 2-12 grossly intact. Moves all extremities PSYCHIATRIC: Mood and affect appropriate to situation, no behavioral issues  Patient Active Problem List   Diagnosis Date Noted  . Enterococcus UTI 04/29/2018  . CAP (community acquired pneumonia) 04/25/2018  . Acute encephalopathy 04/22/2018  . HCAP (healthcare-associated pneumonia) 04/22/2018  . Post-ictal state (HCC) 02/18/2018  . Urinary tract infection with hematuria   . Altered mental status   . Seizure (HCC) 02/11/2018  . Chronic constipation 02/09/2018  . Dementia (HCC) 12/16/2017  . Failure to thrive in adult 12/16/2017  . Hypernatremia 12/16/2017  . Hypokalemia 12/16/2017  . Hypertension 12/16/2017  . PBA (pseudobulbar  affect) 12/16/2017  . Hyperlipidemia 12/16/2017  . Osteoporosis 12/16/2017  . Vitamin B12 deficiency 12/16/2017  . Depression 12/16/2017    CMP     Component Value Date/Time   NA 145 05/01/2018   K 3.8 05/01/2018   CL 107 04/25/2018 0642   CO2 25 04/25/2018 0642   GLUCOSE 85 04/25/2018 0642   BUN 15 05/01/2018   CREATININE 0.5 05/01/2018   CREATININE 0.58 04/25/2018 0642   CALCIUM 8.9 04/25/2018 0642   PROT 5.8 (L) 04/23/2018 0822   ALBUMIN 2.9 (L) 04/23/2018 0822   AST 23 04/23/2018 0822   ALT 10 04/23/2018 0822   ALKPHOS 73 04/23/2018 0822   BILITOT 1.2 04/23/2018 0822   GFRNONAA >60 04/25/2018 0642   GFRAA >60 04/25/2018 1610   Recent Labs    04/22/18 2034 04/22/18 2043 04/23/18 0822 04/25/18 0642 05/01/18  NA 145 147* 147*  142 145  K 3.2* 3.2* 4.5 3.5 3.8  CL 109 109 114* 107  --   CO2 25  --  27 25  --   GLUCOSE 163* 156* 95 85  --   BUN 13 14 10  <5* 15  CREATININE 0.88 0.80 0.61 0.58 0.5  CALCIUM 9.3  --  8.7* 8.9  --    Recent Labs    02/11/18 1325 04/22/18 2034 04/23/18 0822  AST 23 20 23   ALT 8* 11 10  ALKPHOS 78 77 73  BILITOT 0.9 0.9 1.2  PROT 5.7* 6.7 5.8*  ALBUMIN 3.0* 3.3* 2.9*   Recent Labs    10/22/17 1827  02/11/18 1325  02/13/18 1002 04/22/18 2034 04/22/18 2043 04/23/18 0822 05/01/18  WBC 3.9*   < > 7.5  7.5   < > 10.5 7.1  --  7.9 4.7  NEUTROABS 2.2  --  3.6  --   --  3.8  --   --   --   HGB 16.4*   < > 13.2  13.2   < > 13.4 13.5 13.6 13.1 12.7  HCT 48.4*   < > 41.2  41   < > 41.1 42.4 40.0 39.8 39  MCV 96.0  --  98.3   < > 97.9 96.8  --  93.4  --   PLT 120*   < > PLATELET CLUMPS NOTED ON SMEAR, COUNT APPEARS ADEQUATE   < > 130* 153  --  117* 169   < > = values in this interval not displayed.   Recent Labs    02/12/18 0521  CHOL 152  LDLCALC 99  TRIG 21   No results found for: [facility] Lab Results  Component Value Date   TSH 3.486 04/25/2018   Lab Results  Component Value Date   HGBA1C 4.2 (L) 02/12/2018    Lab Results  Component Value Date   CHOL 152 02/12/2018   HDL 49 02/12/2018   LDLCALC 99 02/12/2018   TRIG 21 02/12/2018   CHOLHDL 3.1 02/12/2018    Significant Diagnostic Results in last 30 days:  No results found.  Assessment and Plan  No problem-specific Assessment & Plan notes found for this encounter.   Labs/tests ordered:    Randon Goldsmith. Lyn Hollingshead, MD    This encounter was created in error - please disregard.

## 2018-07-18 DIAGNOSIS — R1312 Dysphagia, oropharyngeal phase: Secondary | ICD-10-CM | POA: Diagnosis not present

## 2018-07-19 DIAGNOSIS — R55 Syncope and collapse: Secondary | ICD-10-CM | POA: Diagnosis not present

## 2018-07-19 DIAGNOSIS — G301 Alzheimer's disease with late onset: Secondary | ICD-10-CM | POA: Diagnosis not present

## 2018-07-19 DIAGNOSIS — R1312 Dysphagia, oropharyngeal phase: Secondary | ICD-10-CM | POA: Diagnosis not present

## 2018-07-20 DIAGNOSIS — R1312 Dysphagia, oropharyngeal phase: Secondary | ICD-10-CM | POA: Diagnosis not present

## 2018-07-22 DIAGNOSIS — D649 Anemia, unspecified: Secondary | ICD-10-CM | POA: Diagnosis not present

## 2018-07-22 LAB — CBC AND DIFFERENTIAL
HCT: 46 (ref 36–46)
Hemoglobin: 15.4 (ref 12.0–16.0)
NEUTROS ABS: 6
PLATELETS: 120 — AB (ref 150–399)
WBC: 7.2

## 2018-07-23 DIAGNOSIS — N390 Urinary tract infection, site not specified: Secondary | ICD-10-CM | POA: Diagnosis not present

## 2018-07-23 DIAGNOSIS — D649 Anemia, unspecified: Secondary | ICD-10-CM | POA: Diagnosis not present

## 2018-07-23 DIAGNOSIS — Z79899 Other long term (current) drug therapy: Secondary | ICD-10-CM | POA: Diagnosis not present

## 2018-07-23 DIAGNOSIS — E039 Hypothyroidism, unspecified: Secondary | ICD-10-CM | POA: Diagnosis not present

## 2018-07-23 DIAGNOSIS — R1312 Dysphagia, oropharyngeal phase: Secondary | ICD-10-CM | POA: Diagnosis not present

## 2018-07-23 DIAGNOSIS — I10 Essential (primary) hypertension: Secondary | ICD-10-CM | POA: Diagnosis not present

## 2018-07-23 LAB — TSH: TSH: 1.25 (ref 0.41–5.90)

## 2018-07-24 DIAGNOSIS — R1312 Dysphagia, oropharyngeal phase: Secondary | ICD-10-CM | POA: Diagnosis not present

## 2018-07-24 DIAGNOSIS — R569 Unspecified convulsions: Secondary | ICD-10-CM | POA: Diagnosis not present

## 2018-07-24 NOTE — Progress Notes (Signed)
Cardiology Office Note   Date:  07/26/2018   ID:  Melissa Jarvis, DOB 12/17/1940, MRN [id number]  PCP:  [name], MD  Cardiologist:   No primary care provider on file. Referring:  [name], MD  Chief Complaint  Patient presents with  . Loss of Consciousness      History of Present Illness: Melissa Jarvis is a 77 y.o. female who is referred by [name], MD for evaluation of syncope and bradycardia.  The patient has a history of syncope.  This is thought to be related to seizures.  Patient has severe dementia and lives in a nursing home.  Her daughter is with her today and can report that she is known to be seizures since 1014.  She did have some cardiac work-up at that time but I do not have those records.  There was apparently a negative treadmill test but she is not otherwise had any cardiac history.  She is been treated with Keppra.  I reviewed extensive notes.  Dating back to her 3 years I see notes outlining falls.  She was admitted in April with a decreased level of consciousness.  Her clinical picture was consistent with a seizure in a postictal state.  She was seen by neurology at [facility] at that time and it was suggested that she continue Keppra.  Her last admission to [facility] was in July.  She was confused and again seen by neurology.  Again Keppra was continued.  However, her confusion was thought to be due to pneumonia and UTI.  I do see that the last EEG in July 2019 demonstrated diffuse slowing but no evidence of seizures.  MRI of the brain at that time demonstrated advanced chronic small vessel ischemic changes but no acute abnormalities.  Recently see saw neurologist in another health system.  I was able to review these records in Care Everywhere.  He noted that she has had several of her "spells" while taking Keppra.  EEG again did not demonstrate epileptiform activity.  She is noted to be bradycardic.  He was wondering whether these could be cardiac  in nature.   The patient is unable to give me any history because of her dementia.  She is accompanied by her daughter who provides the entire history.  The daughter says she thinks she is having about 1 of these a month.  She is witnessed them in the last when she witnessed was when her mom was living with her in February.  Since then she is been in the nursing home.  She gets around in a wheelchair.  Her daughter says that she will be sitting or standing and her eyes will roll back.  She has never witnessed seizure activity.  She has lost her urine one time but otherwise this is not a consistent finding.  There is been no complaints per the daughter of any chest pain.  I do not see any documentation of any arrhythmias other than some bradycardia which she has not been sustained.  There has been no documentation of hypotension or orthostatic changes.  Past Medical History:  Diagnosis Date  . Acute encephalopathy   . Altered mental status   . Alzheimer's disease (HCC)   . Chronic constipation   . Dementia (HCC)   . Depressed   . Failure to thrive in adult   . HCAP (healthcare-associated pneumonia)   . Hyperlipidemia   . Hypernatremia   . Hypertension   .  Hypokalemia   . Low back pain   . Migraines   . Osteoarthritis   . Osteoporosis   . PBA (pseudobulbar affect)   . Post-ictal state (HCC)   . Seizures (HCC)   . Urinary tract infection with hematuria   . Vitamin B12 deficiency     Past Surgical History:  Procedure Laterality Date  . ABDOMINAL HYSTERECTOMY    . CHOLECYSTECTOMY    . KNEE SURGERY     total     Current Outpatient Medications  Medication Sig Dispense Refill  . amLODipine (NORVASC) 10 MG tablet Take 10 mg by mouth daily.    Marland Kitchen aspirin (ASPIRIN EC) 81 MG EC tablet Take 81 mg by mouth daily. Swallow whole.     . bisacodyl (DULCOLAX) 10 MG suppository Place 10 mg rectally daily as needed for moderate constipation (if not relieved by MOM).     . bisacodyl (FLEET) 10  MG/30ML ENEM Place 10 mg rectally daily as needed (if not relieved by Bisacodyl suppository).     . calcium carbonate (OS-CAL) 1250 (500 Ca) MG chewable tablet Chew 1 tablet by mouth daily.    . Cholecalciferol (D3-1000) 1000 units tablet Take 1,000 Units by mouth daily.    Marland Kitchen Dextromethorphan-quiNIDine (NUEDEXTA) 20-10 MG CAPS Take 1 capsule by mouth daily.     . hydrALAZINE (APRESOLINE) 50 MG tablet Take 50 mg by mouth 2 (two) times daily.    Marland Kitchen levETIRAcetam (KEPPRA) 100 MG/ML solution Take 5 mLs (500 mg total) by mouth 2 (two) times daily. 473 mL 12  . Magnesium Hydroxide (MILK OF MAGNESIA PO) Take 30 mLs by mouth daily as needed (if no BM in 3 days).     . memantine (NAMENDA XR) 28 MG CP24 24 hr capsule Take 28 mg by mouth at bedtime.     Marland Kitchen NUTRITIONAL SUPPLEMENTS PO Initiate 120 ml Medpass 3 times daily with meals related to weight loss    . Omega-3 Fatty Acids (FISH OIL PO) Take by mouth. 1,600 MG/5 ML LIQUID- take 5 ml po daily as supplement    . polyethylene glycol (MIRALAX / GLYCOLAX) packet Take 17 g by mouth daily.    . pravastatin (PRAVACHOL) 40 MG tablet Take 40 mg by mouth daily.    . rivastigmine (EXELON) 6 MG capsule Take 6 mg by mouth 2 (two) times daily.     . vitamin B-12 (CYANOCOBALAMIN) 1000 MCG tablet Take 1,000 mcg by mouth daily.     No current facility-administered medications for this visit.     Allergies:   Penicillins    Social History:  The patient  reports that she has quit smoking. She has never used smokeless tobacco. She reports that she does not drink alcohol or use drugs.   Family History:  The patient's family history includes Alzheimer's disease in her father; Heart attack in her brother; Hypertension in her mother; Peripheral vascular disease in her mother; Stroke in her brother.    ROS: Unable to obtain secondary to patient's severe dementia   PHYSICAL EXAM: VS:  BP (!) 180/80   Pulse 63  , BMI There is no height or weight on file to calculate  BMI. PHYSICAL EXAM GEN:  No distress NECK:  No jugular venous distention at 90 degrees, waveform within normal limits, carotid upstroke brisk and symmetric, no bruits, no thyromegaly LYMPHATICS:  No cervical adenopathy LUNGS:  Clear to auscultation bilaterally BACK:  No CVA tenderness CHEST:  Unremarkable HEART:  S1 and S2 within normal  limits, no S3, no S4, no clicks, no rubs, no murmurs ABD:  Positive bowel sounds normal in frequency in pitch, no bruits, no rebound, no guarding, unable to assess midline mass or bruit with the patient seated. EXT:  2 plus pulses throughout, moderate edema, no cyanosis no clubbing SKIN:  No rashes no nodules NEURO: Nonfocal PSYCH: Unable to assess secondary to dementia   EKG:  EKG is ordered today. The ekg ordered today demonstrates sinus rhythm, rate 63, axis within normal limits, intervals within normal limits, no acute ST-T wave changes.   Recent Labs: 04/23/2018: ALT 10 04/25/2018: TSH 3.486 05/01/2018: BUN 15; Creatinine 0.5; Hemoglobin 12.7; Platelets 169; Potassium 3.8; Sodium 145    Lipid Panel    Component Value Date/Time   CHOL 152 02/12/2018 0521   TRIG 21 02/12/2018 0521   HDL 49 02/12/2018 0521   CHOLHDL 3.1 02/12/2018 0521   VLDL 4 02/12/2018 0521   LDLCALC 99 02/12/2018 0521      Wt Readings from Last 3 Encounters:  07/17/18 166 lb 6.4 oz (75.5 kg)  06/19/18 166 lb (75.3 kg)  05/23/18 168 lb (76.2 kg)      Other studies Reviewed: Additional studies/ records that were reviewed today include: Extensive review of hospital records dating back to 2016, review of neurology records from [facility].   (Greater than 40 minutes reviewing all data with greater than 50% face to face with the patient). Review of the above records demonstrates:  Please see elsewhere in the note.     ASSESSMENT AND PLAN:  SYNCOPE: The etiology is not clear.  She is been treated for seizures but she continues to have the symptoms.  I reviewed the extensive  records and documented as above.  At this point there is no evidence that this is cardiac either.  She does have bradycardia but no other conduction disturbance.  She has no other cardiac history.  At this point I would like to get a monitor and will start with a Zio Patch.  I will do this as I think she might have trouble wearing a monitor with leads.  She might need an implanted loop if events continue and there is no other etiology identified.  At this point I do not suspect any structural heart disease and I am not planning other work-up pending this result.  I have reviewed labs which have included TSH and electrolytes.  HTN:  The blood pressure is at target. No change in medications is indicated. We will continue with therapeutic lifestyle changes (TLC).   Current medicines are reviewed at length with the patient today.  The patient does not have concerns regarding medicines.  The following changes have been made:  None  Labs/ tests ordered today include:   Orders Placed This Encounter  Procedures  . LONG TERM MONITOR (3-14 DAYS)  . EKG 12-Lead     Disposition:   FU with me as needed based on the results and future symptoms.     Signed, Rollene Rotunda, MD  07/26/2018 12:50 PM    [facility]

## 2018-07-26 ENCOUNTER — Ambulatory Visit (INDEPENDENT_AMBULATORY_CARE_PROVIDER_SITE_OTHER): Payer: Medicare Other | Admitting: Cardiology

## 2018-07-26 ENCOUNTER — Encounter: Payer: Self-pay | Admitting: Cardiology

## 2018-07-26 VITALS — BP 180/80 | HR 63

## 2018-07-26 DIAGNOSIS — R55 Syncope and collapse: Secondary | ICD-10-CM | POA: Diagnosis not present

## 2018-07-26 NOTE — Patient Instructions (Signed)
Medication Instructions:  Continue current medications  If you need a refill on your cardiac medications before your next appointment, please call your pharmacy.  Labwork: None Ordered   If you have labs (blood work) drawn today and your tests are completely normal, you will receive your results only by: Marland Kitchen MyChart Message (if you have MyChart) OR . A paper copy in the mail If you have any lab test that is abnormal or we need to change your treatment, we will call you to review the results.  Testing/Procedures: Your physician has recommended that you wear an Zio monitor for 14 days. Event monitors are medical devices that record the heart's electrical activity. Doctors most often [place] these monitors to diagnose arrhythmias. Arrhythmias are problems with the speed or rhythm of the heartbeat. The monitor is a small, portable device. You can wear one while you do your normal daily activities. This is usually used to diagnose what is causing palpitations/syncope (passing out).   Follow-Up: . You will need a follow up appointment in As Needed.    At [facility], you and your health needs are our priority.  As part of our continuing mission to provide you with exceptional heart care, we have created designated Provider Care Teams.  These Care Teams include your primary Cardiologist (physician) and Advanced Practice Providers (APPs -  Physician Assistants and Nurse Practitioners) who all work together to provide you with the care you need, when you need it.   Thank you for choosing [facility] at [facility]!!

## 2018-07-27 DIAGNOSIS — I10 Essential (primary) hypertension: Secondary | ICD-10-CM | POA: Diagnosis not present

## 2018-07-27 DIAGNOSIS — D649 Anemia, unspecified: Secondary | ICD-10-CM | POA: Diagnosis not present

## 2018-07-27 LAB — BASIC METABOLIC PANEL
BUN: 11 (ref 4–21)
Creatinine: 0.4 — AB (ref 0.5–1.1)
GLUCOSE: 88
Potassium: 3.8 (ref 3.4–5.3)
SODIUM: 146 (ref 137–147)

## 2018-08-07 ENCOUNTER — Ambulatory Visit: Payer: Medicare Other

## 2018-08-07 DIAGNOSIS — R55 Syncope and collapse: Secondary | ICD-10-CM

## 2018-08-15 ENCOUNTER — Encounter: Payer: Self-pay | Admitting: Internal Medicine

## 2018-08-15 NOTE — Assessment & Plan Note (Signed)
Stable; continue replacement 1000 mcg daily

## 2018-08-15 NOTE — Assessment & Plan Note (Signed)
Has been controlled; continue Nuedexta 10-20 1 p.o. daily 

## 2018-08-15 NOTE — Assessment & Plan Note (Signed)
Has been controlled; continue Nuedexta 10-20 1 p.o. daily

## 2018-08-15 NOTE — Progress Notes (Signed)
Location:  [facility]   Place of Service:  SNF (31)SNF  [name], MD  Patient Care Team: [name], MD as PCP - General (Internal Medicine)  Extended Emergency Contact Information Primary Emergency Contact: [name],[name] Address: [address]          [place], [place] [address] [place] Home Phone: [phone] Relation: Daughter Secondary Emergency Contact: [name] Mobile Phone: [phone] Relation: Daughter    Allergies: Penicillins  Chief Complaint  Patient presents with  . Medical Management of Chronic Issues    HPI: Patient is 77 y.o. female who is being seen for routine issues of vitamin B12 deficiency, osteoporosis, and PBA.  Past Medical History:  Diagnosis Date  . Acute encephalopathy   . Altered mental status   . Alzheimer's disease (HCC)   . Chronic constipation   . Dementia (HCC)   . Depressed   . Failure to thrive in adult   . HCAP (healthcare-associated pneumonia)   . Hyperlipidemia   . Hypernatremia   . Hypertension   . Hypokalemia   . Low back pain   . Migraines   . Osteoarthritis   . Osteoporosis   . PBA (pseudobulbar affect)   . Post-ictal state (HCC)   . Seizures (HCC)   . Urinary tract infection with hematuria   . Vitamin B12 deficiency     Past Surgical History:  Procedure Laterality Date  . ABDOMINAL HYSTERECTOMY    . CHOLECYSTECTOMY    . KNEE SURGERY     total    Allergies as of 07/17/2018      Reactions   Penicillins Other (See Comments)   Tolerated Ceftriaxone 04/2018 Other reaction(s): UNKNOWN REACTION      Medication List        Accurate as of 07/17/18 11:59 PM. Always use your most recent med list.          amLODipine 10 MG tablet Commonly known as:  NORVASC Take 10 mg by mouth daily.   aspirin EC 81 MG EC tablet Generic drug:  aspirin Take 81 mg by mouth daily. Swallow whole.   bisacodyl 10 MG suppository Commonly known as:   DULCOLAX Place 10 mg rectally daily as needed for moderate constipation (if not relieved by MOM).   bisacodyl 10 MG/30ML Enem Commonly known as:  FLEET Place 10 mg rectally daily as needed (if not relieved by Bisacodyl suppository).   calcium carbonate 1250 (500 Ca) MG chewable tablet Commonly known as:  OS-CAL Chew 1 tablet by mouth daily.   D3-1000 1000 units tablet Generic drug:  Cholecalciferol Take 1,000 Units by mouth daily.   FISH OIL PO Take by mouth. 1,600 MG/5 ML LIQUID- take 5 ml po daily as supplement   hydrALAZINE 50 MG tablet Commonly known as:  APRESOLINE Take 50 mg by mouth 2 (two) times daily.   levETIRAcetam 100 MG/ML solution Commonly known as:  KEPPRA Take 5 mLs (500 mg total) by mouth 2 (two) times daily.   MILK OF MAGNESIA PO Take 30 mLs by mouth daily as needed (if no BM in 3 days).   NAMENDA XR 28 MG Cp24 24 hr capsule Generic drug:  memantine Take 28 mg by mouth at bedtime.   NUEDEXTA 20-10 MG Caps Generic drug:  Dextromethorphan-quiNIDine Take 1 capsule by mouth daily.   NUTRITIONAL SUPPLEMENTS PO Initiate 120 ml Medpass 3 times daily with meals related to weight loss   polyethylene glycol packet Commonly known  as:  MIRALAX / GLYCOLAX Take 17 g by mouth daily.   pravastatin 40 MG tablet Commonly known as:  PRAVACHOL Take 40 mg by mouth daily.   rivastigmine 6 MG capsule Commonly known as:  EXELON Take 6 mg by mouth 2 (two) times daily.   vitamin B-12 1000 MCG tablet Commonly known as:  CYANOCOBALAMIN Take 1,000 mcg by mouth daily.       No orders of the defined types were placed in this encounter.   Immunization History  Administered Date(s) Administered  . Influenza, High Dose Seasonal PF 08/01/2016  . Influenza-Unspecified 07/31/2017, 07/05/2018  . PPD Test 12/09/2017  . Pneumococcal Conjugate-13 08/01/2016  . Tdap 12/16/2014    Social History   Tobacco Use  . Smoking status: Former Games developer  . Smokeless tobacco:  Never Used  Substance Use Topics  . Alcohol use: No    Review of Systems  DATA OBTAINED: from patient-limited; nursing no acute concerns GENERAL:  no fevers, fatigue, appetite changes SKIN: No itching, rash HEENT: No complaint RESPIRATORY: No cough, wheezing, SOB CARDIAC: No chest pain, palpitations, lower extremity edema  GI: No abdominal pain, No N/V/D or constipation, No heartburn or reflux  GU: No dysuria, frequency or urgency, or incontinence  MUSCULOSKELETAL: No unrelieved bone/joint pain NEUROLOGIC: No headache, dizziness  PSYCHIATRIC: No overt anxiety or sadness  Vitals:   08/15/18 1022  BP: 132/66  Pulse: 74  Resp: 18  Temp: 98.2 F (36.8 C)   There is no height or weight on file to calculate BMI. Physical Exam  GENERAL APPEARANCE: Alert, conversant, No acute distress  SKIN: No diaphoresis rash HEENT: Unremarkable RESPIRATORY: Breathing is even, unlabored. Lung sounds are clear   CARDIOVASCULAR: Heart RRR no murmurs, rubs or gallops. No peripheral edema  GASTROINTESTINAL: Abdomen is soft, non-tender, not distended w/ normal bowel sounds.  GENITOURINARY: Bladder non tender, not distended  MUSCULOSKELETAL: No abnormal joints or musculature NEUROLOGIC: Cranial nerves 2-12 grossly intact. Moves all extremities PSYCHIATRIC: Mood and affect with dementia, no behavioral issues  Patient Active Problem List   Diagnosis Date Noted  . Enterococcus UTI 04/29/2018  . CAP (community acquired pneumonia) 04/25/2018  . Acute encephalopathy 04/22/2018  . HCAP (healthcare-associated pneumonia) 04/22/2018  . Post-ictal state (HCC) 02/18/2018  . Urinary tract infection with hematuria   . Altered mental status   . Seizure (HCC) 02/11/2018  . Chronic constipation 02/09/2018  . Dementia (HCC) 12/16/2017  . Failure to thrive in adult 12/16/2017  . Hypernatremia 12/16/2017  . Hypokalemia 12/16/2017  . Hypertension 12/16/2017  . 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  Hyperlipidemia 12/16/2017  . Osteoporosis 12/16/2017  . Vitamin B12 deficiency 12/16/2017  . Depression 12/16/2017    CMP     Component Value Date/Time   NA 146 07/27/2018   K 3.8 07/27/2018   CL 107 04/25/2018 0642   CO2 25 04/25/2018 0642   GLUCOSE 85 04/25/2018 0642   BUN 11 07/27/2018   CREATININE 0.4 (A) 07/27/2018   CREATININE 0.58 04/25/2018 0642   CALCIUM 8.9 04/25/2018 0642   PROT 5.8 (L) 04/23/2018 0822   ALBUMIN 2.9 (L) 04/23/2018 0822   AST 23 04/23/2018 0822   ALT 10 04/23/2018 0822   ALKPHOS 73 04/23/2018 0822   BILITOT 1.2 04/23/2018 0822   GFRNONAA >60 04/25/2018 0642   GFRAA >60 04/25/2018 0642   Recent Labs    04/22/18 2034 04/22/18 2043 04/23/18 0822 04/25/18 0642 05/01/18 07/27/18  NA 145 147* 147* 142 145 146  K 3.2*  3.2* 4.5 3.5 3.8 3.8  CL 109 109 114* 107  --   --   CO2 25  --  27 25  --   --   GLUCOSE 163* 156* 95 85  --   --   BUN 13 14 10  <5* 15 11  CREATININE 0.88 0.80 0.61 0.58 0.5 0.4*  CALCIUM 9.3  --  8.7* 8.9  --   --    Recent Labs    02/11/18 1325 04/22/18 2034 04/23/18 0822  AST 23 20 23   ALT 8* 11 10  ALKPHOS 78 77 73  BILITOT 0.9 0.9 1.2  PROT 5.7* 6.7 5.8*  ALBUMIN 3.0* 3.3* 2.9*   Recent Labs    02/11/18 1325  02/13/18 1002 04/22/18 2034  04/23/18 0822 05/01/18 07/22/18  WBC 7.5  7.5   < > 10.5 7.1  --  7.9 4.7 7.2  NEUTROABS 3.6  --   --  3.8  --   --   --  6  HGB 13.2  13.2   < > 13.4 13.5   < > 13.1 12.7 15.4  HCT 41.2  41   < > 41.1 42.4   < > 39.8 39 46  MCV 98.3   < > 97.9 96.8  --  93.4  --   --   PLT PLATELET CLUMPS NOTED ON SMEAR, COUNT APPEARS ADEQUATE   < > 130* 153  --  117* 169 120*   < > = values in this interval not displayed.   Recent Labs    02/12/18 0521  CHOL 152  LDLCALC 99  TRIG 21   No results found for: [facility] Lab Results  Component Value Date   TSH 1.25 07/23/2018   Lab Results  Component Value Date   HGBA1C 4.2 (L) 02/12/2018   Lab Results  Component Value Date    CHOL 152 02/12/2018   HDL 49 02/12/2018   LDLCALC 99 02/12/2018   TRIG 21 02/12/2018   CHOLHDL 3.1 02/12/2018    Significant Diagnostic Results in last 30 days:  No results found.  Assessment and Plan  PBA (pseudobulbar affect) Has been controlled; continue Nuedexta 10-20 1 p.o. daily  Osteoporosis Stable, no fractures; continue Os-Cal 1250 mg chewables 1 daily and vitamin D 1000 units daily  Vitamin B12 deficiency Stable; continue replacement 1000 mcg daily    Merrilee Seashore, MD

## 2018-08-15 NOTE — Assessment & Plan Note (Signed)
Stable; continue replacement 1000 mcg daily 

## 2018-08-15 NOTE — Assessment & Plan Note (Signed)
Stable; no fractures; continue Os-Cal 1250 mg chewables 1 daily and vitamin D 1000 units daily

## 2018-08-15 NOTE — Assessment & Plan Note (Signed)
Stable, no fractures; continue Os-Cal 1250 mg chewables 1 daily and vitamin D 1000 units daily

## 2018-08-20 ENCOUNTER — Encounter: Payer: Self-pay | Admitting: Internal Medicine

## 2018-08-20 ENCOUNTER — Non-Acute Institutional Stay (SKILLED_NURSING_FACILITY): Payer: Medicare Other | Admitting: Internal Medicine

## 2018-08-20 DIAGNOSIS — R569 Unspecified convulsions: Secondary | ICD-10-CM

## 2018-08-20 DIAGNOSIS — I10 Essential (primary) hypertension: Secondary | ICD-10-CM

## 2018-08-20 DIAGNOSIS — K5909 Other constipation: Secondary | ICD-10-CM

## 2018-08-20 NOTE — Progress Notes (Signed)
Location:  [facility] Room Number: 203D Place of Service:  SNF [phone])  Melissa Jarvis. Lyn Hollingshead, MD  Patient Care Team: [name], MD as PCP - General (Internal Medicine)  Extended Emergency Contact Information Primary Emergency Contact: [name],[name] Address: [address]          [place], [place] [address] [place] Home Phone: [phone] Relation: Daughter Secondary Emergency Contact: [name] Mobile Phone: [phone] Relation: Daughter    Allergies: Penicillins  Chief Complaint  Patient presents with  . Medical Management of Chronic Issues    Routine Visit    HPI: Patient is 77 y.o. female who is here for routine issues of seizures, constipation, and hypertension.  Past Medical History:  Diagnosis Date  . Acute encephalopathy   . Altered mental status   . Alzheimer's disease (HCC)   . Chronic constipation   . Dementia (HCC)   . Depressed   . Failure to thrive in adult   . HCAP (healthcare-associated pneumonia)   . Hyperlipidemia   . Hypernatremia   . Hypertension   . Hypokalemia   . Low back pain   . Migraines   . Osteoarthritis   . Osteoporosis   . PBA (pseudobulbar affect)   . Post-ictal state (HCC)   . Seizures (HCC)   . Urinary tract infection with hematuria   . Vitamin B12 deficiency     Past Surgical History:  Procedure Laterality Date  . ABDOMINAL HYSTERECTOMY    . CHOLECYSTECTOMY    . KNEE SURGERY     total    Allergies as of 08/20/2018      Reactions   Penicillins Other (See Comments)   Tolerated Ceftriaxone 04/2018 Other reaction(s): UNKNOWN REACTION      Medication List        Accurate as of 08/20/18 11:59 PM. Always use your most recent med list.          acetaminophen 325 MG tablet Commonly known as:  TYLENOL Take 650 mg by mouth every 6 (six) hours as needed.   amLODipine 10 MG tablet Commonly known as:  NORVASC Take 10 mg by mouth daily.   aspirin EC 81 MG  EC tablet Generic drug:  aspirin Take 81 mg by mouth daily. Swallow whole.   bisacodyl 10 MG suppository Commonly known as:  DULCOLAX Place 10 mg rectally daily as needed for moderate constipation (if not relieved by MOM).   bisacodyl 10 MG/30ML Enem Commonly known as:  FLEET Place 10 mg rectally daily as needed (if not relieved by Bisacodyl suppository).   calcium carbonate 1250 (500 Ca) MG chewable tablet Commonly known as:  OS-CAL Chew 1 tablet by mouth daily.   D3-1000 1000 units tablet Generic drug:  Cholecalciferol Take 1,000 Units by mouth daily.   FISH OIL PO Take by mouth. 1,600 MG/5 ML LIQUID- take 5 ml po daily as supplement   hydrALAZINE 50 MG tablet Commonly known as:  APRESOLINE Take 50 mg by mouth 2 (two) times daily.   levETIRAcetam 100 MG/ML solution Commonly known as:  KEPPRA Take 5 mLs (500 mg total) by mouth 2 (two) times daily.   MILK OF MAGNESIA PO Take 30 mLs by mouth daily as needed (if no BM in 3 days).   NAMENDA XR 28 MG Cp24 24 hr capsule Generic drug:  memantine Take 28 mg by mouth at bedtime.   NUEDEXTA 20-10 MG Caps Generic drug:  Dextromethorphan-quiNIDine Take 1 capsule by  mouth daily.   NUTRITIONAL SUPPLEMENTS PO Initiate 120 ml Medpass 3 times daily with meals related to weight loss   polyethylene glycol packet Commonly known as:  MIRALAX / GLYCOLAX Take 17 g by mouth daily.   pravastatin 40 MG tablet Commonly known as:  PRAVACHOL Take 40 mg by mouth daily.   rivastigmine 6 MG capsule Commonly known as:  EXELON Take 6 mg by mouth 2 (two) times daily.   vitamin B-12 1000 MCG tablet Commonly known as:  CYANOCOBALAMIN Take 1,000 mcg by mouth daily.       No orders of the defined types were placed in this encounter.   Immunization History  Administered Date(s) Administered  . Influenza, High Dose Seasonal PF 08/01/2016  . Influenza-Unspecified 07/31/2017, 07/05/2018  . PPD Test 12/09/2017  . Pneumococcal  Conjugate-13 08/01/2016  . Tdap 12/16/2014    Social History   Tobacco Use  . Smoking status: Former Games developer  . Smokeless tobacco: Never Used  Substance Use Topics  . Alcohol use: No    Review of Systems  DATA OBTAINED: from patient limited; nursing-no acute concerns GENERAL:  no fevers, fatigue, appetite changes SKIN: No itching, rash HEENT: No complaint RESPIRATORY: No cough, wheezing, SOB CARDIAC: No chest pain, palpitations, lower extremity edema  GI: No abdominal pain, No N/V/D or constipation, No heartburn or reflux  GU: No dysuria, frequency or urgency, or incontinence  MUSCULOSKELETAL: No unrelieved bone/joint pain NEUROLOGIC: No headache, dizziness  PSYCHIATRIC: No overt anxiety or sadness  Vitals:   08/20/18 1520  BP: 129/78  Pulse: 80  Resp: 18  Temp: 98.6 F (37 C)   Body mass index is 26.79 kg/m. Physical Exam  GENERAL APPEARANCE: Alert, conversant, No acute distress  SKIN: No diaphoresis rash HEENT: Unremarkable RESPIRATORY: Breathing is even, unlabored. Lung sounds are clear   CARDIOVASCULAR: Heart RRR no murmurs, rubs or gallops. No peripheral edema  GASTROINTESTINAL: Abdomen is soft, non-tender, not distended w/ normal bowel sounds.  GENITOURINARY: Bladder non tender, not distended  MUSCULOSKELETAL: No abnormal joints or musculature NEUROLOGIC: Cranial nerves 2-12 grossly intact. Moves all extremities PSYCHIATRIC: Mood and affect dementia, no behavioral issues  Patient Active Problem List   Diagnosis Date Noted  . Enterococcus UTI 04/29/2018  . CAP (community acquired pneumonia) 04/25/2018  . Acute encephalopathy 04/22/2018  . HCAP (healthcare-associated pneumonia) 04/22/2018  . Post-ictal state (HCC) 02/18/2018  . Urinary tract infection with hematuria   . Altered mental status   . Seizure (HCC) 02/11/2018  . Chronic constipation 02/09/2018  . Dementia (HCC) 12/16/2017  . Failure to thrive in adult 12/16/2017  . Hypernatremia 12/16/2017    . Hypokalemia 12/16/2017  . Hypertension 12/16/2017  . 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 Hyperlipidemia 12/16/2017  . Osteoporosis 12/16/2017  . Vitamin B12 deficiency 12/16/2017  . Depression 12/16/2017    CMP     Component Value Date/Time   NA 146 07/27/2018   K 3.8 07/27/2018   CL 107 04/25/2018 0642   CO2 25 04/25/2018 0642   GLUCOSE 85 04/25/2018 0642   BUN 11 07/27/2018   CREATININE 0.4 (A) 07/27/2018   CREATININE 0.58 04/25/2018 0642   CALCIUM 8.9 04/25/2018 0642   PROT 5.8 (L) 04/23/2018 0822   ALBUMIN 2.9 (L) 04/23/2018 0822   AST 23 04/23/2018 0822   ALT 10 04/23/2018 0822   ALKPHOS 73 04/23/2018 0822   BILITOT 1.2 04/23/2018 0822   GFRNONAA >60 04/25/2018 0642   GFRAA >60 04/25/2018 0642   Recent Labs  04/22/18 2034 04/22/18 2043 04/23/18 0822 04/25/18 0642 05/01/18 07/27/18  NA 145 147* 147* 142 145 146  K 3.2* 3.2* 4.5 3.5 3.8 3.8  CL 109 109 114* 107  --   --   CO2 25  --  27 25  --   --   GLUCOSE 163* 156* 95 85  --   --   BUN 13 14 10  <5* 15 11  CREATININE 0.88 0.80 0.61 0.58 0.5 0.4*  CALCIUM 9.3  --  8.7* 8.9  --   --    Recent Labs    02/11/18 1325 04/22/18 2034 04/23/18 0822  AST 23 20 23   ALT 8* 11 10  ALKPHOS 78 77 73  BILITOT 0.9 0.9 1.2  PROT 5.7* 6.7 5.8*  ALBUMIN 3.0* 3.3* 2.9*   Recent Labs    02/11/18 1325  02/13/18 1002 04/22/18 2034  04/23/18 0822 05/01/18 07/22/18  WBC 7.5  7.5   < > 10.5 7.1  --  7.9 4.7 7.2  NEUTROABS 3.6  --   --  3.8  --   --   --  6  HGB 13.2  13.2   < > 13.4 13.5   < > 13.1 12.7 15.4  HCT 41.2  41   < > 41.1 42.4   < > 39.8 39 46  MCV 98.3   < > 97.9 96.8  --  93.4  --   --   PLT PLATELET CLUMPS NOTED ON SMEAR, COUNT APPEARS ADEQUATE   < > 130* 153  --  117* 169 120*   < > = values in this interval not displayed.   Recent Labs    02/12/18 0521  CHOL 152  LDLCALC 99  TRIG 21   No results found for: [facility] Lab Results  Component Value Date   TSH 1.25 07/23/2018   Lab  Results  Component Value Date   HGBA1C 4.2 (L) 02/12/2018   Lab Results  Component Value Date   CHOL 152 02/12/2018   HDL 49 02/12/2018   LDLCALC 99 02/12/2018   TRIG 21 02/12/2018   CHOLHDL 3.1 02/12/2018    Significant Diagnostic Results in last 30 days:  No results found.  Assessment and Plan  Seizure [facility]) Patient continues to have breakthrough seizures but her Keppra dose remains the same 500 mg twice daily; continue to monitor  Chronic constipation ; Continue MiraLAX 17 g daily with PRN's including please enema, Dulcolax suppositories and milk of magnesia  Hypertension Controlled; continue Norvasc 10 mg daily and hydralazine 50 mg twice daily    Melissa Jarvis D. Lyn Hollingshead, MD

## 2018-08-21 ENCOUNTER — Encounter: Payer: Self-pay | Admitting: Internal Medicine

## 2018-08-21 NOTE — Assessment & Plan Note (Signed)
;   Continue MiraLAX 17 g daily with PRN's including please enema, Dulcolax suppositories and milk of magnesia

## 2018-08-21 NOTE — Assessment & Plan Note (Signed)
Patient continues to have breakthrough seizures but her Keppra dose remains the same 500 mg twice daily; continue to monitor

## 2018-08-21 NOTE — Assessment & Plan Note (Signed)
Controlled; continue Norvasc 10 mg daily and hydralazine 50 mg twice daily 

## 2018-09-18 ENCOUNTER — Non-Acute Institutional Stay (SKILLED_NURSING_FACILITY): Payer: Medicare Other | Admitting: Internal Medicine

## 2018-09-18 ENCOUNTER — Encounter: Payer: Self-pay | Admitting: Internal Medicine

## 2018-09-18 DIAGNOSIS — E538 Deficiency of other specified B group vitamins: Secondary | ICD-10-CM | POA: Diagnosis not present

## 2018-09-18 DIAGNOSIS — R627 Adult failure to thrive: Secondary | ICD-10-CM | POA: Diagnosis not present

## 2018-09-18 DIAGNOSIS — F0280 Dementia in other diseases classified elsewhere without behavioral disturbance: Secondary | ICD-10-CM

## 2018-09-18 DIAGNOSIS — G300 Alzheimer's disease with early onset: Secondary | ICD-10-CM | POA: Diagnosis not present

## 2018-09-18 NOTE — Progress Notes (Signed)
Location:  [facility] Room Number: 203D Place of Service:  SNF [phone])  Randon Goldsmith. Lyn Hollingshead, MD  Patient Care Team: [name], MD as PCP - General (Internal Medicine)  Extended Emergency Contact Information Primary Emergency Contact: [name],[name] Address: [address]          [place], [place] [address] [place] Home Phone: [phone] Relation: Daughter Secondary Emergency Contact: [name] Mobile Phone: [phone] Relation: Daughter    Allergies: Penicillins  Chief Complaint  Patient presents with  . Medical Management of Chronic Issues    Routine Visit    HPI: Patient is 77 y.o. female who is being seen for routine issues of dementia, failure to thrive, and vitamin D deficiency.  Past Medical History:  Diagnosis Date  . Acute encephalopathy   . Altered mental status   . Alzheimer's disease (HCC)   . Chronic constipation   . Dementia (HCC)   . Depressed   . Failure to thrive in adult   . HCAP (healthcare-associated pneumonia)   . Hyperlipidemia   . Hypernatremia   . Hypertension   . Hypokalemia   . Low back pain   . Migraines   . Osteoarthritis   . Osteoporosis   . PBA (pseudobulbar affect)   . Post-ictal state (HCC)   . Seizures (HCC)   . Urinary tract infection with hematuria   . Vitamin B12 deficiency     Past Surgical History:  Procedure Laterality Date  . ABDOMINAL HYSTERECTOMY    . CHOLECYSTECTOMY    . KNEE SURGERY     total    Allergies as of 09/18/2018      Reactions   Penicillins Other (See Comments)   Tolerated Ceftriaxone 04/2018 Other reaction(s): UNKNOWN REACTION      Medication List       Accurate as of September 18, 2018 11:59 PM. Always use your most recent med list.        acetaminophen 325 MG tablet Commonly known as:  TYLENOL Take 650 mg by mouth every 6 (six) hours as needed.   amLODipine 10 MG tablet Commonly known as:  NORVASC Take 10 mg by mouth  daily.   aspirin EC 81 MG EC tablet Generic drug:  aspirin Take 81 mg by mouth daily. Swallow whole.   bisacodyl 10 MG suppository Commonly known as:  DULCOLAX Place 10 mg rectally daily as needed for moderate constipation (if not relieved by MOM).   bisacodyl 10 MG/30ML Enem Commonly known as:  FLEET Place 10 mg rectally daily as needed (if not relieved by Bisacodyl suppository).   calcium carbonate 1250 (500 Ca) MG chewable tablet Commonly known as:  OS-CAL Chew 1 tablet by mouth daily.   D3-1000 25 MCG (1000 UT) tablet Generic drug:  Cholecalciferol Take 1,000 Units by mouth daily.   FISH OIL PO Take by mouth. 1,600 MG/5 ML LIQUID- take 5 ml po daily as supplement   hydrALAZINE 50 MG tablet Commonly known as:  APRESOLINE Take 50 mg by mouth 2 (two) times daily.   levETIRAcetam 100 MG/ML solution Commonly known as:  KEPPRA Take 5 mLs (500 mg total) by mouth 2 (two) times daily.   MILK OF MAGNESIA PO Take 30 mLs by mouth daily as needed (if no BM in 3 days).   NAMENDA XR 28 MG Cp24 24 hr capsule Generic drug:  memantine Take 28 mg by mouth at bedtime.   NUEDEXTA 20-10 MG Caps Generic drug:  Dextromethorphan-quiNIDine Take 1 capsule by mouth daily.   NUTRITIONAL SUPPLEMENTS PO Initiate 120 ml Medpass 3 times daily with meals related to weight loss   polyethylene glycol packet Commonly known as:  MIRALAX / GLYCOLAX Take 17 g by mouth daily.   pravastatin 40 MG tablet Commonly known as:  PRAVACHOL Take 40 mg by mouth daily.   rivastigmine 6 MG capsule Commonly known as:  EXELON Take 6 mg by mouth 2 (two) times daily.   vitamin B-12 1000 MCG tablet Commonly known as:  CYANOCOBALAMIN Take 1,000 mcg by mouth daily.       No orders of the defined types were placed in this encounter.   Immunization History  Administered Date(s) Administered  . Influenza, High Dose Seasonal PF 08/01/2016  . Influenza-Unspecified 07/31/2017, 07/05/2018  . PPD Test  12/09/2017  . Pneumococcal Conjugate-13 08/01/2016  . Tdap 12/16/2014    Social History   Tobacco Use  . Smoking status: Former Games developermoker  . Smokeless tobacco: Never Used  Substance Use Topics  . Alcohol use: No    Review of Systems  DATA OBTAINED: from patient limited; nursing- only concern is appetite GENERAL:  no fevers, fatigue, appetite changes SKIN: No itching, rash HEENT: No complaint RESPIRATORY: No cough, wheezing, SOB CARDIAC: No chest pain, palpitations, lower extremity edema  GI: No abdominal pain, No N/V/D or constipation, No heartburn or reflux  GU: No dysuria, frequency or urgency, or incontinence  MUSCULOSKELETAL: No unrelieved bone/joint pain NEUROLOGIC: No headache, dizziness  PSYCHIATRIC: No overt anxiety or sadness  Vitals:   09/18/18 1452  BP: 118/60  Pulse: 74  Resp: 18  Temp: 98 F (36.7 C)   Body mass index is 26.95 kg/m. Physical Exam  GENERAL APPEARANCE: Alert, moderately conversant, No acute distress  SKIN: No diaphoresis rash HEENT: Unremarkable RESPIRATORY: Breathing is even, unlabored. Lung sounds are clear   CARDIOVASCULAR: Heart RRR no murmurs, rubs or gallops. No peripheral edema  GASTROINTESTINAL: Abdomen is soft, non-tender, not distended w/ normal bowel sounds.  GENITOURINARY: Bladder non tender, not distended  MUSCULOSKELETAL: No abnormal joints or musculature NEUROLOGIC: Cranial nerves 2-12 grossly intact. Moves all extremities PSYCHIATRIC: Flat affect, no behavioral issues  Patient Active Problem List   Diagnosis Date Noted  . Enterococcus UTI 04/29/2018  . CAP (community acquired pneumonia) 04/25/2018  . Acute encephalopathy 04/22/2018  . HCAP (healthcare-associated pneumonia) 04/22/2018  . Post-ictal state (HCC) 02/18/2018  . Urinary tract infection with hematuria   . Altered mental status   . Seizure (HCC) 02/11/2018  . Chronic constipation 02/09/2018  . Dementia (HCC) 12/16/2017  . Failure to thrive in adult  12/16/2017  . Hypernatremia 12/16/2017  . Hypokalemia 12/16/2017  . Hypertension 12/16/2017  . 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 Hyperlipidemia 12/16/2017  . Osteoporosis 12/16/2017  . Vitamin B12 deficiency 12/16/2017  . Depression 12/16/2017    CMP     Component Value Date/Time   NA 146 07/27/2018   K 3.8 07/27/2018   CL 107 04/25/2018 0642   CO2 25 04/25/2018 0642   GLUCOSE 85 04/25/2018 0642   BUN 11 07/27/2018   CREATININE 0.4 (A) 07/27/2018   CREATININE 0.58 04/25/2018 0642   CALCIUM 8.9 04/25/2018 0642   PROT 5.8 (L) 04/23/2018 0822   ALBUMIN 2.9 (L) 04/23/2018 0822   AST 23 04/23/2018 0822   ALT 10 04/23/2018 0822   ALKPHOS 73 04/23/2018 0822   BILITOT 1.2 04/23/2018 0822   GFRNONAA >60 04/25/2018 0642   GFRAA >60 04/25/2018 [id number]  Recent Labs    04/22/18 2034 04/22/18 2043 04/23/18 0822 04/25/18 0642 05/01/18 07/27/18  NA 145 147* 147* 142 145 146  K 3.2* 3.2* 4.5 3.5 3.8 3.8  CL 109 109 114* 107  --   --   CO2 25  --  27 25  --   --   GLUCOSE 163* 156* 95 85  --   --   BUN 13 14 10  <5* 15 11  CREATININE 0.88 0.80 0.61 0.58 0.5 0.4*  CALCIUM 9.3  --  8.7* 8.9  --   --    Recent Labs    02/11/18 1325 04/22/18 2034 04/23/18 0822  AST 23 20 23   ALT 8* 11 10  ALKPHOS 78 77 73  BILITOT 0.9 0.9 1.2  PROT 5.7* 6.7 5.8*  ALBUMIN 3.0* 3.3* 2.9*   Recent Labs    02/11/18 1325  02/13/18 1002 04/22/18 2034  04/23/18 0822 05/01/18 07/22/18  WBC 7.5  7.5   < > 10.5 7.1  --  7.9 4.7 7.2  NEUTROABS 3.6  --   --  3.8  --   --   --  6  HGB 13.2  13.2   < > 13.4 13.5   < > 13.1 12.7 15.4  HCT 41.2  41   < > 41.1 42.4   < > 39.8 39 46  MCV 98.3   < > 97.9 96.8  --  93.4  --   --   PLT PLATELET CLUMPS NOTED ON SMEAR, COUNT APPEARS ADEQUATE   < > 130* 153  --  117* 169 120*   < > = values in this interval not displayed.   Recent Labs    02/12/18 0521  CHOL 152  LDLCALC 99  TRIG 21   No results found for: [facility] Lab Results  Component  Value Date   TSH 1.25 07/23/2018   Lab Results  Component Value Date   HGBA1C 4.2 (L) 02/12/2018   Lab Results  Component Value Date   CHOL 152 02/12/2018   HDL 49 02/12/2018   LDLCALC 99 02/12/2018   TRIG 21 02/12/2018   CHOLHDL 3.1 02/12/2018    Significant Diagnostic Results in last 30 days:  No results found.  Assessment and Plan  Dementia Chronic and stable; continue Exelon 6 mg p.o. twice daily and Namenda XR 28 mg 1 p.o. daily  Failure to thrive in adult Patient is not eating as well as she was prior but has plateaued at the moment; continue supportive care  Vitamin B12 deficiency Stable; continue thousand micrograms p.o. daily     Thurston Hole D. Lyn Hollingshead, MD

## 2018-09-29 ENCOUNTER — Encounter: Payer: Self-pay | Admitting: Internal Medicine

## 2018-09-29 NOTE — Assessment & Plan Note (Signed)
Stable; continue thousand micrograms p.o. daily

## 2018-09-29 NOTE — Assessment & Plan Note (Signed)
Patient is not eating as well as she was prior but has plateaued at the moment; continue supportive care

## 2018-09-29 NOTE — Assessment & Plan Note (Signed)
Chronic and stable; continue Exelon 6 mg p.o. twice daily and Namenda XR 28 mg 1 p.o. daily

## 2018-10-17 DIAGNOSIS — R488 Other symbolic dysfunctions: Secondary | ICD-10-CM | POA: Diagnosis not present

## 2018-10-19 ENCOUNTER — Non-Acute Institutional Stay (SKILLED_NURSING_FACILITY): Payer: Medicare Other | Admitting: Internal Medicine

## 2018-10-19 DIAGNOSIS — N760 Acute vaginitis: Secondary | ICD-10-CM | POA: Diagnosis not present

## 2018-10-19 DIAGNOSIS — B9689 Other specified bacterial agents as the cause of diseases classified elsewhere: Secondary | ICD-10-CM | POA: Diagnosis not present

## 2018-10-19 DIAGNOSIS — F482 Pseudobulbar affect: Secondary | ICD-10-CM

## 2018-10-19 DIAGNOSIS — R569 Unspecified convulsions: Secondary | ICD-10-CM | POA: Diagnosis not present

## 2018-10-20 ENCOUNTER — Encounter: Payer: Self-pay | Admitting: Internal Medicine

## 2018-10-20 NOTE — Assessment & Plan Note (Signed)
Appears to be controlled; continue Nuedexta 1 p.o. daily

## 2018-10-20 NOTE — Progress Notes (Signed)
Location:  [facility]   Place of Service:  SNF (31)  [name], Anne D, MD  Patient Care Team: [name], Anne D, MD as PCP - General (Internal Medicine)  Extended Emergency Contact Information Primary Emergency Contact: [name],[name] Address: [address]          [place], [place] [address] [place] Home Phone: [phone] Relation: Daughter Secondary Emergency Contact: [name], Tracy Mobile Phone: [phone] Relation: Daughter    Allergies: Penicillins  Chief Complaint  Patient presents with  . Medical Management of Chronic Issues    HPI: Patient is 78 y.o. female who is being seen for routine issues of seizure and PBA and for Gardnerella vaginitis.  Past Medical History:  Diagnosis Date  . Acute encephalopathy   . Altered mental status   . Alzheimer's disease (HCC)   . Chronic constipation   . Dementia (HCC)   . Depressed   . Failure to thrive in adult   . HCAP (healthcare-associated pneumonia)   . Hyperlipidemia   . Hypernatremia   . Hypertension   . Hypokalemia   . Low back pain   . Migraines   . Osteoarthritis   . Osteoporosis   . PBA (pseudobulbar affect)   . Post-ictal state (HCC)   . Seizures (HCC)   . Urinary tract infection with hematuria   . Vitamin B12 deficiency     Past Surgical History:  Procedure Laterality Date  . ABDOMINAL HYSTERECTOMY    . CHOLECYSTECTOMY    . KNEE SURGERY     total    Allergies as of 10/19/2018      Reactions   Penicillins Other (See Comments)   Tolerated Ceftriaxone 04/2018 Other reaction(s): UNKNOWN REACTION      Medication List       Accurate as of October 19, 2018 11:59 PM. Always use your most recent med list.        acetaminophen 325 MG tablet Commonly known as:  TYLENOL Take 650 mg by mouth every 6 (six) hours as needed.   amLODipine 10 MG tablet Commonly known as:  NORVASC Take 10 mg by mouth daily.   aspirin EC 81 MG EC tablet Generic drug:   aspirin Take 81 mg by mouth daily. Swallow whole.   bisacodyl 10 MG suppository Commonly known as:  DULCOLAX Place 10 mg rectally daily as needed for moderate constipation (if not relieved by MOM).   bisacodyl 10 MG/30ML Enem Commonly known as:  FLEET Place 10 mg rectally daily as needed (if not relieved by Bisacodyl suppository).   calcium carbonate 1250 (500 Ca) MG chewable tablet Commonly known as:  OS-CAL Chew 1 tablet by mouth daily.   D3-1000 25 MCG (1000 UT) tablet Generic drug:  Cholecalciferol Take 1,000 Units by mouth daily.   FISH OIL PO Take by mouth. 1,600 MG/5 ML LIQUID- take 5 ml po daily as supplement   hydrALAZINE 50 MG tablet Commonly known as:  APRESOLINE Take 50 mg by mouth 2 (two) times daily.   levETIRAcetam 100 MG/ML solution Commonly known as:  KEPPRA Take 5 mLs (500 mg total) by mouth 2 (two) times daily.   MILK OF MAGNESIA PO Take 30 mLs by mouth daily as needed (if no BM in 3 days).   NAMENDA XR 28 MG Cp24 24 hr capsule Generic drug:  memantine Take 28 mg by mouth at bedtime.   NUEDEXTA 20-10 MG capsule Generic drug:  Dextromethorphan-quiNIDine Take 1 capsule by mouth daily.  NUTRITIONAL SUPPLEMENTS PO Initiate 120 ml Medpass 3 times daily with meals related to weight loss   polyethylene glycol packet Commonly known as:  MIRALAX / GLYCOLAX Take 17 g by mouth daily.   pravastatin 40 MG tablet Commonly known as:  PRAVACHOL Take 40 mg by mouth daily.   rivastigmine 6 MG capsule Commonly known as:  EXELON Take 6 mg by mouth 2 (two) times daily.   vitamin B-12 1000 MCG tablet Commonly known as:  CYANOCOBALAMIN Take 1,000 mcg by mouth daily.       No orders of the defined types were placed in this encounter.   Immunization History  Administered Date(s) Administered  . Influenza, High Dose Seasonal PF 08/01/2016  . Influenza-Unspecified 07/31/2017, 07/05/2018  . PPD Test 12/09/2017  . Pneumococcal Conjugate-13 08/01/2016  .  Tdap 12/16/2014    Social History   Tobacco Use  . Smoking status: Former Games developer  . Smokeless tobacco: Never Used  Substance Use Topics  . Alcohol use: No    Review of Systems  DATA OBTAINED: from patient-limited; nursing-patient with vaginal discharge GENERAL:  no fevers, fatigue, appetite changes SKIN: No itching, rash HEENT: No complaint RESPIRATORY: No cough, wheezing, SOB CARDIAC: No chest pain, palpitations, lower extremity edema  GI: No abdominal pain, No N/V/D or constipation, No heartburn or reflux  GU: No dysuria, frequency or urgency, or incontinence: Complains of vaginal discharge with odor white-brown MUSCULOSKELETAL: No unrelieved bone/joint pain NEUROLOGIC: No headache, dizziness  PSYCHIATRIC: No overt anxiety or sadness  Vitals:   10/20/18 1609  BP: 122/65  Pulse: 78  Resp: 16  Temp: (!) 97.2 F (36.2 C)   There is no height or weight on file to calculate BMI. Physical Exam  GENERAL APPEARANCE: Alert, normally conversant, No acute distress  SKIN: No diaphoresis rash HEENT: Unremarkable RESPIRATORY: Breathing is even, unlabored. Lung sounds are clear   CARDIOVASCULAR: Heart RRR no murmurs, rubs or gallops. No peripheral edema  GASTROINTESTINAL: Abdomen is soft, non-tender, not distended w/ normal bowel sounds.  GENITOURINARY: Bladder non tender, not distended  MUSCULOSKELETAL: No abnormal joints or musculature NEUROLOGIC: Cranial nerves 2-12 grossly intact. Moves all extremities PSYCHIATRIC: Mood and affect flat with dementia, no behavioral issues  Patient Active Problem List   Diagnosis Date Noted  . Enterococcus UTI 04/29/2018  . CAP (community acquired pneumonia) 04/25/2018  . Acute encephalopathy 04/22/2018  . HCAP (healthcare-associated pneumonia) 04/22/2018  . Post-ictal state (HCC) 02/18/2018  . Urinary tract infection with hematuria   . Altered mental status   . Seizure (HCC) 02/11/2018  . Chronic constipation 02/09/2018  . Dementia  (HCC) 12/16/2017  . Failure to thrive in adult 12/16/2017  . Hypernatremia 12/16/2017  . Hypokalemia 12/16/2017  . Hypertension 12/16/2017  . 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 Hyperlipidemia 12/16/2017  . Osteoporosis 12/16/2017  . Vitamin B12 deficiency 12/16/2017  . Depression 12/16/2017    CMP     Component Value Date/Time   NA 146 07/27/2018   K 3.8 07/27/2018   CL 107 04/25/2018 0642   CO2 25 04/25/2018 0642   GLUCOSE 85 04/25/2018 0642   BUN 11 07/27/2018   CREATININE 0.4 (A) 07/27/2018   CREATININE 0.58 04/25/2018 0642   CALCIUM 8.9 04/25/2018 0642   PROT 5.8 (L) 04/23/2018 0822   ALBUMIN 2.9 (L) 04/23/2018 0822   AST 23 04/23/2018 0822   ALT 10 04/23/2018 0822   ALKPHOS 73 04/23/2018 0822   BILITOT 1.2 04/23/2018 0822   GFRNONAA >60 04/25/2018 0881  GFRAA >60 04/25/2018 0642   Recent Labs    04/22/18 2034 04/22/18 2043 04/23/18 0822 04/25/18 0642 05/01/18 07/27/18  NA 145 147* 147* 142 145 146  K 3.2* 3.2* 4.5 3.5 3.8 3.8  CL 109 109 114* 107  --   --   CO2 25  --  27 25  --   --   GLUCOSE 163* 156* 95 85  --   --   BUN 13 14 10  <5* 15 11  CREATININE 0.88 0.80 0.61 0.58 0.5 0.4*  CALCIUM 9.3  --  8.7* 8.9  --   --    Recent Labs    02/11/18 1325 04/22/18 2034 04/23/18 0822  AST 23 20 23   ALT 8* 11 10  ALKPHOS 78 77 73  BILITOT 0.9 0.9 1.2  PROT 5.7* 6.7 5.8*  ALBUMIN 3.0* 3.3* 2.9*   Recent Labs    02/11/18 1325  02/13/18 1002 04/22/18 2034  04/23/18 0822 05/01/18 07/22/18  WBC 7.5  7.5   < > 10.5 7.1  --  7.9 4.7 7.2  NEUTROABS 3.6  --   --  3.8  --   --   --  6  HGB 13.2  13.2   < > 13.4 13.5   < > 13.1 12.7 15.4  HCT 41.2  41   < > 41.1 42.4   < > 39.8 39 46  MCV 98.3   < > 97.9 96.8  --  93.4  --   --   PLT PLATELET CLUMPS NOTED ON SMEAR, COUNT APPEARS ADEQUATE   < > 130* 153  --  117* 169 120*   < > = values in this interval not displayed.   Recent Labs    02/12/18 0521  CHOL 152  LDLCALC 99  TRIG 21   No results  found for: [facility] Lab Results  Component Value Date   TSH 1.25 07/23/2018   Lab Results  Component Value Date   HGBA1C 4.2 (L) 02/12/2018   Lab Results  Component Value Date   CHOL 152 02/12/2018   HDL 49 02/12/2018   LDLCALC 99 02/12/2018   TRIG 21 02/12/2018   CHOLHDL 3.1 02/12/2018    Significant Diagnostic Results in last 30 days:  No results found.  Assessment and Plan  Seizure (HCC) No recent reported breakthrough seizures; continue Keppra 500 mg twice daily  PBA (pseudobulbar affect) Appears to be controlled; continue Nuedexta 1 p.o. daily  Gardnerella vaginitis-presumed as there is no way to check but likely based on symptoms; Flagyl 500 mg twice daily x7 days  Merrilee SeashoreAnne Alexander, MD

## 2018-10-20 NOTE — Assessment & Plan Note (Signed)
No recent reported breakthrough seizures; continue Keppra 500 mg twice daily

## 2018-11-20 ENCOUNTER — Encounter: Payer: Self-pay | Admitting: Internal Medicine

## 2018-11-20 ENCOUNTER — Non-Acute Institutional Stay (SKILLED_NURSING_FACILITY): Payer: Medicare Other | Admitting: Internal Medicine

## 2018-11-20 DIAGNOSIS — G301 Alzheimer's disease with late onset: Secondary | ICD-10-CM | POA: Diagnosis not present

## 2018-11-20 DIAGNOSIS — K5909 Other constipation: Secondary | ICD-10-CM

## 2018-11-20 DIAGNOSIS — I10 Essential (primary) hypertension: Secondary | ICD-10-CM | POA: Diagnosis not present

## 2018-11-20 DIAGNOSIS — M810 Age-related osteoporosis without current pathological fracture: Secondary | ICD-10-CM

## 2018-11-20 DIAGNOSIS — R4020 Unspecified coma: Secondary | ICD-10-CM | POA: Diagnosis not present

## 2018-11-20 DIAGNOSIS — R55 Syncope and collapse: Secondary | ICD-10-CM | POA: Diagnosis not present

## 2018-11-20 DIAGNOSIS — R001 Bradycardia, unspecified: Secondary | ICD-10-CM | POA: Diagnosis not present

## 2018-11-20 NOTE — Progress Notes (Signed)
Location:  [facility] Room Number: 203D Place of Service:  SNF [phone])  Randon Goldsmith. Lyn Hollingshead, MD  Patient Care Team: [name], MD as PCP - General (Internal Medicine)  Extended Emergency Contact Information Primary Emergency Contact: [name],[name] Address: [address]          [place], [place] [address] [place] Home Phone: [phone] Relation: Daughter Secondary Emergency Contact: [name] Mobile Phone: [phone] Relation: Daughter    Allergies: Penicillins  Chief Complaint  Patient presents with  . Medical Management of Chronic Issues    Routine visit    HPI: Patient is 78 y.o. female who is being seen for routine issues of hypertension, constipation, and osteoporosis.  Past Medical History:  Diagnosis Date  . Acute encephalopathy   . Altered mental status   . Alzheimer's disease (HCC)   . Chronic constipation   . Dementia (HCC)   . Depressed   . Failure to thrive in adult   . HCAP (healthcare-associated pneumonia)   . Hyperlipidemia   . Hypernatremia   . Hypertension   . Hypokalemia   . Low back pain   . Migraines   . Osteoarthritis   . Osteoporosis   . PBA (pseudobulbar affect)   . Post-ictal state (HCC)   . Seizures (HCC)   . Urinary tract infection with hematuria   . Vitamin B12 deficiency     Past Surgical History:  Procedure Laterality Date  . ABDOMINAL HYSTERECTOMY    . CHOLECYSTECTOMY    . KNEE SURGERY     total    Allergies as of 11/20/2018      Reactions   Penicillins Other (See Comments)   Tolerated Ceftriaxone 04/2018 Other reaction(s): UNKNOWN REACTION      Medication List       Accurate as of November 20, 2018 11:59 PM. Always use your most recent med list.        acetaminophen 325 MG tablet Commonly known as:  TYLENOL Take 650 mg by mouth every 6 (six) hours as needed.   amLODipine 10 MG tablet Commonly known as:  NORVASC Take 10 mg by mouth daily.     aspirin EC 81 MG EC tablet Generic drug:  aspirin Take 81 mg by mouth daily. Swallow whole.   bisacodyl 10 MG suppository Commonly known as:  DULCOLAX Place 10 mg rectally daily as needed for moderate constipation (if not relieved by MOM).   bisacodyl 10 MG/30ML Enem Commonly known as:  FLEET Place 10 mg rectally daily as needed (if not relieved by Bisacodyl suppository).   calcium carbonate 1250 (500 Ca) MG chewable tablet Commonly known as:  OS-CAL Chew 1 tablet by mouth daily.   D3-1000 25 MCG (1000 UT) tablet Generic drug:  Cholecalciferol Take 1,000 Units by mouth daily.   FISH OIL PO Take by mouth. 1,600 MG/5 ML LIQUID- take 5 ml po daily as supplement   hydrALAZINE 50 MG tablet Commonly known as:  APRESOLINE Take 50 mg by mouth 2 (two) times daily.   levETIRAcetam 100 MG/ML solution Commonly known as:  KEPPRA Take 5 mLs (500 mg total) by mouth 2 (two) times daily.   MILK OF MAGNESIA PO Take 30 mLs by mouth daily as needed (if no BM in 3 days).   NAMENDA XR 28 MG Cp24 24 hr capsule Generic drug:  memantine Take 28 mg by mouth at bedtime.   NUEDEXTA 20-10 MG capsule Generic drug:  Dextromethorphan-quiNIDine Take  1 capsule by mouth daily.   NUTRITIONAL SUPPLEMENTS PO Initiate 120 ml Medpass 3 times daily with meals related to weight loss   polyethylene glycol packet Commonly known as:  MIRALAX / GLYCOLAX Take 17 g by mouth daily.   pravastatin 40 MG tablet Commonly known as:  PRAVACHOL Take 40 mg by mouth daily.   rivastigmine 6 MG capsule Commonly known as:  EXELON Take 6 mg by mouth 2 (two) times daily.   vitamin B-12 1000 MCG tablet Commonly known as:  CYANOCOBALAMIN Take 1,000 mcg by mouth daily.       No orders of the defined types were placed in this encounter.   Immunization History  Administered Date(s) Administered  . Influenza, High Dose Seasonal PF 08/01/2016  . Influenza-Unspecified 07/31/2017, 07/05/2018  . PPD Test 12/09/2017   . Pneumococcal Conjugate-13 08/01/2016  . Tdap 12/16/2014    Social History   Tobacco Use  . Smoking status: Former Games developer  . Smokeless tobacco: Never Used  Substance Use Topics  . Alcohol use: No    Review of Systems  DATA OBTAINED: from patient-limited; nursing-no acute concerns GENERAL:  no fevers, fatigue, appetite changes SKIN: No itching, rash HEENT: No complaint RESPIRATORY: No cough, wheezing, SOB CARDIAC: No chest pain, palpitations, lower extremity edema  GI: No abdominal pain, No N/V/D or constipation, No heartburn or reflux  GU: No dysuria, frequency or urgency, or incontinence  MUSCULOSKELETAL: No unrelieved bone/joint pain NEUROLOGIC: No headache, dizziness  PSYCHIATRIC: No overt anxiety or sadness  Vitals:   11/20/18 1139  BP: 122/70  Pulse: 70  Resp: 18  Temp: 98 F (36.7 C)   Body mass index is 24.89 kg/m. Physical Exam  GENERAL APPEARANCE: Alert, conversant, No acute distress  SKIN: No diaphoresis rash HEENT: Unremarkable RESPIRATORY: Breathing is even, unlabored. Lung sounds are clear   CARDIOVASCULAR: Heart RRR no murmurs, rubs or gallops. No peripheral edema  GASTROINTESTINAL: Abdomen is soft, non-tender, not distended w/ normal bowel sounds.  GENITOURINARY: Bladder non tender, not distended  MUSCULOSKELETAL: No abnormal joints or musculature NEUROLOGIC: Cranial nerves 2-12 grossly intact. Moves all extremities PSYCHIATRIC: Mood and affect with dementia, no behavioral issues  Patient Active Problem List   Diagnosis Date Noted  . Enterococcus UTI 04/29/2018  . CAP (community acquired pneumonia) 04/25/2018  . Acute encephalopathy 04/22/2018  . HCAP (healthcare-associated pneumonia) 04/22/2018  . Post-ictal state (HCC) 02/18/2018  . Urinary tract infection with hematuria   . Altered mental status   . Seizure (HCC) 02/11/2018  . Chronic constipation 02/09/2018  . Dementia (HCC) 12/16/2017  . Failure to thrive in adult 12/16/2017  .  Hypernatremia 12/16/2017  . Hypokalemia 12/16/2017  . Hypertension 12/16/2017  . 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 Hyperlipidemia 12/16/2017  . Osteoporosis 12/16/2017  . Vitamin B12 deficiency 12/16/2017  . Depression 12/16/2017    CMP     Component Value Date/Time   NA 146 07/27/2018   K 3.8 07/27/2018   CL 107 04/25/2018 0642   CO2 25 04/25/2018 0642   GLUCOSE 85 04/25/2018 0642   BUN 11 07/27/2018   CREATININE 0.4 (A) 07/27/2018   CREATININE 0.58 04/25/2018 0642   CALCIUM 8.9 04/25/2018 0642   PROT 5.8 (L) 04/23/2018 0822   ALBUMIN 2.9 (L) 04/23/2018 0822   AST 23 04/23/2018 0822   ALT 10 04/23/2018 0822   ALKPHOS 73 04/23/2018 0822   BILITOT 1.2 04/23/2018 0822   GFRNONAA >60 04/25/2018 0642   GFRAA >60 04/25/2018 0642   Recent Labs  04/22/18 2034 04/22/18 2043 04/23/18 0822 04/25/18 0642 05/01/18 07/27/18  NA 145 147* 147* 142 145 146  K 3.2* 3.2* 4.5 3.5 3.8 3.8  CL 109 109 114* 107  --   --   CO2 25  --  27 25  --   --   GLUCOSE 163* 156* 95 85  --   --   BUN 13 14 10  <5* 15 11  CREATININE 0.88 0.80 0.61 0.58 0.5 0.4*  CALCIUM 9.3  --  8.7* 8.9  --   --    Recent Labs    02/11/18 1325 04/22/18 2034 04/23/18 0822  AST 23 20 23   ALT 8* 11 10  ALKPHOS 78 77 73  BILITOT 0.9 0.9 1.2  PROT 5.7* 6.7 5.8*  ALBUMIN 3.0* 3.3* 2.9*   Recent Labs    02/11/18 1325  02/13/18 1002 04/22/18 2034  04/23/18 0822 05/01/18 07/22/18  WBC 7.5  7.5   < > 10.5 7.1  --  7.9 4.7 7.2  NEUTROABS 3.6  --   --  3.8  --   --   --  6  HGB 13.2  13.2   < > 13.4 13.5   < > 13.1 12.7 15.4  HCT 41.2  41   < > 41.1 42.4   < > 39.8 39 46  MCV 98.3   < > 97.9 96.8  --  93.4  --   --   PLT PLATELET CLUMPS NOTED ON SMEAR, COUNT APPEARS ADEQUATE   < > 130* 153  --  117* 169 120*   < > = values in this interval not displayed.   Recent Labs    02/12/18 0521  CHOL 152  LDLCALC 99  TRIG 21   [facility] results found for: [facility] Lab Results  Component Value Date    TSH 1.25 07/23/2018   Lab Results  Component Value Date   HGBA1C 4.2 (L) 02/12/2018   Lab Results  Component Value Date   CHOL 152 02/12/2018   HDL 49 02/12/2018   LDLCALC 99 02/12/2018   TRIG 21 02/12/2018   CHOLHDL 3.1 02/12/2018    Significant Diagnostic Results in last 30 days:  No results found.  Assessment and Plan  Hypertension Controlled; continue hydralazine 50 mg twice daily and Norvasc 10 mg daily  Chronic constipation No reported problems; continue daily MiraLAX, patient also has Dulcolax suppository and fleets enemas ordered on a as needed basis  Osteoporosis Stable without fractures; continue vitamin D at thousand units daily and Os-Cal 1250 mg chewables 1 daily     Daekwon Beswick D. Lyn HollingsheadAlexander, MD

## 2018-11-21 DIAGNOSIS — R9431 Abnormal electrocardiogram [ECG] [EKG]: Secondary | ICD-10-CM | POA: Diagnosis not present

## 2018-11-24 ENCOUNTER — Encounter: Payer: Self-pay | Admitting: Internal Medicine

## 2018-11-24 NOTE — Assessment & Plan Note (Signed)
Stable without fractures; continue vitamin D at thousand units daily and Os-Cal 1250 mg chewables 1 daily

## 2018-11-24 NOTE — Assessment & Plan Note (Signed)
No reported problems; continue daily MiraLAX, patient also has Dulcolax suppository and fleets enemas ordered on a as needed basis

## 2018-11-24 NOTE — Assessment & Plan Note (Signed)
Controlled; continue hydralazine 50 mg twice daily and Norvasc 10 mg daily

## 2018-12-08 DIAGNOSIS — R001 Bradycardia, unspecified: Secondary | ICD-10-CM | POA: Diagnosis not present

## 2018-12-20 ENCOUNTER — Non-Acute Institutional Stay (SKILLED_NURSING_FACILITY): Payer: Medicare Other | Admitting: Internal Medicine

## 2018-12-20 ENCOUNTER — Encounter: Payer: Self-pay | Admitting: Internal Medicine

## 2018-12-20 DIAGNOSIS — G300 Alzheimer's disease with early onset: Secondary | ICD-10-CM | POA: Diagnosis not present

## 2018-12-20 DIAGNOSIS — E538 Deficiency of other specified B group vitamins: Secondary | ICD-10-CM

## 2018-12-20 DIAGNOSIS — R627 Adult failure to thrive: Secondary | ICD-10-CM | POA: Diagnosis not present

## 2018-12-20 DIAGNOSIS — F0280 Dementia in other diseases classified elsewhere without behavioral disturbance: Secondary | ICD-10-CM | POA: Diagnosis not present

## 2018-12-20 NOTE — Progress Notes (Signed)
Location:  [facility] Room Number: 203D Place of Service:  SNF [phone])  Melissa Jarvis. Lyn Hollingshead, MD  Patient Care Team: [name], MD as PCP - General (Internal Medicine)  Extended Emergency Contact Information Primary Emergency Contact: [name],[name] Address: [address]          [place], [place] [address] [place] Home Phone: [phone] Relation: Daughter Secondary Emergency Contact: [name] Mobile Phone: [phone] Relation: Daughter    Allergies: Penicillins  Chief Complaint  Patient presents with  . Medical Management of Chronic Issues    Routine visit    HPI: Patient is 78 y.o. female who is being seen for routine issues of dementia, failure to thrive, and B12 deficiency.  Past Medical History:  Diagnosis Date  . Acute encephalopathy   . Altered mental status   . Alzheimer's disease (HCC)   . Chronic constipation   . Dementia (HCC)   . Depressed   . Failure to thrive in adult   . HCAP (healthcare-associated pneumonia)   . Hyperlipidemia   . Hypernatremia   . Hypertension   . Hypokalemia   . Low back pain   . Migraines   . Osteoarthritis   . Osteoporosis   . PBA (pseudobulbar affect)   . Post-ictal state (HCC)   . Seizures (HCC)   . Urinary tract infection with hematuria   . Vitamin B12 deficiency     Past Surgical History:  Procedure Laterality Date  . ABDOMINAL HYSTERECTOMY    . CHOLECYSTECTOMY    . KNEE SURGERY     total    Allergies as of 12/20/2018      Reactions   Penicillins Other (See Comments)   Tolerated Ceftriaxone 04/2018 Other reaction(s): UNKNOWN REACTION      Medication List       Accurate as of December 20, 2018 11:59 PM. Always use your most recent med list.        acetaminophen 325 MG tablet Commonly known as:  TYLENOL Take 650 mg by mouth every 6 (six) hours as needed.   amLODipine 10 MG tablet Commonly known as:  NORVASC Take 10 mg by mouth daily.     aspirin EC 81 MG EC tablet Generic drug:  aspirin Take 81 mg by mouth daily. Swallow whole.   bisacodyl 10 MG suppository Commonly known as:  DULCOLAX Place 10 mg rectally daily as needed for moderate constipation (if not relieved by MOM).   bisacodyl 10 MG/30ML Enem Commonly known as:  FLEET Place 10 mg rectally daily as needed (if not relieved by Bisacodyl suppository).   calcium carbonate 1250 (500 Ca) MG chewable tablet Commonly known as:  OS-CAL Chew 1 tablet by mouth daily.   D3-1000 25 MCG (1000 UT) tablet Generic drug:  Cholecalciferol Take 1,000 Units by mouth daily.   FISH OIL PO Take by mouth. 1,600 MG/5 ML LIQUID- take 5 ml po daily as supplement   hydrALAZINE 50 MG tablet Commonly known as:  APRESOLINE Take 50 mg by mouth 2 (two) times daily.   levETIRAcetam 100 MG/ML solution Commonly known as:  KEPPRA Take 5 mLs (500 mg total) by mouth 2 (two) times daily.   MILK OF MAGNESIA PO Take 30 mLs by mouth daily as needed (if no BM in 3 days).   Namenda XR 28 MG Cp24 24 hr capsule Generic drug:  memantine Take 28 mg by mouth at bedtime.   Nuedexta 20-10 MG capsule Generic drug:  Dextromethorphan-quiNIDine Take 1 capsule by mouth daily.   NUTRITIONAL SUPPLEMENTS PO Initiate 120 ml Medpass 3 times daily with meals related to weight loss   polyethylene glycol packet Commonly known as:  MIRALAX / GLYCOLAX Take 17 g by mouth daily.   pravastatin 40 MG tablet Commonly known as:  PRAVACHOL Take 40 mg by mouth daily.   rivastigmine 6 MG capsule Commonly known as:  EXELON Take 6 mg by mouth 2 (two) times daily.   vitamin B-12 1000 MCG tablet Commonly known as:  CYANOCOBALAMIN Take 1,000 mcg by mouth daily.       No orders of the defined types were placed in this encounter.   Immunization History  Administered Date(s) Administered  . Influenza, High Dose Seasonal PF 08/01/2016  . Influenza-Unspecified 07/31/2017, 07/05/2018  . PPD Test 12/09/2017   . Pneumococcal Conjugate-13 08/01/2016  . Tdap 12/16/2014    Social History   Tobacco Use  . Smoking status: Former Games developer  . Smokeless tobacco: Never Used  Substance Use Topics  . Alcohol use: No    Review of Systems  DATA OBTAINED: from patient-minimal; nursing-no acute concerns GENERAL:  no fevers, fatigue, appetite changes SKIN: No itching, rash HEENT: No complaint RESPIRATORY: No cough, wheezing, SOB CARDIAC: No chest pain, palpitations, lower extremity edema  GI: No abdominal pain, No N/V/D or constipation, No heartburn or reflux  GU: No dysuria, frequency or urgency, or incontinence  MUSCULOSKELETAL: No unrelieved bone/joint pain NEUROLOGIC: No headache, dizziness  PSYCHIATRIC: No overt anxiety or sadness  Vitals:   12/20/18 1523  BP: 122/68  Pulse: 77  Resp: 16  Temp: 98 F (36.7 C)   Body mass index is 24.86 kg/m. Physical Exam  GENERAL APPEARANCE: Alert, conversant, No acute distress  SKIN: No diaphoresis rash HEENT: Unremarkable RESPIRATORY: Breathing is even, unlabored. Lung sounds are clear   CARDIOVASCULAR: Heart RRR no murmurs, rubs or gallops. No peripheral edema  GASTROINTESTINAL: Abdomen is soft, non-tender, not distended w/ normal bowel sounds.  GENITOURINARY: Bladder non tender, not distended  MUSCULOSKELETAL: No abnormal joints or musculature NEUROLOGIC: Cranial nerves 2-12 grossly intact. Moves all extremities PSYCHIATRIC: Mood and affect flat with dementia, no behavioral issues  Patient Active Problem List   Diagnosis Date Noted  . Enterococcus UTI 04/29/2018  . CAP (community acquired pneumonia) 04/25/2018  . Acute encephalopathy 04/22/2018  . HCAP (healthcare-associated pneumonia) 04/22/2018  . Post-ictal state (HCC) 02/18/2018  . Urinary tract infection with hematuria   . Altered mental status   . Seizure (HCC) 02/11/2018  . Chronic constipation 02/09/2018  . Dementia (HCC) 12/16/2017  . Failure to thrive in adult 12/16/2017   . Hypernatremia 12/16/2017  . Hypokalemia 12/16/2017  . Hypertension 12/16/2017  . 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 Hyperlipidemia 12/16/2017  . Osteoporosis 12/16/2017  . Vitamin B12 deficiency 12/16/2017  . Depression 12/16/2017    CMP     Component Value Date/Time   NA 146 07/27/2018   K 3.8 07/27/2018   CL 107 04/25/2018 0642   CO2 25 04/25/2018 0642   GLUCOSE 85 04/25/2018 0642   BUN 11 07/27/2018   CREATININE 0.4 (A) 07/27/2018   CREATININE 0.58 04/25/2018 0642   CALCIUM 8.9 04/25/2018 0642   PROT 5.8 (L) 04/23/2018 0822   ALBUMIN 2.9 (L) 04/23/2018 0822   AST 23 04/23/2018 0822   ALT 10 04/23/2018 0822   ALKPHOS 73 04/23/2018 0822   BILITOT 1.2 04/23/2018 0822   GFRNONAA >60 04/25/2018 0642   GFRAA >60 04/25/2018 1594  Recent Labs    04/22/18 2034 04/22/18 2043 04/23/18 0822 04/25/18 0642 05/01/18 07/27/18  NA 145 147* 147* 142 145 146  K 3.2* 3.2* 4.5 3.5 3.8 3.8  CL 109 109 114* 107  --   --   CO2 25  --  27 25  --   --   GLUCOSE 163* 156* 95 85  --   --   BUN <5* 15 11  CREATININE 0.88 0.80 0.61 0.58 0.5 0.4*  CALCIUM 9.3  --  8.7* 8.9  --   --    Recent Labs    02/11/18 1325 04/22/18 2034 04/23/18 0822  AST ALT 8* 11 10  ALKPHOS 78 77 73  BILITOT 0.9 0.9 1.2  PROT 5.7* 6.7 5.8*  ALBUMIN 3.0* 3.3* 2.9*   Recent Labs    02/11/18 1325  02/13/18 1002 04/22/18 2034  04/23/18 0822 05/01/18 07/22/18  WBC 7.5  7.5   < > 10.5 7.1  --  7.9 4.7 7.2  NEUTROABS 3.6  --   --  3.8  --   --   --  6  HGB 13.2  13.2   < > 13.4 13.5   < > 13.1 12.7 15.4  HCT 41.2  41   < > 41.1 42.4   < > 39.8 39 46  MCV 98.3   < > 97.9 96.8  --  93.4  --   --   PLT PLATELET CLUMPS NOTED ON SMEAR, COUNT APPEARS ADEQUATE   < > 130* 153  --  117* 169 120*   < > = values in this interval not displayed.   Recent Labs    02/12/18 0521  CHOL 152  LDLCALC 99  TRIG 21   No results found for: [facility] Lab Results  Component Value Date    TSH 1.25 07/23/2018   Lab Results  Component Value Date   HGBA1C 4.2 (L) 02/12/2018   Lab Results  Component Value Date   CHOL 152 02/12/2018   HDL 49 02/12/2018   LDLCALC 99 02/12/2018   TRIG 21 02/12/2018   CHOLHDL 3.1 02/12/2018    Significant Diagnostic Results in last 30 days:  No results found.  Assessment and Plan  Dementia Stable; continue Namenda XR 28 mg 1 p.o. daily and Exelon 6 mg twice daily  Failure to thrive in adult Patient is at risk to fail to thrive but is no longer at this moment failing to thrive she is eating and drinking better; continue supportive care  Vitamin B12 deficiency Stable; continue vitamin B12 thousand units daily     Kazimir Hartnett D. Lyn Hollingshead, MD

## 2018-12-22 ENCOUNTER — Encounter: Payer: Self-pay | Admitting: Internal Medicine

## 2018-12-22 NOTE — Assessment & Plan Note (Signed)
Stable; continue vitamin B12 thousand units daily

## 2018-12-22 NOTE — Assessment & Plan Note (Signed)
Patient is at risk to fail to thrive but is no longer at this moment failing to thrive she is eating and drinking better; continue supportive care

## 2018-12-22 NOTE — Assessment & Plan Note (Signed)
Stable; continue Namenda XR 28 mg 1 p.o. daily and Exelon 6 mg twice daily

## 2018-12-26 ENCOUNTER — Other Ambulatory Visit: Payer: Self-pay

## 2018-12-26 ENCOUNTER — Observation Stay (HOSPITAL_COMMUNITY)
Admission: EM | Admit: 2018-12-26 | Discharge: 2018-12-29 | Disposition: A | Discharge status: Skilled Nursing Facility | Payer: Medicare Other | Attending: Internal Medicine | Admitting: Internal Medicine

## 2018-12-26 DIAGNOSIS — F03C0 Unspecified dementia, severe, without behavioral disturbance, psychotic disturbance, mood disturbance, and anxiety: Secondary | ICD-10-CM | POA: Diagnosis present

## 2018-12-26 DIAGNOSIS — F0280 Dementia in other diseases classified elsewhere without behavioral disturbance: Secondary | ICD-10-CM | POA: Insufficient documentation

## 2018-12-26 DIAGNOSIS — Z79899 Other long term (current) drug therapy: Secondary | ICD-10-CM | POA: Diagnosis not present

## 2018-12-26 DIAGNOSIS — I10 Essential (primary) hypertension: Secondary | ICD-10-CM | POA: Diagnosis not present

## 2018-12-26 DIAGNOSIS — K5909 Other constipation: Secondary | ICD-10-CM | POA: Insufficient documentation

## 2018-12-26 DIAGNOSIS — R1311 Dysphagia, oral phase: Secondary | ICD-10-CM | POA: Insufficient documentation

## 2018-12-26 DIAGNOSIS — R0902 Hypoxemia: Secondary | ICD-10-CM | POA: Diagnosis not present

## 2018-12-26 DIAGNOSIS — Z515 Encounter for palliative care: Secondary | ICD-10-CM

## 2018-12-26 DIAGNOSIS — R1110 Vomiting, unspecified: Secondary | ICD-10-CM | POA: Diagnosis present

## 2018-12-26 DIAGNOSIS — F329 Major depressive disorder, single episode, unspecified: Secondary | ICD-10-CM | POA: Insufficient documentation

## 2018-12-26 DIAGNOSIS — Z881 Allergy status to other antibiotic agents status: Secondary | ICD-10-CM | POA: Insufficient documentation

## 2018-12-26 DIAGNOSIS — E440 Moderate protein-calorie malnutrition: Secondary | ICD-10-CM

## 2018-12-26 DIAGNOSIS — J188 Other pneumonia, unspecified organism: Secondary | ICD-10-CM | POA: Diagnosis not present

## 2018-12-26 DIAGNOSIS — Z880 Allergy status to penicillin: Secondary | ICD-10-CM | POA: Diagnosis not present

## 2018-12-26 DIAGNOSIS — E538 Deficiency of other specified B group vitamins: Secondary | ICD-10-CM | POA: Diagnosis not present

## 2018-12-26 DIAGNOSIS — G40909 Epilepsy, unspecified, not intractable, without status epilepticus: Secondary | ICD-10-CM | POA: Diagnosis not present

## 2018-12-26 DIAGNOSIS — E785 Hyperlipidemia, unspecified: Secondary | ICD-10-CM | POA: Insufficient documentation

## 2018-12-26 DIAGNOSIS — Z7189 Other specified counseling: Secondary | ICD-10-CM

## 2018-12-26 DIAGNOSIS — J189 Pneumonia, unspecified organism: Principal | ICD-10-CM | POA: Insufficient documentation

## 2018-12-26 DIAGNOSIS — G309 Alzheimer's disease, unspecified: Secondary | ICD-10-CM | POA: Insufficient documentation

## 2018-12-26 DIAGNOSIS — R569 Unspecified convulsions: Secondary | ICD-10-CM

## 2018-12-26 DIAGNOSIS — E876 Hypokalemia: Secondary | ICD-10-CM | POA: Diagnosis not present

## 2018-12-26 DIAGNOSIS — E870 Hyperosmolality and hypernatremia: Secondary | ICD-10-CM | POA: Insufficient documentation

## 2018-12-26 DIAGNOSIS — R001 Bradycardia, unspecified: Secondary | ICD-10-CM | POA: Diagnosis not present

## 2018-12-26 DIAGNOSIS — R1111 Vomiting without nausea: Secondary | ICD-10-CM | POA: Diagnosis not present

## 2018-12-26 DIAGNOSIS — R0681 Apnea, not elsewhere classified: Secondary | ICD-10-CM | POA: Diagnosis not present

## 2018-12-26 DIAGNOSIS — K7689 Other specified diseases of liver: Secondary | ICD-10-CM | POA: Diagnosis not present

## 2018-12-26 DIAGNOSIS — F0390 Unspecified dementia without behavioral disturbance: Secondary | ICD-10-CM | POA: Diagnosis present

## 2018-12-26 LAB — CBC
HCT: 45.5 % (ref 36.0–46.0)
Hemoglobin: 14.3 g/dL (ref 12.0–15.0)
MCH: 31 pg (ref 26.0–34.0)
MCHC: 31.4 g/dL (ref 30.0–36.0)
MCV: 98.5 fL (ref 80.0–100.0)
NRBC: 0 % (ref 0.0–0.2)
Platelets: 133 10*3/uL — ABNORMAL LOW (ref 150–400)
RBC: 4.62 MIL/uL (ref 3.87–5.11)
RDW: 12.3 % (ref 11.5–15.5)
WBC: 4.3 10*3/uL (ref 4.0–10.5)

## 2018-12-26 NOTE — ED Provider Notes (Signed)
[facility] EMERGENCY DEPARTMENT Provider Note   CSN: [id number] Arrival date & time: 12/26/18  2148    History   Chief Complaint No chief complaint on file.   HPI Melissa Jarvis is a 78 y.o. female.     Patient with PMH of Alzheimer's dementia, presents to the ED with a chief complaint of vomiting.  EMS was called to [facility] nursing home after the patient had one episode of projectile vomiting after dinner.  She is non verbal at baseline.  History is limited due to dementia and non-verbal state.  Level 5 caveat applies.  The history is provided by the patient. No language interpreter was used.    Past Medical History:  Diagnosis Date  . Acute encephalopathy   . Altered mental status   . Alzheimer's disease (HCC)   . Chronic constipation   . Dementia (HCC)   . Depressed   . Failure to thrive in adult   . HCAP (healthcare-associated pneumonia)   . Hyperlipidemia   . Hypernatremia   . Hypertension   . Hypokalemia   . Low back pain   . Migraines   . Osteoarthritis   . Osteoporosis   . PBA (pseudobulbar affect)   . Post-ictal state (HCC)   . Seizures (HCC)   . Urinary tract infection with hematuria   . Vitamin B12 deficiency     Patient Active Problem List   Diagnosis Date Noted  . Enterococcus UTI 04/29/2018  . CAP (community acquired pneumonia) 04/25/2018  . Acute encephalopathy 04/22/2018  . HCAP (healthcare-associated pneumonia) 04/22/2018  . Post-ictal state (HCC) 02/18/2018  . Urinary tract infection with hematuria   . Altered mental status   . Seizure (HCC) 02/11/2018  . Chronic constipation 02/09/2018  . Dementia (HCC) 12/16/2017  . Failure to thrive in adult 12/16/2017  . Hypernatremia 12/16/2017  . Hypokalemia 12/16/2017  . Hypertension 12/16/2017  . 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 Hyperlipidemia 12/16/2017  . Osteoporosis 12/16/2017  . Vitamin B12 deficiency 12/16/2017  . Depression 12/16/2017    Past  Surgical History:  Procedure Laterality Date  . ABDOMINAL HYSTERECTOMY    . CHOLECYSTECTOMY    . KNEE SURGERY     total     OB History    Gravida  2   Para  2   Term  2   Preterm      AB      Living        SAB      TAB      Ectopic      Multiple      Live Births               Home Medications    Prior to Admission medications   Medication Sig Start Date End Date Taking? Authorizing Provider  acetaminophen (TYLENOL) 325 MG tablet Take 650 mg by mouth every 6 (six) hours as needed.    [provider]  amLODipine (NORVASC) 10 MG tablet Take 10 mg by mouth daily.    [provider]  aspirin (ASPIRIN EC) 81 MG EC tablet Take 81 mg by mouth daily. Swallow whole.     [provider]  bisacodyl (DULCOLAX) 10 MG suppository Place 10 mg rectally daily as needed for moderate constipation (if not relieved by MOM).     [provider]  bisacodyl (FLEET) 10 MG/30ML ENEM Place 10 mg rectally daily as needed (if not relieved by Bisacodyl suppository).  [provider]  calcium carbonate (OS-CAL) 1250 (500 Ca) MG chewable tablet Chew 1 tablet by mouth daily.    [provider]  Cholecalciferol (D3-1000) 1000 units tablet Take 1,000 Units by mouth daily.    [provider]  Dextromethorphan-quiNIDine (NUEDEXTA) 20-10 MG CAPS Take 1 capsule by mouth daily.     [provider]  hydrALAZINE (APRESOLINE) 50 MG tablet Take 50 mg by mouth 2 (two) times daily.    [provider]  levETIRAcetam (KEPPRA) 100 MG/ML solution Take 5 mLs (500 mg total) by mouth 2 (two) times daily. 02/14/18   Shirley, [place]az[place], DO  Magnesium Hydroxide (MILK OF MAGNESIA PO) Take 30 mLs by mouth daily as needed (if no BM in 3 days).     [provider]  memantine (NAMENDA XR) 28 MG CP24 24 hr capsule Take 28 mg by mouth at bedtime.     [provider]  NUTRITIONAL SUPPLEMENTS PO Initiate 120 ml Medpass 3 times daily  with meals related to weight loss    [provider]  Omega-3 Fatty Acids (FISH OIL PO) Take by mouth. 1,600 MG/5 ML LIQUID- take 5 ml po daily as supplement    [provider]  polyethylene glycol (MIRALAX / GLYCOLAX) packet Take 17 g by mouth daily.    [provider]  pravastatin (PRAVACHOL) 40 MG tablet Take 40 mg by mouth daily.    [provider]  rivastigmine (EXELON) 6 MG capsule Take 6 mg by mouth 2 (two) times daily.     [provider]  vitamin B-12 (CYANOCOBALAMIN) 1000 MCG tablet Take 1,000 mcg by mouth daily.    [provider]    Family History Family History  Problem Relation Age of Onset  . Hypertension Mother   . Peripheral vascular disease Mother        Bilateral leg amputations  . Alzheimer's disease Father   . Stroke Brother   . Heart attack Brother     Social History Social History   Tobacco Use  . Smoking status: Former Games developermoker  . Smokeless tobacco: Never Used  Substance Use Topics  . Alcohol use: No  . Drug use: No     Allergies   Penicillins   Review of Systems Review of Systems  Unable to perform ROS: Dementia     Physical Exam Updated Vital Signs There were no vitals taken for this visit.  Physical Exam Vitals signs and nursing note reviewed.  Constitutional:      Appearance: She is well-developed.     Comments: awake  HENT:     Head: Normocephalic and atraumatic.  Eyes:     Conjunctiva/sclera: Conjunctivae normal.     Pupils: Pupils are equal, round, and reactive to light.  Neck:     Musculoskeletal: Normal range of motion and neck supple.  Cardiovascular:     Rate and Rhythm: Normal rate and regular rhythm.     Heart sounds: No murmur. No friction rub. No gallop.   Pulmonary:     Effort: Pulmonary effort is normal. No respiratory distress.     Breath sounds: Normal breath sounds. No wheezing or rales.  Chest:     Chest wall: No tenderness.  Abdominal:     General: Bowel  sounds are normal. There is no distension.     Palpations: Abdomen is soft. There is no mass.     Tenderness: There is abdominal tenderness. There is no guarding or rebound.  Musculoskeletal: Normal range of  motion.        General: No tenderness.  Skin:    General: Skin is warm and dry.  Psychiatric:        Behavior: Behavior normal.        Thought Content: Thought content normal.        Judgment: Judgment normal.      ED Treatments / Results  Labs (all labs ordered are listed, but only abnormal results are displayed) Labs Reviewed  COMPREHENSIVE METABOLIC PANEL  CBC  LIPASE, BLOOD  URINALYSIS, ROUTINE W REFLEX MICROSCOPIC    EKG None  Radiology No results found.  Procedures Procedures (including critical care time)  Medications Ordered in ED Medications - No data to display   Initial Impression / Assessment and Plan / ED Course  I have reviewed the triage vital signs and the nursing notes.  Pertinent labs & imaging results that were available during my care of the patient were reviewed by me and considered in my medical decision making (see chart for details).        Patient with episode of vomiting at her assisted living facility tonight.  Will check labs.  Will check CT given lower abdominal tenderness.  CT shows multilobar pneumonia, I suspect this is likely aspiration.  12:06 AM Called to bedside, Dr. Elesa Massed already present, patient had seizure like episode. Pt. Has hx of the same.  Will check CBG and give loading dose of keppra.  Case discussed with Dr. Wilford Corner from neurology, who recommends continuing Keppra 500 mg twice daily.  States patient has had seizures in the past when she has been fighting infection, and believes that her seizure threshold has been lowered due to the infection.  No changes in management per neurology.  Appreciate Dr. Toniann Fail for admitting the patient.  Patient started on Levaquin.  She has penicillin allergy.  She was also given  1 g of Keppra.  Final Clinical Impressions(s) / ED Diagnoses   Final diagnoses:  Multifocal pneumonia    ED Discharge Orders    None       Roxy Horseman, PA-C 12/27/18 0531    Benjiman Core, MD 12/28/18 2352

## 2018-12-26 NOTE — ED Triage Notes (Addendum)
EMS called out to [facility] for "projectile vomiting" x1 after dinner  Pt ate meatloaf before vomiting. Non verbal at baseline with dementia. At baseline per staff at this time.  VSS. NSR. No vomiting with EMS.

## 2018-12-27 ENCOUNTER — Emergency Department (HOSPITAL_COMMUNITY): Payer: Medicare Other

## 2018-12-27 ENCOUNTER — Encounter (HOSPITAL_COMMUNITY): Payer: Self-pay | Admitting: Internal Medicine

## 2018-12-27 DIAGNOSIS — R1112 Projectile vomiting: Secondary | ICD-10-CM

## 2018-12-27 DIAGNOSIS — I10 Essential (primary) hypertension: Secondary | ICD-10-CM

## 2018-12-27 DIAGNOSIS — F0390 Unspecified dementia without behavioral disturbance: Secondary | ICD-10-CM

## 2018-12-27 DIAGNOSIS — J189 Pneumonia, unspecified organism: Secondary | ICD-10-CM | POA: Diagnosis not present

## 2018-12-27 DIAGNOSIS — R569 Unspecified convulsions: Secondary | ICD-10-CM

## 2018-12-27 DIAGNOSIS — R1110 Vomiting, unspecified: Secondary | ICD-10-CM | POA: Diagnosis present

## 2018-12-27 DIAGNOSIS — K5909 Other constipation: Secondary | ICD-10-CM

## 2018-12-27 DIAGNOSIS — E870 Hyperosmolality and hypernatremia: Secondary | ICD-10-CM

## 2018-12-27 DIAGNOSIS — K7689 Other specified diseases of liver: Secondary | ICD-10-CM | POA: Diagnosis not present

## 2018-12-27 LAB — COMPREHENSIVE METABOLIC PANEL
ALT: 12 U/L (ref 0–44)
AST: 20 U/L (ref 15–41)
Albumin: 3.5 g/dL (ref 3.5–5.0)
Alkaline Phosphatase: 78 U/L (ref 38–126)
Anion gap: 10 (ref 5–15)
BUN: 22 mg/dL (ref 8–23)
CHLORIDE: 109 mmol/L (ref 98–111)
CO2: 27 mmol/L (ref 22–32)
Calcium: 9.5 mg/dL (ref 8.9–10.3)
Creatinine, Ser: 0.74 mg/dL (ref 0.44–1.00)
GFR calc Af Amer: >60 mL/min (ref >60)
GFR calc non Af Amer: >60 mL/min (ref >60)
Glucose, Bld: 122 mg/dL — ABNORMAL HIGH (ref 70–99)
POTASSIUM: 3.6 mmol/L (ref 3.5–5.1)
Sodium: 146 mmol/L — ABNORMAL HIGH (ref 135–145)
Total Bilirubin: 0.5 mg/dL (ref 0.3–1.2)
Total Protein: 6.9 g/dL (ref 6.5–8.1)

## 2018-12-27 LAB — URINALYSIS, ROUTINE W REFLEX MICROSCOPIC
Bilirubin Urine: NEGATIVE
Glucose, UA: NEGATIVE mg/dL
HGB URINE DIPSTICK: NEGATIVE
Ketones, ur: NEGATIVE mg/dL
NITRITE: NEGATIVE
Protein, ur: NEGATIVE mg/dL
Specific Gravity, Urine: 1.042 — ABNORMAL HIGH (ref 1.005–1.030)
pH: 5 (ref 5.0–8.0)

## 2018-12-27 LAB — LIPASE, BLOOD: Lipase: 36 U/L (ref 11–51)

## 2018-12-27 LAB — PROCALCITONIN: Procalcitonin: 0.6 ng/mL

## 2018-12-27 LAB — MRSA PCR SCREENING: MRSA by PCR: NEGATIVE

## 2018-12-27 LAB — CBG MONITORING, ED: Glucose-Capillary: 122 mg/dL — ABNORMAL HIGH (ref 70–99)

## 2018-12-27 MED ORDER — ENOXAPARIN SODIUM 40 MG/0.4ML ~~LOC~~ SOLN
40.0000 mg | SUBCUTANEOUS | Status: DC
  Administered 2018-12-27 – 2018-12-28 (×2): 40 mg via SUBCUTANEOUS
  Filled 2018-12-27 (×2): qty 0.4

## 2018-12-27 MED ORDER — IOHEXOL 300 MG/ML  SOLN
100.0000 mL | Freq: Once | INTRAMUSCULAR | Status: AC | PRN
  Administered 2018-12-27: 100 mL via INTRAVENOUS

## 2018-12-27 MED ORDER — LEVETIRACETAM IN NACL 500 MG/100ML IV SOLN
500.0000 mg | Freq: Two times a day (BID) | INTRAVENOUS | Status: DC
  Administered 2018-12-27 – 2018-12-29 (×4): 500 mg via INTRAVENOUS
  Filled 2018-12-27 (×5): qty 100

## 2018-12-27 MED ORDER — ONDANSETRON HCL 4 MG/2ML IJ SOLN: 4.0000 mg | Freq: Four times a day (QID) | INTRAMUSCULAR | Status: DC | PRN

## 2018-12-27 MED ORDER — ACETAMINOPHEN 325 MG PO TABS: 650.0000 mg | ORAL_TABLET | Freq: Four times a day (QID) | ORAL | Status: DC | PRN

## 2018-12-27 MED ORDER — LEVETIRACETAM IN NACL 1000 MG/100ML IV SOLN
1000.0000 mg | Freq: Once | INTRAVENOUS | Status: AC
  Administered 2018-12-27: 1000 mg via INTRAVENOUS
  Filled 2018-12-27: qty 100

## 2018-12-27 MED ORDER — HYDRALAZINE HCL 50 MG PO TABS
50.0000 mg | ORAL_TABLET | Freq: Two times a day (BID) | ORAL | Status: DC
  Administered 2018-12-27 – 2018-12-29 (×4): 50 mg via ORAL
  Filled 2018-12-27 (×4): qty 1

## 2018-12-27 MED ORDER — LEVETIRACETAM 100 MG/ML PO SOLN
500.0000 mg | Freq: Two times a day (BID) | ORAL | Status: DC
  Filled 2018-12-27: qty 5

## 2018-12-27 MED ORDER — BISACODYL 10 MG RE SUPP: 10.0000 mg | Freq: Every day | RECTAL | Status: DC | PRN

## 2018-12-27 MED ORDER — PRAVASTATIN SODIUM 40 MG PO TABS
40.0000 mg | ORAL_TABLET | Freq: Every day | ORAL | Status: DC
  Administered 2018-12-28 – 2018-12-29 (×2): 40 mg via ORAL
  Filled 2018-12-27 (×2): qty 1

## 2018-12-27 MED ORDER — SODIUM CHLORIDE 0.9 % IV SOLN
1.0000 g | INTRAVENOUS | Status: DC
  Administered 2018-12-27 – 2018-12-29 (×3): 1 g via INTRAVENOUS
  Filled 2018-12-27 (×3): qty 10

## 2018-12-27 MED ORDER — RIVASTIGMINE TARTRATE 1.5 MG PO CAPS: 6.0000 mg | ORAL_CAPSULE | Freq: Two times a day (BID) | ORAL | Status: DC

## 2018-12-27 MED ORDER — LORAZEPAM 2 MG/ML IJ SOLN
INTRAMUSCULAR | Status: AC
  Filled 2018-12-27: qty 1

## 2018-12-27 MED ORDER — LEVOFLOXACIN IN D5W 750 MG/150ML IV SOLN
750.0000 mg | Freq: Once | INTRAVENOUS | Status: AC
  Administered 2018-12-27: 750 mg via INTRAVENOUS
  Filled 2018-12-27: qty 150

## 2018-12-27 MED ORDER — BISACODYL 10 MG/30ML RE ENEM
10.0000 mg | ENEMA | Freq: Every day | RECTAL | Status: DC | PRN
  Filled 2018-12-27: qty 30

## 2018-12-27 MED ORDER — MEMANTINE HCL ER 28 MG PO CP24: 28.0000 mg | ORAL_CAPSULE | Freq: Every day | ORAL | Status: DC

## 2018-12-27 MED ORDER — ACETAMINOPHEN 650 MG RE SUPP: 650.0000 mg | Freq: Four times a day (QID) | RECTAL | Status: DC | PRN

## 2018-12-27 MED ORDER — AMLODIPINE BESYLATE 10 MG PO TABS
10.0000 mg | ORAL_TABLET | Freq: Every day | ORAL | Status: DC
  Administered 2018-12-28 – 2018-12-29 (×2): 10 mg via ORAL
  Filled 2018-12-27 (×2): qty 1

## 2018-12-27 MED ORDER — LACTATED RINGERS IV SOLN
INTRAVENOUS | Status: DC
  Administered 2018-12-27 – 2018-12-28 (×3): via INTRAVENOUS

## 2018-12-27 MED ORDER — DEXTROMETHORPHAN-QUINIDINE 20-10 MG PO CAPS
1.0000 | ORAL_CAPSULE | Freq: Every day | ORAL | Status: DC
  Administered 2018-12-28 – 2018-12-29 (×2): 1 via ORAL
  Filled 2018-12-27 (×2): qty 1

## 2018-12-27 MED ORDER — ASPIRIN EC 81 MG PO TBEC
81.0000 mg | DELAYED_RELEASE_TABLET | Freq: Every day | ORAL | Status: DC
  Administered 2018-12-28 – 2018-12-29 (×2): 81 mg via ORAL
  Filled 2018-12-27 (×2): qty 1

## 2018-12-27 MED ORDER — POLYETHYLENE GLYCOL 3350 17 G PO PACK
17.0000 g | PACK | Freq: Every day | ORAL | Status: DC
  Administered 2018-12-28: 17 g via ORAL
  Filled 2018-12-27: qty 1

## 2018-12-27 MED ORDER — SODIUM CHLORIDE 0.9 % IV SOLN: INTRAVENOUS | Status: DC

## 2018-12-27 MED ORDER — HYDRALAZINE HCL 20 MG/ML IJ SOLN
5.0000 mg | INTRAMUSCULAR | Status: DC | PRN
  Administered 2018-12-28 – 2018-12-29 (×4): 5 mg via INTRAVENOUS
  Filled 2018-12-27 (×5): qty 1

## 2018-12-27 MED ORDER — ONDANSETRON HCL 4 MG PO TABS: 4.0000 mg | ORAL_TABLET | Freq: Four times a day (QID) | ORAL | Status: DC | PRN

## 2018-12-27 NOTE — ED Notes (Signed)
ED TO INPATIENT HANDOFF REPORT  ED Nurse Name and Phone #: [phone]   S Name/Age/Gender Melissa Jarvis 78 y.o. female Room/Bed: 019C/019C  Code Status   Code Status: Full Code  Home/SNF/Other Skilled nursing facility Disoriented x4 Is this baseline? Yes   Triage Complete: Triage complete  Chief Complaint N/V  Triage Note EMS called out to [facility] for "projectile vomiting" x1 after dinner  Pt ate meatloaf before vomiting. Non verbal at baseline with dementia. At baseline per staff at this time.  VSS. NSR. No vomiting with EMS.     Allergies Allergies  Allergen Reactions  . Penicillins Other (See Comments)    Tolerated Ceftriaxone 04/2018 Other reaction(s): UNKNOWN REACTION      Level of Care/Admitting Diagnosis ED Disposition    ED Disposition Condition Comment   Admit  Hospital Area: [facility] [[id number]]  Level of Care: Med-Surg [16]  I expect the patient will be discharged within 24 hours: Yes  LOW acuity---Tx typically complete <24 hrs---ACUTE conditions typically can be evaluated <24 hours---LABS likely to return to acceptable levels <24 hours---IS near functional baseline---EXPECTED to return to current living arrangement---NOT newly hypoxic: Meets criteria for 5C-Observation unit  Diagnosis: Emesis [[id number]]  Admitting Physician: Jonah Blue [[id number]]  Attending Physician: Jonah Blue [[id number]]  PT Class (Do Not Modify): Observation [104]  PT Acc Code (Do Not Modify): Observation [10022]       B Medical/Surgery History Past Medical History:  Diagnosis Date  . Alzheimer's disease (HCC)   . Chronic constipation   . Depressed   . Failure to thrive in adult   . HCAP (healthcare-associated pneumonia)   . Hyperlipidemia   . Hypernatremia   . Hypertension   . Hypokalemia   . Low back pain   . Migraines   . Osteoarthritis   . Osteoporosis   . PBA (pseudobulbar affect)   . Post-ictal state (HCC)   . Seizures (HCC)   . Urinary  tract infection with hematuria   . Vitamin B12 deficiency    Past Surgical History:  Procedure Laterality Date  . ABDOMINAL HYSTERECTOMY    . CHOLECYSTECTOMY    . KNEE SURGERY     total     A IV Location/Drains/Wounds Patient Lines/Drains/Airways Status   Active Line/Drains/Airways    Name:   Placement date:   Placement time:   Site:   Days:   Peripheral IV 12/26/18 Right;Lateral Forearm   12/26/18    2333    Forearm   1   Wound / Incision (Open or Dehisced) 12/16/14 Laceration Head Left;Posterior 1" lac   12/16/14    1120    Head   1472          Intake/Output Last 24 hours No intake or output data in the 24 hours ending 12/27/18 1101  Labs/Imaging Results for orders placed or performed during the hospital encounter of 12/26/18 (from the past 48 hour(s))  Comprehensive metabolic panel     Status: Abnormal   Collection Time: 12/26/18 11:32 PM  Result Value Ref Range   Sodium 146 (H) 135 - 145 mmol/L   Potassium 3.6 3.5 - 5.1 mmol/L   Chloride 109 98 - 111 mmol/L   CO2 27 22 - 32 mmol/L   Glucose, Bld 122 (H) 70 - 99 mg/dL   BUN 22 8 - 23 mg/dL   Creatinine, Ser 1.19 0.44 - 1.00 mg/dL   Calcium 9.5 8.9 - 14.7 mg/dL   Total Protein 6.9 6.5 -  8.1 g/dL   Albumin 3.5 3.5 - 5.0 g/dL   AST 20 15 - 41 U/L   ALT 12 0 - 44 U/L   Alkaline Phosphatase 78 38 - 126 U/L   Total Bilirubin 0.5 0.3 - 1.2 mg/dL   GFR calc non Af Amer >60 >60 mL/min   GFR calc Af Amer >60 >60 mL/min   Anion gap 10 5 - 15    Comment: Performed at [facility], [address] [place], [place] [address]  CBC     Status: Abnormal   Collection Time: 12/26/18 11:32 PM  Result Value Ref Range   WBC 4.3 4.0 - 10.5 K/uL   RBC 4.62 3.87 - 5.11 MIL/uL   Hemoglobin 14.3 12.0 - 15.0 g/dL   HCT 41.3 24.4 - 01.0 %   MCV 98.5 80.0 - 100.0 fL   MCH 31.0 26.0 - 34.0 pg   MCHC 31.4 30.0 - 36.0 g/dL   RDW 27.2 53.6 - 64.4 %   Platelets 133 (L) 150 - 400 K/uL    Comment: REPEATED TO VERIFY   nRBC 0.0 0.0 -  0.2 %    Comment: Performed at [facility], [address] [place], [place] [address]  Lipase, blood     Status: None   Collection Time: 12/26/18 11:32 PM  Result Value Ref Range   Lipase 36 11 - 51 U/L    Comment: Performed at [facility], [address], [place], [place] [address]  CBG monitoring, ED     Status: Abnormal   Collection Time: 12/27/18 12:08 AM  Result Value Ref Range   Glucose-Capillary 122 (H) 70 - 99 mg/dL  Urinalysis, Routine w reflex microscopic     Status: Abnormal   Collection Time: 12/27/18  5:57 AM  Result Value Ref Range   Color, Urine YELLOW YELLOW   APPearance HAZY (A) CLEAR   Specific Gravity, Urine 1.042 (H) 1.005 - 1.030   pH 5.0 5.0 - 8.0   Glucose, UA NEGATIVE NEGATIVE mg/dL   Hgb urine dipstick NEGATIVE NEGATIVE   Bilirubin Urine NEGATIVE NEGATIVE   Ketones, ur NEGATIVE NEGATIVE mg/dL   Protein, ur NEGATIVE NEGATIVE mg/dL   Nitrite NEGATIVE NEGATIVE   Leukocytes,Ua SMALL (A) NEGATIVE   RBC / HPF 0-5 0 - 5 RBC/hpf   WBC, UA 11-20 0 - 5 WBC/hpf   Bacteria, UA MANY (A) NONE SEEN   Squamous Epithelial / LPF 0-5 0 - 5   Mucus PRESENT     Comment: Performed at [facility], [address] [place], [place] [address]   Ct Abdomen Pelvis W Contrast  Result Date: 12/27/2018 CLINICAL DATA:  Failure to thrive. Projectile vomiting after eating. EXAM: CT ABDOMEN AND PELVIS WITH CONTRAST TECHNIQUE: Multidetector CT imaging of the abdomen and pelvis was performed using the standard protocol following bolus administration of intravenous contrast. CONTRAST:  OMNIPAQUE IOHEXOL 300 MG/ML  SOLN COMPARISON:  Report from 01/17/2010 CT FINDINGS: Lower chest: Normal heart size without pericardial effusion or thickening. Patchy bibasilar airspace disease consistent with multilobar pneumonia. No effusion or pneumothorax. Hepatobiliary: Tiny subcentimeter scattered hypodensities are noted within the liver compatible with cysts, biliary hamartomas or  small hemangiomas. Status post cholecystectomy. No choledocholithiasis. Reservoir effect with intrahepatic and extrahepatic ductal dilatation likely on the basis of prior cholecystectomy. Pancreas: No ductal dilatation, mass or inflammation. Spleen: Normal Adrenals/Urinary Tract: Thickening of the adrenal glands. Small cystic nodule of the right adrenal gland measuring  7 mm possibly a small adenoma but is too small to further characterize. Cortical scarring within both kidneys subcentimeter cyst associated with the lower pole the right kidney and interpolar left kidney. No nephrolithiasis nor obstructive uropathy. The urinary bladder is decompressed. Stomach/Bowel: The stomach is physiologically distended without mucosal thickening. Normal small bowel rotation without obstruction. Significant stool retention throughout the colon consistent constipation with probable fecal impaction in the rectum. Unremarkable appendix. Vascular/Lymphatic: Moderate aortoiliac atherosclerosis without aneurysm. No lymphadenopathy. Reproductive: Status post hysterectomy. No adnexal masses. Other: No free air free fluid.  Mild soft tissue anasarca. Musculoskeletal: Mild grade 1 anterolisthesis of L4 on L5 likely on the basis of degenerative disc disease and facet arthropathy. Vacuum disc phenomenon noted at L3-4 and L4-5. No acute fracture or aggressive osseous lesions. IMPRESSION: 1. Patchy multilobar airspace disease at the lung bases consistent multilobar pneumonia. Stigmata of CHF believed less likely given lack of significant pleural effusion or cardiomegaly. 2. Significant stool retention throughout the colon consistent constipation with probable fecal impaction. 3. Tiny too small to characterize cysts of the liver and both kidneys. Electronically Signed   By: Tollie Eth M.D.   On: 12/27/2018 01:02    Pending Labs Unresulted Labs (From admission, onward)    Start     Ordered   12/28/18 0500  Basic metabolic panel  Tomorrow  morning,   R     12/27/18 1052   12/28/18 0500  CBC  Tomorrow morning,   R     12/27/18 1052          Vitals/Pain Today's Vitals   12/27/18 0945 12/27/18 1000 12/27/18 1015 12/27/18 1030  BP: (!) 148/81 (!) 155/76 (!) 166/84 (!) 164/82  Pulse: 89 93 87 91  Resp: 17 (!) 21 16 16   Temp:      TempSrc:      SpO2: 100% 98% 98% 100%    Isolation Precautions No active isolations  Medications Medications  LORazepam (ATIVAN) 2 MG/ML injection (has no administration in time range)  aspirin EC tablet 81 mg (has no administration in time range)  amLODipine (NORVASC) tablet 10 mg (has no administration in time range)  hydrALAZINE (APRESOLINE) tablet 50 mg (has no administration in time range)  pravastatin (PRAVACHOL) tablet 40 mg (has no administration in time range)  Dextromethorphan-quiNIDine (NUEDEXTA) 20-10 MG per capsule 1 capsule (has no administration in time range)  memantine (NAMENDA XR) 24 hr capsule 28 mg (has no administration in time range)  rivastigmine (EXELON) capsule 6 mg (has no administration in time range)  bisacodyl (DULCOLAX) suppository 10 mg (has no administration in time range)  bisacodyl (FLEET) enema 10 mg (has no administration in time range)  polyethylene glycol (MIRALAX / GLYCOLAX) packet 17 g (has no administration in time range)  levETIRAcetam (KEPPRA) 100 MG/ML solution 500 mg (has no administration in time range)  enoxaparin (LOVENOX) injection 40 mg (has no administration in time range)  acetaminophen (TYLENOL) tablet 650 mg (has no administration in time range)    Or  acetaminophen (TYLENOL) suppository 650 mg (has no administration in time range)  ondansetron (ZOFRAN) tablet 4 mg (has no administration in time range)    Or  ondansetron (ZOFRAN) injection 4 mg (has no administration in time range)  0.9 %  sodium chloride infusion (has no administration in time range)  levETIRAcetam (KEPPRA) IVPB 1000 mg/100 mL premix (0 mg Intravenous Stopped  12/27/18 0130)  iohexol (OMNIPAQUE) 300 MG/ML solution 100 mL (100 mLs Intravenous Contrast Given 12/27/18 0022)  levofloxacin (LEVAQUIN) IVPB 750 mg (0 mg Intravenous Stopped 12/27/18 0430)    Mobility non-ambulatory Moderate fall risk   Focused Assessments Neuro Assessment Handoff:  Swallow screen pass? No          Neuro Assessment: Exceptions to WDL Neuro Checks:      Last Documented NIHSS Modified Score:   Has TPA been given? No If patient is a Neuro Trauma and patient is going to OR before floor call report to 4N Charge nurse: [phone] or [phone]     R Recommendations: See Admitting Provider Note  Report given to:   Additional Notes:  Constipation, Aspiration pneumonia, currently at baseline, Full Code

## 2018-12-27 NOTE — ED Notes (Signed)
Called to pt room for seizure like activity. Pt given 1 mg ativan per EDP verbal order at bedside during Epic downtime.

## 2018-12-27 NOTE — Evaluation (Signed)
Clinical/Bedside Swallow Evaluation Patient Details  Name: Melissa Jarvis MRN: [id number] Date of Birth: 1940/12/26  Today's Date: 12/27/2018 Time: SLP Start Time (ACUTE ONLY): 1437 SLP Stop Time (ACUTE ONLY): 1457 SLP Time Calculation (min) (ACUTE ONLY): 20 min  Past Medical History:  Past Medical History:  Diagnosis Date  . Alzheimer's disease (HCC)   . Chronic constipation   . Depressed   . Failure to thrive in adult   . HCAP (healthcare-associated pneumonia)   . Hyperlipidemia   . Hypernatremia   . Hypertension   . Hypokalemia   . Low back pain   . Migraines   . Osteoarthritis   . Osteoporosis   . PBA (pseudobulbar affect)   . Post-ictal state (HCC)   . Seizures (HCC)   . Urinary tract infection with hematuria   . Vitamin B12 deficiency    Past Surgical History:  Past Surgical History:  Procedure Laterality Date  . ABDOMINAL HYSTERECTOMY    . CHOLECYSTECTOMY    . KNEE SURGERY     total   HPI:  Melissa Jarvis is a 78 y.o. 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  Patient apparently had 1 episode of emesis at her facility and was sent to the ER for further evaluation.  At baseline, she is non-verbal and bedbound. CT 3/12 revealed "Patchy multilobar airspace disease at the lung bases consistent multilobar pneumonia. Stigmata of CHF believed less likely given lack of significant pleural effusion or cardiomegaly."   Assessment / Plan / Recommendation Clinical Impression  Pt presents with clinical indicators of pharyngeal dysphagia.  With ice chips and thin liquid, suspect premature spillage prior to swallow.  Wet vocal quality and throat clearing noted.  Pt required multiple swallows.  With nectar thick liquid, mulitiple swallows persisted, but pt did not exhibit wet cough.  Pt tolerated puree with no overt s/s of aspiration and good oral clearance.  With regular solid there was  decreased oral acceptance and non-rotary frontal munching pattern.  Diffuse mild oral residue was noted.  Pt was seen by this service during admission in July of 2019.  Presentation at that time was fairly consistent with this admission as pt "was found to be confused and throwing up after her dinner last night.  Patient was brought to the ER for further workup, found to have pneumonia".  Pt was evaluated and managed clinically with recommendations for full liquid diet.  Daughter reports that pt consumes regular texture diet with full range of liquid at baseline.   Recommend initiating puree diet with nectar thick liquids.  Pt would benefit from MBSS to further assess pharyngeal swallow function and inform advance care planning.  SLP Visit Diagnosis: Dysphagia, oropharyngeal phase (R13.12)    Aspiration Risk  Mild aspiration risk    Diet Recommendation Dysphagia 1 (Puree);Nectar-thick liquid   Liquid Administration via: Cup Medication Administration: Crushed with puree Supervision: Full supervision/cueing for compensatory strategies Compensations: Minimize environmental distractions;Slow rate;Small sips/bites Postural Changes: Seated upright at 90 degrees    Other  Recommendations Oral Care Recommendations: Oral care BID   Follow up Recommendations   pending results of MBSS     Frequency and Duration   pending results of MBSS         Prognosis Prognosis for Safe Diet Advancement: Fair Barriers to Reach Goals: Cognitive deficits      Swallow Study   General HPI: Melissa Jarvis is a 78 y.o. female  with medical history significant of dementia (non-verbal at baseline); seizure d/o; HTN; and HLD presenting with vomiting.  Patient apparently had 1 episode of emesis at her facility and was sent to the ER for further evaluation.  At baseline, she is non-verbal and bedbound. CT 3/12 revealed "Patchy multilobar airspace disease at the lung bases consistent multilobar pneumonia. Stigmata of  CHF believed less likely given lack of significant pleural effusion or cardiomegaly." Type of Study: Bedside Swallow Evaluation Previous Swallow Assessment: Clinical assessment in July of 2019 Diet Prior to this Study: NPO Temperature Spikes Noted: No Respiratory Status: Nasal cannula History of Recent Intubation: No Behavior/Cognition: Confused;Lethargic/Drowsy;Doesn't follow directions Oral Cavity Assessment: Within Functional Limits Oral Cavity - Dentition: Missing dentition Self-Feeding Abilities: Total assist Patient Positioning: Upright in bed Baseline Vocal Quality: Not observed Volitional Cough: Cognitively unable to elicit Volitional Swallow: Unable to elicit    Oral/Motor/Sensory Function Overall Oral Motor/Sensory Function: (Unable to assess 2/2 cognitive impairments)   Ice Chips Ice chips: Impaired Pharyngeal Phase Impairments: Suspected delayed Swallow;Cough - Immediate   Thin Liquid Thin Liquid: Impaired Presentation: Cup;Straw Pharyngeal  Phase Impairments: Multiple swallows;Throat Clearing - Immediate;Wet Vocal Quality    Nectar Thick Nectar Thick Liquid: Impaired Pharyngeal Phase Impairments: Multiple swallows   Honey Thick Honey Thick Liquid: Not tested   Puree Puree: Within functional limits Presentation: Spoon   Solid     Solid: Impaired Oral Phase Impairments: Reduced labial seal;Impaired mastication Oral Phase Functional Implications: Impaired mastication;Oral residue      Kerrie Pleasure, MA, CCC-SLP Acute Rehabilitation Services Office: [phone]; Pager (3/12): [phone] 12/27/2018,3:22 PM

## 2018-12-27 NOTE — ED Notes (Signed)
Called to pt room by daughter d/t pt having seizure like activity. EDP at bedside. Pt placed on oxygen. Seizure lasted approximately 60 seconds.

## 2018-12-27 NOTE — H&P (Signed)
History and Physical    Melissa Jarvis XBM:[id number] DOB: [birth year] DOA: 12/26/2018  PCP: Melissa Hanks, MD Consultants:  None Patient coming from: [facility]; NOK: Melissa Jarvis, daughter, [phone]   Chief Complaint: Vomiting  HPI: Melissa Jarvis is a 78 y.o. 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  Patient apparently had 1 episode of emesis at her facility and was sent to the ER for further evaluation.  At baseline, she is non-verbal and bedbound.  She was unable to provide history and was unaccompanied.  I called her daughter, who was present in the ER with her all night, and little additional information was available.   ED Course:  Carryover, per Dr. Toniann Fail:  78 year old female with history of dementia was brought to the ER patient was complaining of abdominal pain. CT of the abdomen does not show any acute does show some pneumonia which could be from aspiration. Patient does have a known history of seizure had 2 witnessed seizures back-to-back in the ER. Keppra 1 g was ordered. At this time neurologist feel patient seizure could have been provoked by the infection admitted for observation.  Review of Systems: Unable to perform   Ambulatory Status:  Non-ambulatory  Past Medical History:  Diagnosis Date   Alzheimer's disease (HCC)    Chronic constipation    Depressed    Failure to thrive in adult    HCAP (healthcare-associated pneumonia)    Hyperlipidemia    Hypernatremia    Hypertension    Hypokalemia    Low back pain    Migraines    Osteoarthritis    Osteoporosis    PBA (pseudobulbar affect)    Post-ictal state (HCC)    Seizures (HCC)    Urinary tract infection with hematuria    Vitamin B12 deficiency     Past Surgical History:  Procedure Laterality Date   ABDOMINAL HYSTERECTOMY     CHOLECYSTECTOMY     KNEE SURGERY     total     Social History   Socioeconomic History   Marital status: Widowed    Spouse name: Not on file   Number of children: Not on file   Years of education: Not on file   Highest education level: Not on file  Occupational History   Not on file  Social Needs   Financial resource strain: Not on file   Food insecurity:    Worry: Not on file    Inability: Not on file   Transportation needs:    Medical: Not on file    Non-medical: Not on file  Tobacco Use   Smoking status: Former Smoker   Smokeless tobacco: Never Used  Substance and Sexual Activity   Alcohol use: No   Drug use: No   Sexual activity: Not on file  Lifestyle   Physical activity:    Days per week: Not on file    Minutes per session: Not on file   Stress: Not on file  Relationships   Social connections:    Talks on phone: Not on file    Gets together: Not on file    Attends religious service: Not on file    Active member of club or organization: Not on file    Attends meetings of clubs or organizations: Not on file    Relationship status: Not on file   Intimate partner violence:    Fear of current or ex partner: Not  on file    Emotionally abused: Not on file    Physically abused: Not on file    Forced sexual activity: Not on file  Other Topics Concern   Not on file  Social History Narrative          Allergies  Allergen Reactions   Penicillins Other (See Comments)    Tolerated Ceftriaxone 04/2018 Other reaction(s): UNKNOWN REACTION      Family History  Problem Relation Age of Onset   Hypertension Mother    Peripheral vascular disease Mother        Bilateral leg amputations   Alzheimer's disease Father    Stroke Brother    Heart attack Brother     Prior to Admission medications   Medication Sig Start Date End Date Taking? Authorizing Provider  acetaminophen (TYLENOL) 325 MG tablet Take 650 mg by mouth every 6 (six) hours as needed for mild pain.    Yes [provider]  amLODipine (NORVASC) 10 MG tablet Take 10 mg by mouth daily.   Yes [provider]  aspirin (ASPIRIN EC) 81 MG EC tablet Take 81 mg by mouth daily. Swallow whole.    Yes [provider]  bisacodyl (DULCOLAX) 10 MG suppository Place 10 mg rectally daily as needed for moderate constipation (if not relieved by MOM).    Yes [provider]  bisacodyl (FLEET) 10 MG/30ML ENEM Place 10 mg rectally daily as needed (if not relieved by Bisacodyl suppository).    Yes [provider]  calcium carbonate (OS-CAL) 1250 (500 Ca) MG chewable tablet Chew 1 tablet by mouth daily.   Yes [provider]  Cholecalciferol (D3-1000) 1000 units tablet Take 1,000 Units by mouth daily.   Yes [provider]  Dextromethorphan-quiNIDine (NUEDEXTA) 20-10 MG CAPS Take 1 capsule by mouth daily.    Yes [provider]  feeding supplement, ENSURE ENLIVE, (ENSURE ENLIVE) LIQD Take 237 mLs by mouth 4 (four) times daily.   Yes [provider]  hydrALAZINE (APRESOLINE) 50 MG tablet Take 50 mg by mouth 2 (two) times daily.   Yes [provider]  levETIRAcetam (KEPPRA) 100 MG/ML solution Take 5 mLs (500 mg total) by mouth 2 (two) times daily. 02/14/18  Yes Talbert Forest, [place], DO  Magnesium Hydroxide (MILK OF MAGNESIA PO) Take 30 mLs by mouth daily as needed (if no BM in 3 days).    Yes [provider]  memantine (NAMENDA XR) 28 MG CP24 24 hr capsule Take 28 mg by mouth at bedtime.    Yes [provider]  NUTRITIONAL SUPPLEMENTS PO Initiate 120 ml Medpass 3 times daily with meals related to weight loss   Yes [provider]  Omega-3 Fatty Acids (FISH OIL PO) Take 5 mLs by mouth daily. 1,600 MG/5 ML LIQUID- take 5 ml po daily as supplement    Yes [provider]  polyethylene glycol (MIRALAX / GLYCOLAX) packet Take 17 g by mouth daily.   Yes [provider]  pravastatin (PRAVACHOL) 40 MG tablet Take 40 mg  by mouth daily.   Yes [provider]  rivastigmine (EXELON) 6 MG capsule Take 6 mg by mouth 2 (two) times daily.    Yes [provider]  vitamin B-12 (CYANOCOBALAMIN) 1000 MCG tablet Take 1,000 mcg by mouth daily.   Yes [provider]    Physical Exam: Vitals:   12/27/18 1000 12/27/18 1015 12/27/18 1030 12/27/18 1100  BP: (!) 155/76 (!) 166/84 (!) 164/82 (!) 152/97  Pulse: 93 87 91 77  Resp: (!) Temp:      TempSrc:      SpO2: 98% 98% 100% 99%      General:  Appears calm and comfortable and is NAD; eyes are closed and she does not respond much to stimulation  Eyes: closed with normal lids  ENT: normal lips & tongue, mmm  Neck:  no LAD, masses or thyromegaly  Cardiovascular:  RRR, no m/r/g. No LE edema.   Respiratory:   CTA bilaterally with no wheezes/rales/rhonchi.  Normal respiratory effort.  Abdomen:  soft, NT, ND, NABS  Skin:  no rash or induration seen on limited exam  Musculoskeletal:  grossly normal tone BUE/BLE, good ROM, no bony abnormality  Psychiatric: non-interactive, non-verbal, not mobile - at apparent baseline  Neurologic: unable to perform    Radiological Exams on Admission: Ct Abdomen Pelvis W Contrast  Result Date: 12/27/2018 CLINICAL DATA:  Failure to thrive. Projectile vomiting after eating. EXAM: CT ABDOMEN AND PELVIS WITH CONTRAST TECHNIQUE: Multidetector CT imaging of the abdomen and pelvis was performed using the standard protocol following bolus administration of intravenous contrast. CONTRAST:  OMNIPAQUE IOHEXOL 300 MG/ML  SOLN COMPARISON:  Report from 01/17/2010 CT FINDINGS: Lower chest: Normal heart size without pericardial effusion or thickening. Patchy bibasilar airspace disease consistent with multilobar pneumonia. No effusion or pneumothorax. Hepatobiliary: Tiny subcentimeter scattered hypodensities are noted within the liver compatible with cysts, biliary hamartomas or small hemangiomas. Status  post cholecystectomy. No choledocholithiasis. Reservoir effect with intrahepatic and extrahepatic ductal dilatation likely on the basis of prior cholecystectomy. Pancreas: No ductal dilatation, mass or inflammation. Spleen: Normal Adrenals/Urinary Tract: Thickening of the adrenal glands. Small cystic nodule of the right adrenal gland measuring 7 mm possibly a small adenoma but is too small to further characterize. Cortical scarring within both kidneys subcentimeter cyst associated with the lower pole the right kidney and interpolar left kidney. No nephrolithiasis nor obstructive uropathy. The urinary bladder is decompressed. Stomach/Bowel: The stomach is physiologically distended without mucosal thickening. Normal small bowel rotation without obstruction. Significant stool retention throughout the colon consistent constipation with probable fecal impaction in the rectum. Unremarkable appendix. Vascular/Lymphatic: Moderate aortoiliac atherosclerosis without aneurysm. No lymphadenopathy. Reproductive: Status post hysterectomy. No adnexal masses. Other: No free air free fluid.  Mild soft tissue anasarca. Musculoskeletal: Mild grade 1 anterolisthesis of L4 on L5 likely on the basis of degenerative disc disease and facet arthropathy. Vacuum disc phenomenon noted at L3-4 and L4-5. No acute fracture or aggressive osseous lesions. IMPRESSION: 1. Patchy multilobar airspace disease at the lung bases consistent multilobar pneumonia. Stigmata of CHF believed less likely given lack of significant pleural effusion or cardiomegaly. 2. Significant stool retention throughout the colon consistent constipation with probable fecal impaction. 3. Tiny too small to characterize cysts of the liver and both kidneys. Electronically Signed   By: Tollie Eth M.D.   On: 12/27/2018 01:02    EKG: Independently reviewed.  NSR with rate 65; nonspecific ST changes with no evidence of acute ischemia   Labs on Admission: I have personally  reviewed the available labs and imaging studies at the time of the admission.  Pertinent labs:   Na++ 146 Glucose 122 Platelets 133 UA: many bacteria, small LE  Assessment/Plan Principal Problem:   Emesis Active Problems:   Dementia (HCC)   Hypernatremia   Hypertension   Chronic constipation   Seizure (HCC)   Multifocal pneumonia   Emesis, likely due to constipation -Patient with  apparently 1 episode of emesis at her facility -Imaging indicates significant constipation -Will attempt manual disimpaction and good bowel regimen -Will observe overnight on Med Surg -Anticipate d/c back to SNF tomorrow  Multifocal PNA -Patient without reported symptoms of PNA but found to be hypoxic on arrival -She did have an episode of emesis at her facility -She appears to be at increased risk for aspiration -CT showed multifocal PNA - which in this case appears likely related to aspiration -Will order procalcitonin -Initial plan was to treat with Unasyn but she has an unspecified PCN allergy - will treat with second line Rocephin instead -NPO pending speech therapy evaluation -If doing well from this standpoint, she should be appropriate for d/c back to SNF tomorrow  Advanced dementia -Patient has very limited capacity to interact with her environment at this time, and yet remains full code -She is on Namenda and Exelon, and these medications do not appear to provide ongoing efficacy and so may be considered for d/c as an inpatient vs. Outpatient -Suggest consideration of palliative care consultation inpatient vs. outpatient  Seizure d/o -Patient with known seizure d/o and had 1-2 seizures overnight -Loaded with Keppra per neurology -Neurology discussed the case and opined that her seizure threshold was lowered by her active infection -Will continue Keppra 500 mg PO daily  HTN -Continue Norvasc and hydralazine  Hypernatremia -Likely related to mild dehydration in the setting of  emesis and presumed poor PO intake -Will hydrate with LR and recheck in AM    DVT prophylaxis:  Lovenox  Code Status:  Full - confirmed with family Family Communication: I called and spoke with her daughter by telephone Disposition Plan:  Home once clinically improved Consults called: Neurology by telephone only; SW; ST Admission status: It is my clinical opinion that referral for OBSERVATION is reasonable and necessary in this patient based on the above information provided. The aforementioned taken together are felt to place the patient at high risk for further clinical deterioration. However it is anticipated that the patient may be medically stable for discharge from the hospital within 24 to 48 hours.    Jonah Blue MD [facility]   How to contact the [facility] Attending or Consulting provider 7A - 7P or covering provider during after hours 7P -7A, for this patient?  1. Check the care team in [facility] and look for a) attending/consulting [facility] provider listed and b) the [facility] team listed 2. Log into [email or web address] and use [facility] universal password to access. If you do not have the password, please contact the hospital operator. 3. Locate the [facility] provider you are looking for under [facility] and page to a number that you can be directly reached. 4. If you still have difficulty reaching the provider, please page the [facility] (Director on Call) for the Hospitalists listed on amion for assistance.   12/27/2018, 11:55 AM

## 2018-12-28 ENCOUNTER — Other Ambulatory Visit: Payer: Self-pay

## 2018-12-28 ENCOUNTER — Encounter (HOSPITAL_COMMUNITY): Payer: Self-pay | Admitting: Primary Care

## 2018-12-28 ENCOUNTER — Observation Stay (HOSPITAL_COMMUNITY): Payer: Medicare Other

## 2018-12-28 DIAGNOSIS — Z515 Encounter for palliative care: Secondary | ICD-10-CM

## 2018-12-28 DIAGNOSIS — J189 Pneumonia, unspecified organism: Secondary | ICD-10-CM | POA: Diagnosis not present

## 2018-12-28 DIAGNOSIS — F0390 Unspecified dementia without behavioral disturbance: Secondary | ICD-10-CM | POA: Diagnosis not present

## 2018-12-28 DIAGNOSIS — Z7189 Other specified counseling: Secondary | ICD-10-CM

## 2018-12-28 DIAGNOSIS — R569 Unspecified convulsions: Secondary | ICD-10-CM | POA: Diagnosis not present

## 2018-12-28 DIAGNOSIS — E440 Moderate protein-calorie malnutrition: Secondary | ICD-10-CM

## 2018-12-28 LAB — BASIC METABOLIC PANEL
Anion gap: 5 (ref 5–15)
BUN: 13 mg/dL (ref 8–23)
CO2: 27 mmol/L (ref 22–32)
Calcium: 8.8 mg/dL — ABNORMAL LOW (ref 8.9–10.3)
Chloride: 115 mmol/L — ABNORMAL HIGH (ref 98–111)
Creatinine, Ser: 0.55 mg/dL (ref 0.44–1.00)
GFR calc Af Amer: >60 mL/min (ref >60)
GFR calc non Af Amer: >60 mL/min (ref >60)
Glucose, Bld: 92 mg/dL (ref 70–99)
POTASSIUM: 3.5 mmol/L (ref 3.5–5.1)
Sodium: 147 mmol/L — ABNORMAL HIGH (ref 135–145)

## 2018-12-28 LAB — CBC
HCT: 35.8 % — ABNORMAL LOW (ref 36.0–46.0)
Hemoglobin: 11.5 g/dL — ABNORMAL LOW (ref 12.0–15.0)
MCH: 31.3 pg (ref 26.0–34.0)
MCHC: 32.1 g/dL (ref 30.0–36.0)
MCV: 97.3 fL (ref 80.0–100.0)
Platelets: 102 10*3/uL — ABNORMAL LOW (ref 150–400)
RBC: 3.68 MIL/uL — ABNORMAL LOW (ref 3.87–5.11)
RDW: 12.7 % (ref 11.5–15.5)
WBC: 8.4 10*3/uL (ref 4.0–10.5)
nRBC: 0 % (ref 0.0–0.2)

## 2018-12-28 MED ORDER — MILK AND MOLASSES ENEMA: 1.0000 | Freq: Once | RECTAL | Status: DC

## 2018-12-28 MED ORDER — DOCUSATE SODIUM 100 MG PO CAPS
100.0000 mg | ORAL_CAPSULE | Freq: Two times a day (BID) | ORAL | Status: DC
  Administered 2018-12-28 – 2018-12-29 (×3): 100 mg via ORAL
  Filled 2018-12-28 (×3): qty 1

## 2018-12-28 NOTE — Progress Notes (Signed)
Initial Nutrition Assessment  DOCUMENTATION CODES:   Non-severe (moderate) malnutrition in context of chronic illness  INTERVENTION:   Magic cup TID with meals, each supplement provides 290 kcal and 9 grams of protein  Feeding assistance and encouragement at meal times  NUTRITION DIAGNOSIS:   Moderate Malnutrition related to chronic illness(FTT, dementia) as evidenced by mild fat depletion, moderate muscle depletion.  GOAL:   Patient will meet greater than or equal to 90% of their needs  MONITOR:   PO intake, Supplement acceptance, Labs, Weight trends  REASON FOR ASSESSMENT:   Malnutrition Screening Tool    ASSESSMENT:   78 yo female with multifocal pneumonia likely related to aspiration post vomiting; vomiting likely related to significant constipation on imaging. PMH includes dementia (non-verbal and bed bound at baseline), seizure disorder, HTN, HLD, FTT  Noted palliative care consulted Pt alert, vocalizing but no real intelligible words  No family at bedside; unable to obtain diet or weight history. No new weight from this admission. Need new weight  Diet advanced to Dysphagia I, Nectar thick post SLP eval on 3/12 Recorded po intake 80% at breakfast this AM, 75% at lunch  Labs: sodium 147 (H), [place] wdl Meds: reviewed   NUTRITION - FOCUSED PHYSICAL EXAM: Noted pt with lots of excess skin indicating weight loss at some point in time. Pt is bed bound so degree of muscle wasting in LE is not solely related to nutrition status    Most Recent Value  Orbital Region  Mild depletion  Upper Arm Region  No depletion  Thoracic and Lumbar Region  Moderate depletion  Buccal Region  Mild depletion  Temple Region  Severe depletion  Clavicle Bone Region  Moderate depletion  Clavicle and Acromion Bone Region  Moderate depletion  Scapular Bone Region  Moderate depletion  Dorsal Hand  Moderate depletion  Patellar Region  Severe depletion  Anterior Thigh Region  Severe  depletion  Posterior Calf Region  Severe depletion  Edema (RD Assessment)  None       Diet Order:   Diet Order            DIET - DYS 1 Room service appropriate? Yes; Fluid consistency: Nectar Thick  Diet effective now              EDUCATION NEEDS:   Not appropriate for education at this time  Skin:  Skin Assessment: Reviewed RN Assessment  Last BM:  3/12  Height:   Ht Readings from Last 1 Encounters:  12/20/18 5\' 6"  (1.676 m)    Weight:   Wt Readings from Last 1 Encounters:  12/20/18 69.9 kg    Ideal Body Weight:     BMI:  There is no height or weight on file to calculate BMI.  Estimated Nutritional Needs:   Kcal:  1580-1770 kcals   Protein:  84-97 g   Fluid:  >/= 1.6 L    Romelle Starcher MS, RD, LDN, CNSC [phone] Pager  [phone] Weekend/On-Call Pager'

## 2018-12-28 NOTE — Consult Note (Signed)
Consultation Note Date: 12/28/2018   Patient Name: Melissa Jarvis  DOB: [birth year]  MRN: [id number]  Age / Sex: 78 y.o., female  PCP: [name], MD Referring Physician: Joseph Art, DO  Reason for Consultation: Establishing goals of care  HPI/Patient Profile: 78 y.o. female  with past medical history of alzheimer disease, FTT in adult, high blood pressure and cholesterol, seizure disorder, HTN, OA, osteporosis, resident of [facility] admitted on 12/26/2018 with multifocal PNE.  PMT consulted for GOC.  .   Clinical Assessment and Goals of Care: Mrs. Grell is lying quietly in bed.  She appears chronically ill and frail.  Her eyes are open, and after calling her name several times she looks in my direction briefly making but not keeping eye contact.  She has known dementia and says 1 word which I am unable to understand.  At this point she is unable to make her basic needs known.  There is no family at bedside at this time.  Call to daughter, [name] at [phone], generic voicemail message left.    HCPOA   NEXT OF KIN -2 daughters listed as emergency contact, [name] and [name].  No healthcare power of attorney paperwork noted in document section of epic.   SUMMARY OF RECOMMENDATIONS   At this point full scope/full code. Start CODE STATUS discussions with responsible party Anticipate return to [facility] residential SNF  Code Status/Advance Care Planning:  Full code -PMT to discuss CODE STATUS with daughters.  Symptom Management:   Per hospitalist, no additional needs at this time.  Palliative Prophylaxis:   Aspiration and Turn Reposition  Additional Recommendations (Limitations, Scope, Preferences):  Full Scope Treatment  Psycho-social/Spiritual:   Desire for further Chaplaincy support:no  Additional Recommendations: Caregiving  Support/Resources and  Education on Hospice  Prognosis:   Unable to determine, based on outcomes.  6 to 12 months or less would not be surprising based on what appears to be poor functional status, frailty, chronic illness burden, advancing dementia, aspiration.  Discharge Planning: Anticipate return to [facility].      Primary Diagnoses: Present on Admission: . Emesis . Chronic constipation . Dementia (HCC) . Hypertension . Hypernatremia . Multifocal pneumonia   I have reviewed the medical record, interviewed the patient and family, and examined the patient. The following aspects are pertinent.  Past Medical History:  Diagnosis Date  . Alzheimer's disease (HCC)   . Chronic constipation   . Depressed   . Failure to thrive in adult   . HCAP (healthcare-associated pneumonia)   . Hyperlipidemia   . Hypernatremia   . Hypertension   . Hypokalemia   . Low back pain   . Migraines   . Osteoarthritis   . Osteoporosis   . PBA (pseudobulbar affect)   . Post-ictal state (HCC)   . Seizures (HCC)   . Urinary tract infection with hematuria   . Vitamin B12 deficiency    Social History   Socioeconomic History  . Marital  status: Widowed    Spouse name: Not on file  . Number of children: Not on file  . Years of education: Not on file  . Highest education level: Not on file  Occupational History  . Not on file  Social Needs  . Financial resource strain: Not on file  . Food insecurity:    Worry: Not on file    Inability: Not on file  . Transportation needs:    Medical: Not on file    Non-medical: Not on file  Tobacco Use  . Smoking status: Former Games developer  . Smokeless tobacco: Never Used  Substance and Sexual Activity  . Alcohol use: No  . Drug use: No  . Sexual activity: Not on file  Lifestyle  . Physical activity:    Days per week: Not on file    Minutes per session: Not on file  . Stress: Not on file  Relationships  . Social connections:    Talks on phone: Not on file     Gets together: Not on file    Attends religious service: Not on file    Active member of club or organization: Not on file    Attends meetings of clubs or organizations: Not on file    Relationship status: Not on file  Other Topics Concern  . Not on file  Social History Narrative         Family History  Problem Relation Age of Onset  . Hypertension Mother   . Peripheral vascular disease Mother        Bilateral leg amputations  . Alzheimer's disease Father   . Stroke Brother   . Heart attack Brother    Scheduled Meds: . amLODipine  10 mg Oral Daily  . aspirin EC  81 mg Oral Daily  . Dextromethorphan-quiNIDine  1 capsule Oral Daily  . docusate sodium  100 mg Oral BID  . enoxaparin (LOVENOX) injection  40 mg Subcutaneous Q24H  . hydrALAZINE  50 mg Oral BID  . milk and molasses  1 enema Rectal Once  . pravastatin  40 mg Oral Daily   Continuous Infusions: . cefTRIAXone (ROCEPHIN)  IV 1 g (12/28/18 1340)  . lactated ringers 100 mL/hr at 12/28/18 0923  . levETIRAcetam 500 mg (12/28/18 1257)   PRN Meds:.acetaminophen **OR** acetaminophen, bisacodyl, bisacodyl, hydrALAZINE, ondansetron **OR** ondansetron (ZOFRAN) IV Medications Prior to Admission:  Prior to Admission medications   Medication Sig Start Date End Date Taking? Authorizing Provider  acetaminophen (TYLENOL) 325 MG tablet Take 650 mg by mouth every 6 (six) hours as needed for mild pain.    Yes [provider]  amLODipine (NORVASC) 10 MG tablet Take 10 mg by mouth daily.   Yes [provider]  aspirin (ASPIRIN EC) 81 MG EC tablet Take 81 mg by mouth daily. Swallow whole.    Yes [provider]  bisacodyl (DULCOLAX) 10 MG suppository Place 10 mg rectally daily as needed for moderate constipation (if not relieved by MOM).    Yes [provider]  bisacodyl (FLEET) 10 MG/30ML ENEM Place 10 mg rectally daily as needed (if not relieved by Bisacodyl suppository).    Yes [provider]  calcium carbonate (OS-CAL) 1250 (500 Ca) MG chewable tablet Chew 1 tablet by mouth daily.   Yes [provider]  Cholecalciferol (D3-1000) 1000 units tablet Take 1,000 Units by mouth daily.   Yes [provider]  Dextromethorphan-quiNIDine (NUEDEXTA) 20-10 MG CAPS Take 1 capsule by mouth daily.  Yes [provider]  feeding supplement, ENSURE ENLIVE, (ENSURE ENLIVE) LIQD Take 237 mLs by mouth 4 (four) times daily.   Yes [provider]  hydrALAZINE (APRESOLINE) 50 MG tablet Take 50 mg by mouth 2 (two) times daily.   Yes [provider]  levETIRAcetam (KEPPRA) 100 MG/ML solution Take 5 mLs (500 mg total) by mouth 2 (two) times daily. 02/14/18  Yes Talbert Forest, [place], DO  Magnesium Hydroxide (MILK OF MAGNESIA PO) Take 30 mLs by mouth daily as needed (if no BM in 3 days).    Yes [provider]  memantine (NAMENDA XR) 28 MG CP24 24 hr capsule Take 28 mg by mouth at bedtime.    Yes [provider]  NUTRITIONAL SUPPLEMENTS PO Initiate 120 ml Medpass 3 times daily with meals related to weight loss   Yes [provider]  Omega-3 Fatty Acids (FISH OIL PO) Take 5 mLs by mouth daily. 1,600 MG/5 ML LIQUID- take 5 ml po daily as supplement    Yes [provider]  polyethylene glycol (MIRALAX / GLYCOLAX) packet Take 17 g by mouth daily.   Yes [provider]  pravastatin (PRAVACHOL) 40 MG tablet Take 40 mg by mouth daily.   Yes [provider]  rivastigmine (EXELON) 6 MG capsule Take 6 mg by mouth 2 (two) times daily.    Yes [provider]  vitamin B-12 (CYANOCOBALAMIN) 1000 MCG tablet Take 1,000 mcg by mouth daily.   Yes [provider]   Allergies  Allergen Reactions  . Penicillins Other (See Comments)    Tolerated Ceftriaxone 04/2018 Other reaction(s): UNKNOWN REACTION     Review of Systems  Unable to perform ROS: Dementia    Physical Exam Vitals signs and nursing note reviewed.   Constitutional:      Comments: Will briefly make but not keep eye contact, appears chronically ill and frail  HENT:     Head: Atraumatic.  Cardiovascular:     Rate and Rhythm: Normal rate.  Pulmonary:     Effort: Pulmonary effort is normal. No respiratory distress.  Abdominal:     General: Abdomen is flat. There is no distension.     Tenderness: There is no guarding.  Skin:    General: Skin is warm and dry.  Neurological:     Mental Status: She is alert.     Comments: Known dementia  Psychiatric:     Comments: Calm, not fearful     Vital Signs: BP (!) 198/70 (BP Location: Right Arm)   Pulse 90   Temp 98.1 F (36.7 C) (Oral)   Resp 20   SpO2 100%  Pain Scale: Faces       SpO2: SpO2: 100 % O2 Device:SpO2: 100 % O2 Flow Rate: .O2 Flow Rate (L/min): 3 L/min  IO: Intake/output summary:   Intake/Output Summary (Last 24 hours) at 12/28/2018 1542 Last data filed at 12/28/2018 1300 Gross per 24 hour  Intake 2011.75 ml  Output 0 ml  Net 2011.75 ml    LBM: Last BM Date: 12/27/18 Baseline Weight:   Most recent weight:       Palliative Assessment/Data:   Flowsheet Rows     Most Recent Value  Intake Tab  Referral Department  Hospitalist  Unit at Time of Referral  Med/Surg Unit  Palliative Care Primary Diagnosis  Pulmonary  Date Notified  12/28/18  Palliative Care Type  New Palliative care  Reason for referral  Clarify Goals of Care  Date of Admission  12/26/18  Date first seen by Palliative Care  12/28/18  # of days Palliative referral response time  0 Day(s)  # of days IP prior to Palliative referral  2  Clinical Assessment  Palliative Performance Scale Score  30%  Pain Max last 24 hours  Not able to report  Pain Min Last 24 hours  Not able to report  Dyspnea Max Last 24 Hours  Not able to report  Dyspnea Min Last 24 hours  Not able to report  Psychosocial & Spiritual Assessment  Palliative Care Outcomes      Time In: 1530 Time Out: 1620 Time Total: 50  minutes  Greater than 50%  of this time was spent counseling and coordinating care related to the above assessment and plan.  Signed by: Katheran Awe, NP   Please contact Palliative Medicine Team phone at [phone] for questions and concerns.  For individual provider: See Loretha Stapler

## 2018-12-28 NOTE — Care Management Obs Status (Signed)
MEDICARE OBSERVATION STATUS NOTIFICATION   Patient Details  Name: Melissa Jarvis MRN: [id number] Date of Birth: 1941-05-20   Medicare Observation Status Notification Given:  Yes    Bess Kinds, RN 12/28/2018, 4:41 PM

## 2018-12-28 NOTE — Progress Notes (Signed)
[facility] PROGRESS NOTE  Melissa Jarvis KLK:[id number] DOB: 02-16-1941 DOA: 12/26/2018 PCP: [name], MD  Assessment/Plan: Emesis, likely due to constipation -Patient with apparently 1 episode of emesis at her facility -Imaging indicates significant constipation -milk and molasses enema with colace. Stop miralax as pt unable to maintain hydration -continue IV fluids  Multifocal PNA -Patient without reported symptoms of PNA but found to be hypoxic on arrival. She did have an episode of emesis at her facility. At risk for aspiration.CT showed multifocal PNA -  likely related to aspiration. Initial plan was to treat with Unasyn but she has an unspecified PCN allergy - will treat with second line Rocephin instead.evaluated by speech therapy who recommends pureed diet with nectar thick liquid and MBSS. She is afebrile and non-toxic appearing -MBSS -continue antibiotics  Advanced dementia -Patient has very limited capacity to interact with her environment at this time, and yet remains full code -She is on Namenda and Exelon, and these medications do not appear to provide ongoing efficacy and so may be considered for d/c as an inpatient vs. Outpatient -have requested palliative care consultation inpatient vs. outpatient  Seizure d/o -Patient with known seizure d/o and had 1-2 seizures overnight -Loaded with Keppra per neurology -Neurology discussed the case and opined that her seizure threshold was lowered by her active infection -Will continue Keppra 500 mg PO daily -no further seizure activity  HTN -Continue Norvasc and hydralazine  Hypernatremia. Hx of same. Current sodium level close to baseline.  Likely related to ongoing dehydration in the setting poor PO intake -continue LR -recheck in am    Code Status: full Family Communication: non presnet Disposition Plan: back to facility hopefully in am   Consultants:  Palliative  care  Procedures:  MBSS  Antibiotics:  Rocephin 12/27/18>>  HPI/Subjective: Melissa Jarvis is a 78 y.o. female with medical history significant of dementia (non-verbal at baseline); seizure d/o; HTN; and HLD presen 3/12 from facility with vomiting.  Patient apparently had 1 episode of emesis at her facility and was sent to the ER for further evaluation.  At baseline, she is non-verbal and bedbound.  Daughter, who was present in the ER with her all night, and little additional information was available  Objective: Vitals:   12/28/18 0502 12/28/18 0502  BP: (!) 197/61 (!) 197/61  Pulse: 77 77  Resp: 18 18  Temp: 98.8 F (37.1 C) 98.8 F (37.1 C)  SpO2: 100% 100%    Intake/Output Summary (Last 24 hours) at 12/28/2018 1220 Last data filed at 12/28/2018 9794 Gross per 24 hour  Intake 1891.75 ml  Output 0 ml  Net 1891.75 ml   There were no vitals filed for this visit.  Exam:   General:  Lying in bed eyes closed. Will not open eyes or follow commands  Cardiovascular: rrr no mgr no LE edema  Respiratory: normal effort BS distant no wheeze  Abdomen: soft +BS no guarding or rebounding  Musculoskeletal: joints without swelling/erythema   Data Reviewed: Basic Metabolic Panel: Recent Labs  Lab 12/26/18 2332 12/28/18 0549  NA 146* 147*  K 3.6 3.5  CL 109 115*  CO2 27 27  GLUCOSE 122* 92  BUN 22 13  CREATININE 0.74 0.55  CALCIUM 9.5 8.8*   Liver Function Tests: Recent Labs  Lab 12/26/18 2332  AST 20  ALT 12  ALKPHOS 78  BILITOT 0.5  PROT 6.9  ALBUMIN 3.5   Recent Labs  Lab 12/26/18 2332  LIPASE 36  No results for input(s): AMMONIA in the last 168 hours. CBC: Recent Labs  Lab 12/26/18 2332 12/28/18 0549  WBC 4.3 8.4  HGB 14.3 11.5*  HCT 45.5 35.8*  MCV 98.5 97.3  PLT 133* 102*   Cardiac Enzymes: No results for input(s): CKTOTAL, CKMB, CKMBINDEX, TROPONINI in the last 168 hours. BNP (last 3 results) No results for input(s): BNP in the last  8760 hours.  ProBNP (last 3 results) No results for input(s): PROBNP in the last 8760 hours.  CBG: Recent Labs  Lab 12/27/18 0008  GLUCAP 122*    Recent Results (from the past 240 hour(s))  MRSA PCR Screening     Status: None   Collection Time: 12/27/18  6:41 PM  Result Value Ref Range Status   MRSA by PCR NEGATIVE NEGATIVE Final    Comment:        The GeneXpert MRSA Assay (FDA approved for NASAL specimens only), is one component of a comprehensive MRSA colonization surveillance program. It is not intended to diagnose MRSA infection nor to guide or monitor treatment for MRSA infections. Performed at [facility], [address], [place], [place] [address]      Studies: Ct Abdomen Pelvis W Contrast  Result Date: 12/27/2018 CLINICAL DATA:  Failure to thrive. Projectile vomiting after eating. EXAM: CT ABDOMEN AND PELVIS WITH CONTRAST TECHNIQUE: Multidetector CT imaging of the abdomen and pelvis was performed using the standard protocol following bolus administration of intravenous contrast. CONTRAST:  OMNIPAQUE IOHEXOL 300 MG/ML  SOLN COMPARISON:  Report from 01/17/2010 CT FINDINGS: Lower chest: Normal heart size without pericardial effusion or thickening. Patchy bibasilar airspace disease consistent with multilobar pneumonia. No effusion or pneumothorax. Hepatobiliary: Tiny subcentimeter scattered hypodensities are noted within the liver compatible with cysts, biliary hamartomas or small hemangiomas. Status post cholecystectomy. No choledocholithiasis. Reservoir effect with intrahepatic and extrahepatic ductal dilatation likely on the basis of prior cholecystectomy. Pancreas: No ductal dilatation, mass or inflammation. Spleen: Normal Adrenals/Urinary Tract: Thickening of the adrenal glands. Small cystic nodule of the right adrenal gland measuring 7 mm possibly a small adenoma but is too small to further characterize. Cortical scarring within both kidneys subcentimeter cyst  associated with the lower pole the right kidney and interpolar left kidney. No nephrolithiasis nor obstructive uropathy. The urinary bladder is decompressed. Stomach/Bowel: The stomach is physiologically distended without mucosal thickening. Normal small bowel rotation without obstruction. Significant stool retention throughout the colon consistent constipation with probable fecal impaction in the rectum. Unremarkable appendix. Vascular/Lymphatic: Moderate aortoiliac atherosclerosis without aneurysm. No lymphadenopathy. Reproductive: Status post hysterectomy. No adnexal masses. Other: No free air free fluid.  Mild soft tissue anasarca. Musculoskeletal: Mild grade 1 anterolisthesis of L4 on L5 likely on the basis of degenerative disc disease and facet arthropathy. Vacuum disc phenomenon noted at L3-4 and L4-5. No acute fracture or aggressive osseous lesions. IMPRESSION: 1. Patchy multilobar airspace disease at the lung bases consistent multilobar pneumonia. Stigmata of CHF believed less likely given lack of significant pleural effusion or cardiomegaly. 2. Significant stool retention throughout the colon consistent constipation with probable fecal impaction. 3. Tiny too small to characterize cysts of the liver and both kidneys. Electronically Signed   By: Tollie Eth M.D.   On: 12/27/2018 01:02    Scheduled Meds: . amLODipine  10 mg Oral Daily  . aspirin EC  81 mg Oral Daily  . Dextromethorphan-quiNIDine  1 capsule Oral Daily  . docusate sodium  100 mg Oral BID  . enoxaparin (LOVENOX) injection  40 mg Subcutaneous Q24H  . hydrALAZINE  50 mg Oral BID  . milk and molasses  1 enema Rectal Once  . pravastatin  40 mg Oral Daily   Continuous Infusions: . cefTRIAXone (ROCEPHIN)  IV 1 g (12/27/18 1316)  . lactated ringers 100 mL/hr at 12/28/18 0923  . levETIRAcetam 500 mg (12/28/18 0048)    Principal Problem:   Multifocal pneumonia Active Problems:   Seizure (HCC)   Emesis   Dementia (HCC)    Hypernatremia   Hypertension   Chronic constipation    Time spent: 45 minutes    [facility] M NP [facility]  If 7PM-7AM, please contact night-coverage at [email or web address], password [facility] 12/28/2018, 12:20 PM  LOS: 0 days

## 2018-12-28 NOTE — Progress Notes (Addendum)
Modified Barium Swallow Progress Note  Patient Details  Name: Melissa Jarvis MRN: [id number] Date of Birth: 1941/10/02  Today's Date: 12/28/2018  Modified Barium Swallow completed.  Full report located under Chart Review in the Imaging Section.  Brief recommendations include the following:  Clinical Impression  Pt presents with primary oral dysphagia most likely secondary to baseline dementia. Pt very lethargic, with eyes closed throughout testing. Max question, verbal, and tactile stimulation cues provided to increase alertness with no success. Though lethargic looking, bolus touched to lips triggered mouth opening and acceptance of trials. Oral holding, reduced lingual coordination and bolus formulation observed with subsequent premature spillage of all POs into the vallecula and pyriform sinuses. She required max verbal/tactile cues to swallow all POs throughout session with pill ultimately having to be manually removed from oral cavity. No aspiration noted, but flash penetration observed x2 occurred during thin liquid intake via cup/straw. Strength of laryngeal closure adequate for clearing penetrate. Recommend advancement to dys 3, thin with meds crushed in puree. Pt requires full supervision for cues to swallow. SLP to f/u for tolerance and to provide strategies and education as indicated.   Swallow Evaluation Recommendations       SLP Diet Recommendations: Dysphagia 3 (Mech soft) solids;Thin liquid   Liquid Administration via: Cup   Medication Administration: Crushed with puree   Supervision: Full supervision/cueing for compensatory strategies   Compensations: Minimize environmental distractions;Slow rate;Small sips/bites   Postural Changes: Remain semi-upright after after feeds/meals (Comment);Seated upright at 90 degrees   Oral Care Recommendations: Oral care BID        Gardiner Ramus, Student SLP 12/28/2018,1:30 PM

## 2018-12-28 NOTE — Care Management Obs Status (Signed)
MEDICARE OBSERVATION STATUS NOTIFICATION   Patient Details  Name: Melissa Jarvis MRN: [id number] Date of Birth: [birth year]   Medicare Observation Status Notification Given:  Yes    Bess Kinds, RN 12/28/2018, 3:48 PM

## 2018-12-29 DIAGNOSIS — J189 Pneumonia, unspecified organism: Secondary | ICD-10-CM | POA: Diagnosis not present

## 2018-12-29 DIAGNOSIS — I10 Essential (primary) hypertension: Secondary | ICD-10-CM | POA: Diagnosis not present

## 2018-12-29 DIAGNOSIS — M2550 Pain in unspecified joint: Secondary | ICD-10-CM | POA: Diagnosis not present

## 2018-12-29 DIAGNOSIS — Z7401 Bed confinement status: Secondary | ICD-10-CM | POA: Diagnosis not present

## 2018-12-29 DIAGNOSIS — R410 Disorientation, unspecified: Secondary | ICD-10-CM | POA: Diagnosis not present

## 2018-12-29 LAB — BASIC METABOLIC PANEL
Anion gap: 9 (ref 5–15)
BUN: 6 mg/dL — ABNORMAL LOW (ref 8–23)
CO2: 27 mmol/L (ref 22–32)
Calcium: 9.4 mg/dL (ref 8.9–10.3)
Chloride: 111 mmol/L (ref 98–111)
Creatinine, Ser: 0.42 mg/dL — ABNORMAL LOW (ref 0.44–1.00)
GFR calc Af Amer: >60 mL/min (ref >60)
GFR calc non Af Amer: >60 mL/min (ref >60)
Glucose, Bld: 100 mg/dL — ABNORMAL HIGH (ref 70–99)
Potassium: 3.2 mmol/L — ABNORMAL LOW (ref 3.5–5.1)
Sodium: 147 mmol/L — ABNORMAL HIGH (ref 135–145)

## 2018-12-29 MED ORDER — HYDRALAZINE HCL 50 MG PO TABS
75.0000 mg | ORAL_TABLET | Freq: Three times a day (TID) | ORAL | Status: DC
  Filled 2018-12-29: qty 1

## 2018-12-29 MED ORDER — LEVETIRACETAM 500 MG PO TABS
500.0000 mg | ORAL_TABLET | Freq: Two times a day (BID) | ORAL | Status: DC
  Administered 2018-12-29: 500 mg via ORAL
  Filled 2018-12-29: qty 1

## 2018-12-29 MED ORDER — POTASSIUM CHLORIDE CRYS ER 20 MEQ PO TBCR
40.0000 meq | EXTENDED_RELEASE_TABLET | ORAL | Status: AC
  Administered 2018-12-29 (×2): 40 meq via ORAL
  Filled 2018-12-29 (×2): qty 2

## 2018-12-29 MED ORDER — HYDRALAZINE HCL 25 MG PO TABS: 75.0000 mg | ORAL_TABLET | Freq: Three times a day (TID) | ORAL | 1 refills | Status: DC

## 2018-12-29 MED ORDER — DOXYCYCLINE HYCLATE 100 MG PO CAPS: 100.0000 mg | ORAL_CAPSULE | Freq: Two times a day (BID) | ORAL | 0 refills | Status: DC

## 2018-12-29 NOTE — TOC Initial Note (Signed)
Transition of Care [facility]) - Initial/Assessment Note    Patient Details  Name: Melissa Jarvis MRN: [id number] Date of Birth: 1940/11/27  Transition of [facility]) CM/SW Contact:    Doy Hutching, LCSWA Phone Number: 12/29/2018, 10:22 AM  Clinical Narrative:          CSW spoke with pt daughter Melissa Jarvis on the phone. Introduced self, role, and reason for visit. Pt daughter and her sister Melissa Jarvis are pt main supports. Pt has been at [facility] since completing rehab and is a LTC resident. Plan is for d/c back when medically stable.   Pt daughters in agreement, request a call when pt medically cleared for return back to SNF.          Expected Discharge Plan: Skilled Nursing Facility Barriers to Discharge: Continued Medical Work up   Patient Goals and CMS Choice Patient states their goals for this hospitalization and ongoing recovery are:: "to know she's okay"- pt daughter Melissa Jarvis CMS Medicare.gov Compare Post Acute Care list provided to:: Patient Choice offered to / list presented to : Adult Children  Expected Discharge Plan and Services Expected Discharge Plan: Skilled Nursing Facility   Post Acute Care Choice: Skilled Nursing Facility Living arrangements for the past 2 months: Skilled Nursing Facility     Prior Living Arrangements/Services Living arrangements for the past 2 months: Skilled Nursing Facility Lives with:: Facility Resident Patient language and need for interpreter reviewed:: No Do you feel safe going back to the place where you live?: Yes      Need for Family Participation in Patient Care: Yes (Comment)(decision making) Care giver support system in place?: Yes (comment)(pt two adult daughters)   Criminal Activity/Legal Involvement Pertinent to Current Situation/Hospitalization: No - Comment as needed  Activities of Daily Living Home Assistive Devices/Equipment: Oxygen ADL Screening (condition at time of admission) Patient's cognitive ability adequate to safely  complete daily activities?: No Is the patient deaf or have difficulty hearing?: Yes Does the patient have difficulty seeing, even when wearing glasses/contacts?: Yes Does the patient have difficulty concentrating, remembering, or making decisions?: Yes Patient able to express need for assistance with ADLs?: No Does the patient have difficulty dressing or bathing?: Yes Independently performs ADLs?: No Communication: Dependent Is this a change from baseline?: Pre-admission baseline Dressing (OT): Dependent Is this a change from baseline?: Pre-admission baseline Grooming: Dependent Is this a change from baseline?: Pre-admission baseline Feeding: Dependent Is this a change from baseline?: Pre-admission baseline Bathing: Dependent Is this a change from baseline?: Pre-admission baseline Toileting: Dependent Is this a change from baseline?: Pre-admission baseline In/Out Bed: Dependent Is this a change from baseline?: Pre-admission baseline Walks in Home: Dependent Is this a change from baseline?: Pre-admission baseline Does the patient have difficulty walking or climbing stairs?: No Weakness of Legs: Both Weakness of Arms/Hands: Both  Permission Sought/Granted Permission sought to share information with : Facility Medical sales representative, Family Supports Permission granted to share information with : No(pt disoriented x4)  Share Information with NAME: Melissa Jarvis  Permission granted to share info w AGENCY: [facility]  Permission granted to share info w Relationship: daughter  Permission granted to share info w Contact Information: [phone]  Emotional Assessment Appearance:: Appears stated age Attitude/Demeanor/Rapport: Unable to Assess Affect (typically observed): Unable to Assess Orientation: : Fluctuating Orientation (Suspected and/or reported Sundowners) Alcohol / Substance Use: Not Applicable Psych Involvement: No (comment)  Admission diagnosis:  Multifocal pneumonia  [J18.9] Emesis [R11.10] Patient Active Problem List   Diagnosis Date Noted  .  Malnutrition of moderate degree 12/28/2018  . Palliative care by specialist   . Goals of care, counseling/discussion   . Emesis 12/27/2018  . Enterococcus UTI 04/29/2018  . CAP (community acquired pneumonia) 04/25/2018  . Multifocal pneumonia 04/22/2018  . Post-ictal state (HCC) 02/18/2018  . Urinary tract infection with hematuria   . Seizure (HCC) 02/11/2018  . Chronic constipation 02/09/2018  . Dementia (HCC) 12/16/2017  . Failure to thrive in adult 12/16/2017  . Hypernatremia 12/16/2017  . Hypokalemia 12/16/2017  . Hypertension 12/16/2017  . 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 Hyperlipidemia 12/16/2017  . Osteoporosis 12/16/2017  . Vitamin B12 deficiency 12/16/2017  . Depression 12/16/2017   PCP:  [name], MD Pharmacy:   [facility] [place], [place] - [address] [place] [address] Phone: [phone] Fax: [phone]     Social Determinants of Health (SDOH) Interventions    Readmission Risk Interventions  No flowsheet data found.

## 2018-12-29 NOTE — NC FL2 (Signed)
[place] MEDICAID FL2 LEVEL OF CARE SCREENING TOOL     IDENTIFICATION  Patient Name: Melissa Jarvis Birthdate: 26-May-1941 Sex: female Admission Date (Current Location): 12/26/2018  [place] and [place] Number:  Haynes Bast [id number] Facility and Address:  [facility], [address], [place], [place] [address]      Provider Number: [id number]  Attending Physician Name and Address:  Joseph Art, DO  Relative Name and Phone Number:  [name], daughter, [phone]    Current Level of Care: Hospital Recommended Level of Care: Skilled Nursing Facility Prior Approval Number:    Date Approved/Denied:   PASRR Number: [id number]  Discharge Plan: SNF    Current Diagnoses: Patient Active Problem List   Diagnosis Date Noted  . Malnutrition of moderate degree 12/28/2018  . Palliative care by specialist   . Goals of care, counseling/discussion   . Emesis 12/27/2018  . Enterococcus UTI 04/29/2018  . CAP (community acquired pneumonia) 04/25/2018  . Multifocal pneumonia 04/22/2018  . Post-ictal state (HCC) 02/18/2018  . Urinary tract infection with hematuria   . Seizure (HCC) 02/11/2018  . Chronic constipation 02/09/2018  . Dementia (HCC) 12/16/2017  . Failure to thrive in adult 12/16/2017  . Hypernatremia 12/16/2017  . Hypokalemia 12/16/2017  . Hypertension 12/16/2017  . 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 Hyperlipidemia 12/16/2017  . Osteoporosis 12/16/2017  . Vitamin B12 deficiency 12/16/2017  . Depression 12/16/2017    Orientation RESPIRATION BLADDER Height & Weight     (disoriented x4)  O2, Normal(intermittant nasal canula) Incontinent, External catheter Weight:   Height:     BEHAVIORAL SYMPTOMS/MOOD NEUROLOGICAL BOWEL NUTRITION STATUS      Incontinent Diet(dysphagia 3 (mechanical soft); thin liquids)  AMBULATORY STATUS COMMUNICATION OF NEEDS Skin   Total Care Verbally Normal                       Personal Care  Assistance Level of Assistance  Bathing, Feeding, Dressing Bathing Assistance: Maximum assistance Feeding assistance: Maximum assistance Dressing Assistance: Maximum assistance     Functional Limitations Info  Sight, Hearing, Speech Sight Info: Adequate Hearing Info: Adequate Speech Info: Impaired    SPECIAL CARE FACTORS FREQUENCY                       Contractures Contractures Info: Not present    Additional Factors Info  Code Status, Allergies, Psychotropic Code Status Info: Full Code Allergies Info: Penicillins Psychotropic Info: Dextromethorphan-quiNIDine (NUEDEXTA) 20-10 MG per capsule 1 capsule daily PO         Current Medications (12/29/2018):  This is the current hospital active medication list Current Facility-Administered Medications  Medication Dose Route Frequency Provider Last Rate Last Dose  . acetaminophen (TYLENOL) tablet 650 mg  650 mg Oral Q6H PRN Jonah Blue, MD       Or  . acetaminophen (TYLENOL) suppository 650 mg  650 mg Rectal Q6H PRN Jonah Blue, MD      . amLODipine (NORVASC) tablet 10 mg  10 mg Oral Daily Jonah Blue, MD   10 mg at 12/28/18 7615  . aspirin EC tablet 81 mg  81 mg Oral Daily Jonah Blue, MD   81 mg at 12/28/18 1834  . bisacodyl (DULCOLAX) suppository 10 mg  10 mg Rectal Daily PRN Jonah Blue, MD      . bisacodyl (FLEET) enema 10 mg  10 mg Rectal Daily PRN Jonah Blue, MD      . cefTRIAXone (  ROCEPHIN) 1 g in sodium chloride 0.9 % 100 mL IVPB  1 g Intravenous Q24H Jonah Blue, MD 200 mL/hr at 12/28/18 1340 1 g at 12/28/18 1340  . Dextromethorphan-quiNIDine (NUEDEXTA) 20-10 MG per capsule 1 capsule  1 capsule Oral Daily Jonah Blue, MD   1 capsule at 12/28/18 0921  . docusate sodium (COLACE) capsule 100 mg  100 mg Oral BID Gwenyth Bender, NP   100 mg at 12/28/18 2224  . enoxaparin (LOVENOX) injection 40 mg  40 mg Subcutaneous Q24H Jonah Blue, MD   40 mg at 12/28/18 2224  . hydrALAZINE  (APRESOLINE) injection 5 mg  5 mg Intravenous Q4H PRN Jonah Blue, MD   5 mg at 12/29/18 0500  . hydrALAZINE (APRESOLINE) tablet 50 mg  50 mg Oral BID Jonah Blue, MD   50 mg at 12/28/18 2224  . levETIRAcetam (KEPPRA) IVPB 500 mg/100 mL premix  500 mg Intravenous Steva Colder, MD 400 mL/hr at 12/29/18 0127 500 mg at 12/29/18 0127  . milk and molasses enema  1 enema Rectal Once Black, Lesle Chris, NP      . ondansetron [facility]) tablet 4 mg  4 mg Oral Q6H PRN Jonah Blue, MD       Or  . ondansetron [facility]) injection 4 mg  4 mg Intravenous Q6H PRN Jonah Blue, MD      . potassium chloride SA (K-DUR,KLOR-CON) CR tablet 40 mEq  40 mEq Oral Q4H Black, Karen M, NP      . pravastatin (PRAVACHOL) tablet 40 mg  40 mg Oral Daily Jonah Blue, MD   40 mg at 12/28/18 1324     Discharge Medications: Please see discharge summary for a list of discharge medications.  Relevant Imaging Results:  Relevant Lab Results:   Additional Information SS#: [id number]  Doy Hutching, LCSWA

## 2018-12-29 NOTE — Progress Notes (Signed)
Melissa Jarvis to be D/C'd Nursing Home per MD order.  Discussed prescriptions and follow up appointments with the patient. Prescriptions given to patient, medication list explained in detail. Report given to staff in [facility].  Allergies as of 12/29/2018      Reactions   Penicillins Other (See Comments)   Tolerated Ceftriaxone 04/2018 Other reaction(s): UNKNOWN REACTION      Medication List    STOP taking these medications   polyethylene glycol packet Commonly known as:  MIRALAX / GLYCOLAX     TAKE these medications   acetaminophen 325 MG tablet Commonly known as:  TYLENOL Take 650 mg by mouth every 6 (six) hours as needed for mild pain.   amLODipine 10 MG tablet Commonly known as:  NORVASC Take 10 mg by mouth daily.   aspirin EC 81 MG EC tablet Generic drug:  aspirin Take 81 mg by mouth daily. Swallow whole.   bisacodyl 10 MG suppository Commonly known as:  DULCOLAX Place 10 mg rectally daily as needed for moderate constipation (if not relieved by MOM).   bisacodyl 10 MG/30ML Enem Commonly known as:  FLEET Place 10 mg rectally daily as needed (if not relieved by Bisacodyl suppository).   calcium carbonate 1250 (500 Ca) MG chewable tablet Commonly known as:  OS-CAL Chew 1 tablet by mouth daily.   D3-1000 25 MCG (1000 UT) tablet Generic drug:  Cholecalciferol Take 1,000 Units by mouth daily.   doxycycline 100 MG capsule Commonly known as:  VIBRAMYCIN Take 1 capsule (100 mg total) by mouth 2 (two) times daily for 2 days.   FISH OIL PO Take 5 mLs by mouth daily. 1,600 MG/5 ML LIQUID- take 5 ml po daily as supplement   hydrALAZINE 25 MG tablet Commonly known as:  APRESOLINE Take 3 tablets (75 mg total) by mouth every 8 (eight) hours. What changed:    medication strength  how much to take  when to take this   levETIRAcetam 100 MG/ML solution Commonly known as:  KEPPRA Take 5 mLs (500 mg total) by mouth 2 (two) times daily.   MILK OF MAGNESIA PO Take  30 mLs by mouth daily as needed (if no BM in 3 days).   Namenda XR 28 MG Cp24 24 hr capsule Generic drug:  memantine Take 28 mg by mouth at bedtime.   Nuedexta 20-10 MG capsule Generic drug:  Dextromethorphan-quiNIDine Take 1 capsule by mouth daily.   NUTRITIONAL SUPPLEMENTS PO Initiate 120 ml Medpass 3 times daily with meals related to weight loss   feeding supplement (ENSURE ENLIVE) Liqd Take 237 mLs by mouth 4 (four) times daily.   pravastatin 40 MG tablet Commonly known as:  PRAVACHOL Take 40 mg by mouth daily.   rivastigmine 6 MG capsule Commonly known as:  EXELON Take 6 mg by mouth 2 (two) times daily.   vitamin B-12 1000 MCG tablet Commonly known as:  CYANOCOBALAMIN Take 1,000 mcg by mouth daily.       Vitals:   12/29/18 1112 12/29/18 1351  BP: (!) 175/75 (!) 138/59  Pulse: 80 96  Resp:  18  Temp:    SpO2: 100% 99%    Skin clean, dry and intact without evidence of skin break down, no evidence of skin tears noted. IV catheter discontinued intact. Site without signs and symptoms of complications. Dressing and pressure applied. Pt denies pain at this time. No complaints noted.  An After Visit Summary was printed and given to the patient. Patient picked up by  [facility] via stretcher, and D/C to [facility] via ambulance transport.Marland Kitchen

## 2018-12-29 NOTE — Discharge Summary (Addendum)
Physician Discharge Summary  JULLISA GRIGORYAN ZOX:[id number] DOB: 22-May-1941 DOA: 12/26/2018  PCP: [name], MD  Admit date: 12/26/2018 Discharge date: 12/29/2018  Time spent: 45 minutes  Recommendations for Outpatient Follow-up:  1. Follow up with PCP 1-2 weeks for evaluation of symptoms.  2. Bowel regimen to prevent constipation 3. Discharge to [facility]. Recommend palliative care to follow 4. Doxycycline for 2 more days 5. BMP 1 week   Discharge Diagnoses:  Principal Problem:   Multifocal pneumonia Active Problems:   Seizure (HCC)   Emesis   Dementia (HCC)   Hypernatremia   Hypertension   Chronic constipation   Palliative care by specialist   Goals of care, counseling/discussion   Malnutrition of moderate degree   Discharge Condition: stable  Diet recommendation: dysphagia 3 thin liquids  There were no vitals filed for this visit.  History of present illness:  Melissa Jarvis a 78 y.o.femalewith medical history significant ofdementia (non-verbal at baseline); seizure d/o; HTN; and HLD presented 3/12 from facility with vomiting.Patient apparently had 1 episode of emesis at her facility and was sent to the ER for further evaluation. At baseline, she is non-verbal and bedbound. Daughter, who was present in the ER with her all night, and little additional information was available   Hospital Course:  Emesis, likely due to constipation -Patient with apparently 1 episode of emesis at her facility -Imaging indicated significant constipation -milk and molasses enema with "massive" amount stool per rn. Will continue colace. Stop miralax as pt unable to maintain hydration which is needed when taking miralax  Multifocal PNA -Patient without reported symptoms of PNA but found to be hypoxic on arrival to ED. She did have an episode of emesis at her facility so concern for aspiration. CT showed multifocal PNA -  likely related to aspiration. Initial plan was  to treat with Unasyn but she has an unspecified PCN allergy - provided with second line Rocephin for 3 days. Will discharge with doxy for 2 more days. Evaluated by speech therapy who recommended MBSS which revealed reduced lingual coordination and bolus formulation with no aspiration noted. Recommended dysphagia 3 diet with thin liquids with meds crushed in pureed. She requires full supervision for cues to swallow.  She is afebrile and non-toxic appearing. Will discharge with 2 more days doxycycline.  Advanced dementia -Patient has very limited capacity to interact with her environment at this time, and yet remains full code. Palliative care consult obtained. Recommend palliative care follow in snf. Continue  Namenda and Exelon.  Seizure d/o -Patient with known seizure d/o and had 1-2 seizures prior to admission. Loaded with Keppra per neurology. Neurology discussed the case and opined that her seizure threshold was lowered by her active infection. Recommended continuing keppra. no further seizure activity  HTN -Continue Norvasc and hydralazine but hydralazine dose increased.   Hypernatremia. Hx of same. Current sodium level close to baseline.  Likely related to ongoing dehydration in the setting poor PO intake.   Procedures:  MBSS  Consultations:  Palliative care  Discharge Exam: Vitals:   12/29/18 0812 12/29/18 1112  BP: (!) 190/89 (!) 175/75  Pulse: 82 80  Resp: 20   Temp: 99 F (37.2 C)   SpO2: 100% 100%    General: lyin in bed eyes closed. Opened eyes to verbal stimuli tomor Cardiovascular: rrr no mgr no LE edema Respiratory: normal effort BS clear bilaterally no wheeze Gi: abd soft +BS no guarding or rebounding  Discharge Instructions   Discharge Instructions  Call MD for:  temperature >100.4   Complete by:  As directed    Diet - low sodium heart healthy   Complete by:  As directed    Dysphagia 3 thin liquids   Discharge instructions   Complete by:  As  directed    Take medications as prescribed.   Increase activity slowly   Complete by:  As directed      Allergies as of 12/29/2018      Reactions   Penicillins Other (See Comments)   Tolerated Ceftriaxone 04/2018 Other reaction(s): UNKNOWN REACTION      Medication List    STOP taking these medications   polyethylene glycol packet Commonly known as:  MIRALAX / GLYCOLAX     TAKE these medications   acetaminophen 325 MG tablet Commonly known as:  TYLENOL Take 650 mg by mouth every 6 (six) hours as needed for mild pain.   amLODipine 10 MG tablet Commonly known as:  NORVASC Take 10 mg by mouth daily.   aspirin EC 81 MG EC tablet Generic drug:  aspirin Take 81 mg by mouth daily. Swallow whole.   bisacodyl 10 MG suppository Commonly known as:  DULCOLAX Place 10 mg rectally daily as needed for moderate constipation (if not relieved by MOM).   bisacodyl 10 MG/30ML Enem Commonly known as:  FLEET Place 10 mg rectally daily as needed (if not relieved by Bisacodyl suppository).   calcium carbonate 1250 (500 Ca) MG chewable tablet Commonly known as:  OS-CAL Chew 1 tablet by mouth daily.   D3-1000 25 MCG (1000 UT) tablet Generic drug:  Cholecalciferol Take 1,000 Units by mouth daily.   doxycycline 100 MG capsule Commonly known as:  VIBRAMYCIN Take 1 capsule (100 mg total) by mouth 2 (two) times daily for 2 days.   FISH OIL PO Take 5 mLs by mouth daily. 1,600 MG/5 ML LIQUID- take 5 ml po daily as supplement   hydrALAZINE 25 MG tablet Commonly known as:  APRESOLINE Take 3 tablets (75 mg total) by mouth every 8 (eight) hours. What changed:    medication strength  how much to take  when to take this   levETIRAcetam 100 MG/ML solution Commonly known as:  KEPPRA Take 5 mLs (500 mg total) by mouth 2 (two) times daily.   MILK OF MAGNESIA PO Take 30 mLs by mouth daily as needed (if no BM in 3 days).   Namenda XR 28 MG Cp24 24 hr capsule Generic drug:  memantine Take  28 mg by mouth at bedtime.   Nuedexta 20-10 MG capsule Generic drug:  Dextromethorphan-quiNIDine Take 1 capsule by mouth daily.   NUTRITIONAL SUPPLEMENTS PO Initiate 120 ml Medpass 3 times daily with meals related to weight loss   feeding supplement (ENSURE ENLIVE) Liqd Take 237 mLs by mouth 4 (four) times daily.   pravastatin 40 MG tablet Commonly known as:  PRAVACHOL Take 40 mg by mouth daily.   rivastigmine 6 MG capsule Commonly known as:  EXELON Take 6 mg by mouth 2 (two) times daily.   vitamin B-12 1000 MCG tablet Commonly known as:  CYANOCOBALAMIN Take 1,000 mcg by mouth daily.      Allergies  Allergen Reactions  . Penicillins Other (See Comments)    Tolerated Ceftriaxone 04/2018 Other reaction(s): UNKNOWN REACTION        The results of significant diagnostics from this hospitalization (including imaging, microbiology, ancillary and laboratory) are listed below for reference.    Significant Diagnostic Studies: Ct Abdomen Pelvis W  Contrast  Result Date: 12/27/2018 CLINICAL DATA:  Failure to thrive. Projectile vomiting after eating. EXAM: CT ABDOMEN AND PELVIS WITH CONTRAST TECHNIQUE: Multidetector CT imaging of the abdomen and pelvis was performed using the standard protocol following bolus administration of intravenous contrast. CONTRAST:  OMNIPAQUE IOHEXOL 300 MG/ML  SOLN COMPARISON:  Report from 01/17/2010 CT FINDINGS: Lower chest: Normal heart size without pericardial effusion or thickening. Patchy bibasilar airspace disease consistent with multilobar pneumonia. No effusion or pneumothorax. Hepatobiliary: Tiny subcentimeter scattered hypodensities are noted within the liver compatible with cysts, biliary hamartomas or small hemangiomas. Status post cholecystectomy. No choledocholithiasis. Reservoir effect with intrahepatic and extrahepatic ductal dilatation likely on the basis of prior cholecystectomy. Pancreas: No ductal dilatation, mass or inflammation.  Spleen: Normal Adrenals/Urinary Tract: Thickening of the adrenal glands. Small cystic nodule of the right adrenal gland measuring 7 mm possibly a small adenoma but is too small to further characterize. Cortical scarring within both kidneys subcentimeter cyst associated with the lower pole the right kidney and interpolar left kidney. No nephrolithiasis nor obstructive uropathy. The urinary bladder is decompressed. Stomach/Bowel: The stomach is physiologically distended without mucosal thickening. Normal small bowel rotation without obstruction. Significant stool retention throughout the colon consistent constipation with probable fecal impaction in the rectum. Unremarkable appendix. Vascular/Lymphatic: Moderate aortoiliac atherosclerosis without aneurysm. No lymphadenopathy. Reproductive: Status post hysterectomy. No adnexal masses. Other: No free air free fluid.  Mild soft tissue anasarca. Musculoskeletal: Mild grade 1 anterolisthesis of L4 on L5 likely on the basis of degenerative disc disease and facet arthropathy. Vacuum disc phenomenon noted at L3-4 and L4-5. No acute fracture or aggressive osseous lesions. IMPRESSION: 1. Patchy multilobar airspace disease at the lung bases consistent multilobar pneumonia. Stigmata of CHF believed less likely given lack of significant pleural effusion or cardiomegaly. 2. Significant stool retention throughout the colon consistent constipation with probable fecal impaction. 3. Tiny too small to characterize cysts of the liver and both kidneys. Electronically Signed   By: Tollie Eth M.D.   On: 12/27/2018 01:02   Dg Swallowing Func-speech Pathology  Result Date: 12/28/2018 Objective Swallowing Evaluation: Type of Study: MBS-Modified Barium Swallow Study Completed by Gardiner Ramus, SLP Student Supervised in line of sight and reviewed by Harlon Ditty MA CCC-SLP  Patient Details Name: AIJALON DEMURO MRN: [id number] Date of Birth: 1941-10-01 Today's Date: 12/28/2018 Time: SLP Start  Time (ACUTE ONLY): 1125 -SLP Stop Time (ACUTE ONLY): 1155 SLP Time Calculation (min) (ACUTE ONLY): 30 min Past Medical History: Past Medical History: Diagnosis Date . Alzheimer's disease (HCC)  . Chronic constipation  . Depressed  . Failure to thrive in adult  . HCAP (healthcare-associated pneumonia)  . Hyperlipidemia  . Hypernatremia  . Hypertension  . Hypokalemia  . Low back pain  . Migraines  . Osteoarthritis  . Osteoporosis  . PBA (pseudobulbar affect)  . Post-ictal state (HCC)  . Seizures (HCC)  . Urinary tract infection with hematuria  . Vitamin B12 deficiency  Past Surgical History: Past Surgical History: Procedure Laterality Date . ABDOMINAL HYSTERECTOMY   . CHOLECYSTECTOMY   . KNEE SURGERY    total HPI: Melissa Jarvis is a 78 y.o. 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  Patient apparently had 1 episode of emesis at her facility and was sent to the ER for further evaluation.  At baseline, she is non-verbal and bedbound. CT 3/12 revealed "Patchy multilobar airspace disease at the lung bases consistent multilobar pneumonia.  Stigmata of CHF believed less likely given lack of significant pleural effusion or cardiomegaly."  Subjective: Pt lethargic, eyes closed throughout session, does not follow directions without heavy cues Assessment / Plan / Recommendation CHL IP CLINICAL IMPRESSIONS 12/28/2018 Clinical Impression Pt presents with primary oral dysphagia most likely secondary to baseline dementia. Pt very lethargic, with eyes closed throughout testing. Max question, verbal, and tactile stimulation cues provided to increase alertness with no success. Though lethargic looking, bolus touched to lips triggered mouth opening and acceptance of trials. Oral holding, reduced lingual coordination and bolus formulation observed with subsequent premature spillage of all POs into the vallecula and pyriform sinuses. She required  max verbal/tactile cues to swallow all POs throughout session with pill ultimately having to be manually removed from oral cavity. No aspiration noted, but flash penetration observed x2 ocurred during thin liquid intake via cup/straw. Strength of laryngeal closure adequate for clearing penetrate. Recommend advancement to dys 3, thin with meds crushed in puree. Pt requires full supervision for cues to swallow. SLP to f/u for tolerance and to provide strategies and education as indicated. SLP Visit Diagnosis Dysphagia, oral phase (R13.11) Attention and concentration deficit following -- Frontal lobe and executive function deficit following -- Impact on safety and function Mild aspiration risk   CHL IP TREATMENT RECOMMENDATION 12/28/2018 Treatment Recommendations Therapy as outlined in treatment plan below   Prognosis 12/28/2018 Prognosis for Safe Diet Advancement Good Barriers to Reach Goals Cognitive deficits Barriers/Prognosis Comment -- CHL IP DIET RECOMMENDATION 12/28/2018 SLP Diet Recommendations Dysphagia 3 (Mech soft) solids;Thin liquid Liquid Administration via Cup Medication Administration Crushed with puree Compensations Minimize environmental distractions;Slow rate;Small sips/bites Postural Changes Remain semi-upright after after feeds/meals (Comment);Seated upright at 90 degrees   CHL IP OTHER RECOMMENDATIONS 12/28/2018 Recommended Consults -- Oral Care Recommendations Oral care BID Other Recommendations --   CHL IP FOLLOW UP RECOMMENDATIONS 12/28/2018 Follow up Recommendations Skilled Nursing facility   [facility] IP FREQUENCY AND DURATION 12/28/2018 Speech Therapy Frequency (ACUTE ONLY) min 2x/week Treatment Duration --      CHL IP ORAL PHASE 12/28/2018 Oral Phase Impaired Oral - Pudding Teaspoon -- Oral - Pudding Cup -- Oral - Honey Teaspoon -- Oral - Honey Cup -- Oral - Nectar Teaspoon -- Oral - Nectar Cup -- Oral - Nectar Straw -- Oral - Thin Teaspoon -- Oral - Thin Cup Weak lingual manipulation;Lingual  pumping;Premature spillage;Decreased bolus cohesion Oral - Thin Straw Weak lingual manipulation;Lingual pumping;Premature spillage;Decreased bolus cohesion Oral - Puree Weak lingual manipulation;Lingual pumping;Premature spillage;Decreased bolus cohesion Oral - Mech Soft -- Oral - Regular Weak lingual manipulation;Lingual pumping;Premature spillage;Decreased bolus cohesion Oral - Multi-Consistency -- Oral - Pill Holding of bolus Oral Phase - Comment --  CHL IP PHARYNGEAL PHASE 12/28/2018 Pharyngeal Phase Impaired Pharyngeal- Pudding Teaspoon -- Pharyngeal -- Pharyngeal- Pudding Cup -- Pharyngeal -- Pharyngeal- Honey Teaspoon -- Pharyngeal -- Pharyngeal- Honey Cup -- Pharyngeal -- Pharyngeal- Nectar Teaspoon -- Pharyngeal -- Pharyngeal- Nectar Cup -- Pharyngeal -- Pharyngeal- Nectar Straw -- Pharyngeal -- Pharyngeal- Thin Teaspoon -- Pharyngeal -- Pharyngeal- Thin Cup Penetration/Aspiration during swallow Pharyngeal Material enters airway, remains ABOVE vocal cords then ejected out Pharyngeal- Thin Straw Penetration/Aspiration during swallow Pharyngeal Material enters airway, remains ABOVE vocal cords then ejected out Pharyngeal- Puree -- Pharyngeal -- Pharyngeal- Mechanical Soft -- Pharyngeal -- Pharyngeal- Regular -- Pharyngeal -- Pharyngeal- Multi-consistency -- Pharyngeal -- Pharyngeal- Pill -- Pharyngeal -- Pharyngeal Comment --  CHL IP CERVICAL ESOPHAGEAL PHASE 12/28/2018 Cervical Esophageal Phase (No Data) Pudding Teaspoon -- Pudding Cup -- Honey  Teaspoon -- Honey Cup -- Nectar Teaspoon -- Nectar Cup -- Nectar Straw -- Thin Teaspoon -- Thin Cup -- Thin Straw -- Puree -- Mechanical Soft -- Regular -- Multi-consistency -- Pill -- Cervical Esophageal Comment -- DeBlois, Riley Nearing 12/28/2018, 1:36 PM               Microbiology: Recent Results (from the past 240 hour(s))  MRSA PCR Screening     Status: None   Collection Time: 12/27/18  6:41 PM  Result Value Ref Range Status   MRSA by PCR NEGATIVE  NEGATIVE Final    Comment:        The GeneXpert MRSA Assay (FDA approved for NASAL specimens only), is one component of a comprehensive MRSA colonization surveillance program. It is not intended to diagnose MRSA infection nor to guide or monitor treatment for MRSA infections. Performed at [facility], [address], [place], [place] [address]      Labs: Basic Metabolic Panel: Recent Labs  Lab 12/26/18 2332 12/28/18 0549 12/29/18 0554  NA 146* 147* 147*  K 3.6 3.5 3.2*  CL 109 115* 111  CO2 27 27 27   GLUCOSE 122* 92 100*  BUN 22 13 6*  CREATININE 0.74 0.55 0.42*  CALCIUM 9.5 8.8* 9.4   Liver Function Tests: Recent Labs  Lab 12/26/18 2332  AST 20  ALT 12  ALKPHOS 78  BILITOT 0.5  PROT 6.9  ALBUMIN 3.5   Recent Labs  Lab 12/26/18 2332  LIPASE 36   No results for input(s): AMMONIA in the last 168 hours. CBC: Recent Labs  Lab 12/26/18 2332 12/28/18 0549  WBC 4.3 8.4  HGB 14.3 11.5*  HCT 45.5 35.8*  MCV 98.5 97.3  PLT 133* 102*   Cardiac Enzymes: No results for input(s): CKTOTAL, CKMB, CKMBINDEX, TROPONINI in the last 168 hours. BNP: BNP (last 3 results) No results for input(s): BNP in the last 8760 hours.  ProBNP (last 3 results) No results for input(s): PROBNP in the last 8760 hours.  CBG: Recent Labs  Lab 12/27/18 0008  GLUCAP 122*       Signed:  Gwenyth Bender NP  [facility] 12/29/2018, 11:24 AM  Patient was seen, examined,treatment plan was discussed with the Advance Practice Provider.  I have personally reviewed the clinical findings, labs, EKG, imaging studies and management of this patient in detail. I have also reviewed the orders written for this patient which were under my direction. I agree with the documentation, as recorded by the Advance Practice Provider.   GEAN STEFANOWICZ is a 78 y.o. female here with constipation and possible pna.  Both treated needs long term plan for dementia.  Joseph Art,  DO   How to contact the [facility] Attending or Consulting provider 7A - 7P or covering provider during after hours 7P -7A, for this patient?  1. Check the care team in [facility] and look for a) attending/consulting TRH provider listed and b) the [facility] team listed 2. Log into [email or web address] and use [place]'s universal password to access. If you do not have the password, please contact the hospital operator. 3. Locate the [facility] provider you are looking for under [facility] and page to a number that you can be directly reached. 4. If you still have difficulty reaching the provider, please page the [facility] (Director on Call) for the Hospitalists listed on amion for assistance.

## 2018-12-29 NOTE — Social Work (Addendum)
Clinical Social Worker facilitated patient discharge including contacting patient family and facility to confirm patient discharge plans.  Clinical information faxed to facility and family agreeable with plan.  Pt will require ambulance transport via [facility] to [facility]  Please call [facility] at [phone] Press #1 then #3 to arrange pick up.  RN to call [phone]  with report prior to discharge.  Clinical Social Worker will sign off for now as social work intervention is no longer needed. Please consult [place] again if new need arises.  Octavio Graves, MSW, [facility] Clinical Social Worker [phone]

## 2018-12-29 NOTE — TOC Transition Note (Signed)
Transition of Care [facility]) - CM/SW Discharge Note   Patient Details  Name: Melissa Jarvis MRN: [id number] Date of Birth: 1941-06-22  Transition of Care [facility]) CM/SW Contact:  Doy Hutching, LCSWA Phone Number: 12/29/2018, 11:36 AM   Clinical Narrative:    Pt will be discharged back to LTC SNF [facility] today.  Final next level of care: Skilled Nursing Facility Barriers to Discharge: Barriers Resolved   Patient Goals and CMS Choice Patient states their goals for this hospitalization and ongoing recovery are:: "to know she's okay"- pt daughter [name] CMS Medicare.gov Compare Post Acute Care list provided to:: Patient Choice offered to / list presented to : Adult Children  Discharge Placement   Existing PASRR number confirmed : 12/29/18          Patient chooses bed at: [facility] Patient to be transferred to facility by: [facility] Name of family member notified: pt daughter [name] Patient and family notified of of transfer: 12/29/18  Discharge Plan and Services   Post Acute Care Choice: Skilled Nursing Facility                    Social Determinants of Health (SDOH) Interventions     Readmission Risk Interventions No flowsheet data found.

## 2018-12-31 ENCOUNTER — Non-Acute Institutional Stay (SKILLED_NURSING_FACILITY): Payer: Medicare Other | Admitting: Internal Medicine

## 2018-12-31 ENCOUNTER — Encounter: Payer: Self-pay | Admitting: Internal Medicine

## 2018-12-31 DIAGNOSIS — R569 Unspecified convulsions: Secondary | ICD-10-CM

## 2018-12-31 DIAGNOSIS — R488 Other symbolic dysfunctions: Secondary | ICD-10-CM | POA: Diagnosis not present

## 2018-12-31 DIAGNOSIS — R627 Adult failure to thrive: Secondary | ICD-10-CM | POA: Diagnosis not present

## 2018-12-31 DIAGNOSIS — R1311 Dysphagia, oral phase: Secondary | ICD-10-CM | POA: Diagnosis not present

## 2018-12-31 DIAGNOSIS — M6281 Muscle weakness (generalized): Secondary | ICD-10-CM | POA: Diagnosis not present

## 2018-12-31 DIAGNOSIS — J189 Pneumonia, unspecified organism: Secondary | ICD-10-CM

## 2018-12-31 DIAGNOSIS — R1112 Projectile vomiting: Secondary | ICD-10-CM | POA: Diagnosis not present

## 2018-12-31 DIAGNOSIS — I69828 Other speech and language deficits following other cerebrovascular disease: Secondary | ICD-10-CM | POA: Diagnosis not present

## 2018-12-31 DIAGNOSIS — K5909 Other constipation: Secondary | ICD-10-CM | POA: Diagnosis not present

## 2018-12-31 DIAGNOSIS — F0390 Unspecified dementia without behavioral disturbance: Secondary | ICD-10-CM

## 2018-12-31 DIAGNOSIS — E870 Hyperosmolality and hypernatremia: Secondary | ICD-10-CM | POA: Diagnosis not present

## 2018-12-31 DIAGNOSIS — I10 Essential (primary) hypertension: Secondary | ICD-10-CM

## 2018-12-31 DIAGNOSIS — I69891 Dysphagia following other cerebrovascular disease: Secondary | ICD-10-CM | POA: Diagnosis not present

## 2018-12-31 NOTE — Progress Notes (Signed)
:   Location:  [facility] Room Number: 203D Place of Service:  SNF (31)  Randon Goldsmith. Lyn Hollingshead, MD  Patient Care Team: [name], MD as PCP - General (Internal Medicine)  Extended Emergency Contact Information Primary Emergency Contact: [name],[name] Address: [address]          [place], [place] [address] [place] Home Phone: [phone] Relation: Daughter Secondary Emergency Contact: [name] Mobile Phone: [phone] Relation: Daughter     Allergies: Penicillins  Chief Complaint  Patient presents with   New Admit To SNF    Admit to [facility]    HPI: Patient is 78 y.o. female with dementia, seizure, hypertension, hyperlipidemia, who presented from SNF with vomiting 1 episode.  Daughter who was present the ED with her all night had little additional information available.  Patient was found to be hypoxic on arrival to the emergency department so there is concern for aspiration.  CT showed multifocal pneumonia likely aspiration.  Was treated with IV then p.o.  Patient was found to have constipation, probably the cause of her emesis and with milk of molasses enema she had return of a massive amount of stool.Marland Kitchen  Hospital course was further complicated by hypernatremia which she has had prior, likely related to dehydration which improved prior to discharge.  All other problems being stable patient is admitted back to SNF for OT/PT and supportive care.  While skilled nursing facility patient will be followed for hypertension treated with Norvasc and hydralazine, seizure disorder treated with Keppra and dementia treated with Namenda and Exelon.   Past Medical History:  Diagnosis Date   Alzheimer's disease (HCC)    Chronic constipation    Depressed    Failure to thrive in adult    HCAP (healthcare-associated pneumonia)    Hyperlipidemia    Hypernatremia    Hypertension    Hypokalemia    Low back pain     Migraines    Osteoarthritis    Osteoporosis    PBA (pseudobulbar affect)    Post-ictal state (HCC)    Seizures (HCC)    Urinary tract infection with hematuria    Vitamin B12 deficiency     Past Surgical History:  Procedure Laterality Date   ABDOMINAL HYSTERECTOMY     CHOLECYSTECTOMY     KNEE SURGERY     total    Allergies as of 12/31/2018      Reactions   Penicillins Other (See Comments)   Tolerated Ceftriaxone 04/2018 Other reaction(s): UNKNOWN REACTION      Medication List       Accurate as of December 31, 2018  2:26 PM. Always use your most recent med list.        acetaminophen 325 MG tablet Commonly known as:  TYLENOL Take 650 mg by mouth every 6 (six) hours as needed for mild pain.   amLODipine 10 MG tablet Commonly known as:  NORVASC Take 10 mg by mouth daily.   aspirin EC 81 MG EC tablet Generic drug:  aspirin Take 81 mg by mouth daily. Swallow whole.   bisacodyl 10 MG suppository Commonly known as:  DULCOLAX Place 10 mg rectally daily as needed for moderate constipation (if not relieved by MOM).   bisacodyl 10 MG/30ML Enem Commonly known as:  FLEET Place 10 mg rectally daily as needed (if not relieved by Bisacodyl suppository).   calcium carbonate 1250 (500 Ca) MG chewable tablet Commonly known as:  OS-CAL Chew  1 tablet by mouth daily.   D3-1000 25 MCG (1000 UT) tablet Generic drug:  Cholecalciferol Take 1,000 Units by mouth daily.   FISH OIL PO Take 5 mLs by mouth daily. 1,600 MG/5 ML LIQUID- take 5 ml po daily as supplement   hydrALAZINE 25 MG tablet Commonly known as:  APRESOLINE Take 3 tablets (75 mg total) by mouth every 8 (eight) hours.   levETIRAcetam 100 MG/ML solution Commonly known as:  KEPPRA Take 5 mLs (500 mg total) by mouth 2 (two) times daily.   MILK OF MAGNESIA PO Take 30 mLs by mouth daily as needed (if no BM in 3 days).   Namenda XR 28 MG Cp24 24 hr capsule Generic drug:  memantine Take 28 mg by mouth at  bedtime.   Nuedexta 20-10 MG capsule Generic drug:  Dextromethorphan-quiNIDine Take 1 capsule by mouth daily.   NUTRITIONAL SUPPLEMENTS PO Initiate 120 ml Medpass 3 times daily with meals related to weight loss   feeding supplement (ENSURE ENLIVE) Liqd Take 237 mLs by mouth 4 (four) times daily.   pravastatin 40 MG tablet Commonly known as:  PRAVACHOL Take 40 mg by mouth daily.   rivastigmine 6 MG capsule Commonly known as:  EXELON Take 6 mg by mouth 2 (two) times daily.   vitamin B-12 1000 MCG tablet Commonly known as:  CYANOCOBALAMIN Take 1,000 mcg by mouth daily.       No orders of the defined types were placed in this encounter.   Immunization History  Administered Date(s) Administered   Influenza, High Dose Seasonal PF 08/01/2016   Influenza-Unspecified 07/31/2017, 07/05/2018   PPD Test 12/09/2017   Pneumococcal Conjugate-13 08/01/2016   Tdap 12/16/2014    Social History   Tobacco Use   Smoking status: Former Smoker   Smokeless tobacco: Never Used  Substance Use Topics   Alcohol use: No    Family history is   Family History  Problem Relation Age of Onset   Hypertension Mother    Peripheral vascular disease Mother        Bilateral leg amputations   Alzheimer's disease Father    Stroke Brother    Heart attack Brother       Review of Systems  DATA OBTAINED: from nurse GENERAL:  no fevers, fatigue, appetite changes SKIN: No itching, or rash EYES: No eye pain, redness, discharge EARS: No earache, tinnitus, change in hearing NOSE: No congestion, drainage or bleeding  MOUTH/THROAT: No mouth or tooth pain, No sore throat RESPIRATORY: No cough, wheezing, SOB CARDIAC: No chest pain, palpitations, lower extremity edema  GI: No abdominal pain, No N/V/D or constipation, No heartburn or reflux  GU: No dysuria, frequency or urgency, or incontinence  MUSCULOSKELETAL: No unrelieved bone/joint pain NEUROLOGIC: No headache, dizziness or focal  weakness PSYCHIATRIC: No c/o anxiety or sadness   Vitals:   12/31/18 1418  BP: 115/74  Pulse: 70  Resp: 18  Temp: 98 F (36.7 C)    SpO2 Readings from Last 1 Encounters:  12/29/18 100%   Body mass index is 24.89 kg/m.     Physical Exam  GENERAL APPEARANCE: Alert,  No acute distress.  SKIN: No diaphoresis rash HEAD: Normocephalic, atraumatic  EYES: Conjunctiva/lids clear. Pupils round, reactive. EOMs intact.  EARS: External exam WNL, canals clear. Hearing grossly normal.  NOSE: No deformity or discharge.  MOUTH/THROAT: Lips w/o lesions  RESPIRATORY: Breathing is even, unlabored. Lung sounds are clear   CARDIOVASCULAR: Heart RRR no murmurs, rubs or gallops. No peripheral edema.  GASTROINTESTINAL: Abdomen is soft, non-tender, not distended w/ normal bowel sounds. GENITOURINARY: Bladder non tender, not distended  MUSCULOSKELETAL: No abnormal joints or musculature NEUROLOGIC:  Cranial nerves 2-12 grossly intact. Moves all extremities  PSYCHIATRIC: Flat affect, no behavioral issues  Patient Active Problem List   Diagnosis Date Noted   Malnutrition of moderate degree 12/28/2018   Palliative care by specialist    Goals of care, counseling/discussion    Emesis 12/27/2018   Enterococcus UTI 04/29/2018   CAP (community acquired pneumonia) 04/25/2018   Multifocal pneumonia 04/22/2018   Post-ictal state (HCC) 02/18/2018   Urinary tract infection with hematuria    Seizure (HCC) 02/11/2018   Chronic constipation 02/09/2018   Dementia (HCC) 12/16/2017   Failure to thrive in adult 12/16/2017   Hypernatremia 12/16/2017   Hypokalemia 12/16/2017   Hypertension 12/16/2017   PBA (pseudobulbar affect) 12/16/2017   Hyperlipidemia 12/16/2017   Osteoporosis 12/16/2017   Vitamin B12 deficiency 12/16/2017   Depression 12/16/2017      Labs reviewed: Basic Metabolic Panel:    Component Value Date/Time   NA 147 (H) 12/29/2018 0554   NA 146 07/27/2018   K  3.2 (L) 12/29/2018 0554   CL 111 12/29/2018 0554   CO2 27 12/29/2018 0554   GLUCOSE 100 (H) 12/29/2018 0554   BUN 6 (L) 12/29/2018 0554   BUN 11 07/27/2018   CREATININE 0.42 (L) 12/29/2018 0554   CALCIUM 9.4 12/29/2018 0554   PROT 6.9 12/26/2018 2332   ALBUMIN 3.5 12/26/2018 2332   AST 20 12/26/2018 2332   ALT 12 12/26/2018 2332   ALKPHOS 78 12/26/2018 2332   BILITOT 0.5 12/26/2018 2332   GFRNONAA >60 12/29/2018 0554   GFRAA >60 12/29/2018 0554    Recent Labs    12/26/18 2332 12/28/18 0549 12/29/18 0554  NA 146* 147* 147*  K 3.6 3.5 3.2*  CL 109 115* 111  CO2 27 27 27   GLUCOSE 122* 92 100*  BUN 22 13 6*  CREATININE 0.74 0.55 0.42*  CALCIUM 9.5 8.8* 9.4   Liver Function Tests: Recent Labs    04/22/18 2034 04/23/18 0822 12/26/18 2332  AST 20 23 20   ALT 11 10 12   ALKPHOS 77 73 78  BILITOT 0.9 1.2 0.5  PROT 6.7 5.8* 6.9  ALBUMIN 3.3* 2.9* 3.5   Recent Labs    04/22/18 2034 12/26/18 2332  LIPASE 38 36   No results for input(s): AMMONIA in the last 8760 hours. CBC: Recent Labs    02/11/18 1325  04/22/18 2034  04/23/18 0822  07/22/18 12/26/18 2332 12/28/18 0549  WBC 7.5   7.5   < > 7.1  --  7.9   < > 7.2 4.3 8.4  NEUTROABS 3.6  --  3.8  --   --   --  6  --   --   HGB 13.2   13.2   < > 13.5   < > 13.1   < > 15.4 14.3 11.5*  HCT 41.2   41   < > 42.4   < > 39.8   < > 46 45.5 35.8*  MCV 98.3   < > 96.8  --  93.4  --   --  98.5 97.3  PLT PLATELET CLUMPS NOTED ON SMEAR, COUNT APPEARS ADEQUATE   < > 153  --  117*   < > 120* 133* 102*   < > = values in this interval not displayed.   Lipid Recent Labs    02/12/18 0521  CHOL 152  HDL 49  LDLCALC 99  TRIG 21    Cardiac Enzymes: No results for input(s): CKTOTAL, CKMB, CKMBINDEX, TROPONINI in the last 8760 hours. BNP: No results for input(s): BNP in the last 8760 hours. No results found for: [facility] Lab Results  Component Value Date   HGBA1C 4.2 (L) 02/12/2018   Lab Results  Component Value Date     TSH 1.25 07/23/2018   No results found for: VITAMINB12 No results found for: FOLATE No results found for: IRON, TIBC, FERRITIN  Imaging and Procedures obtained prior to SNF admission: Ct Abdomen Pelvis W Contrast  Result Date: 12/27/2018 CLINICAL DATA:  Failure to thrive. Projectile vomiting after eating. EXAM: CT ABDOMEN AND PELVIS WITH CONTRAST TECHNIQUE: Multidetector CT imaging of the abdomen and pelvis was performed using the standard protocol following bolus administration of intravenous contrast. CONTRAST:  OMNIPAQUE IOHEXOL 300 MG/ML  SOLN COMPARISON:  Report from 01/17/2010 CT FINDINGS: Lower chest: Normal heart size without pericardial effusion or thickening. Patchy bibasilar airspace disease consistent with multilobar pneumonia. No effusion or pneumothorax. Hepatobiliary: Tiny subcentimeter scattered hypodensities are noted within the liver compatible with cysts, biliary hamartomas or small hemangiomas. Status post cholecystectomy. No choledocholithiasis. Reservoir effect with intrahepatic and extrahepatic ductal dilatation likely on the basis of prior cholecystectomy. Pancreas: No ductal dilatation, mass or inflammation. Spleen: Normal Adrenals/Urinary Tract: Thickening of the adrenal glands. Small cystic nodule of the right adrenal gland measuring 7 mm possibly a small adenoma but is too small to further characterize. Cortical scarring within both kidneys subcentimeter cyst associated with the lower pole the right kidney and interpolar left kidney. No nephrolithiasis nor obstructive uropathy. The urinary bladder is decompressed. Stomach/Bowel: The stomach is physiologically distended without mucosal thickening. Normal small bowel rotation without obstruction. Significant stool retention throughout the colon consistent constipation with probable fecal impaction in the rectum. Unremarkable appendix. Vascular/Lymphatic: Moderate aortoiliac atherosclerosis without aneurysm. No  lymphadenopathy. Reproductive: Status post hysterectomy. No adnexal masses. Other: No free air free fluid.  Mild soft tissue anasarca. Musculoskeletal: Mild grade 1 anterolisthesis of L4 on L5 likely on the basis of degenerative disc disease and facet arthropathy. Vacuum disc phenomenon noted at L3-4 and L4-5. No acute fracture or aggressive osseous lesions. IMPRESSION: 1. Patchy multilobar airspace disease at the lung bases consistent multilobar pneumonia. Stigmata of CHF believed less likely given lack of significant pleural effusion or cardiomegaly. 2. Significant stool retention throughout the colon consistent constipation with probable fecal impaction. 3. Tiny too small to characterize cysts of the liver and both kidneys. Electronically Signed   By: Tollie Eth M.D.   On: 12/27/2018 01:02     Not all labs, radiology exams or other studies done during hospitalization come through on my EPIC note; however they are reviewed by me.    Assessment and Plan  Multifocal pneumonia- without reported symptoms of pneumonia but found to be hypoxic on arrival to the ED; she did have 1 episode of emesis at her facility before arrival; CT showed  multifocal pneumonia; was treated with Rocephin for 3 days and will be discharged with doxycycline for 2 more SNF- admitted back for OT/PT and residential care; continue doxycycline for 2 more days  Emesis-likely due to constipation; patient had milk of molasses enema with a massive amount of stool in hospital SNF- continue Colace  Hypernatremia-resolved with IV fluids SNF- follow-up BMP  Seizure disorder patient with known seizure disorder and had 1-2 seizures prior to admission; loaded with Keppra per neurology; neurology  discussed the case and opined that her seizure threshold was lowered due to active infection SNF- Keppra 500 mg twice daily  Dementia SNF- poor quality of life; family desires patient continued you Namenda XR 20 mg daily and Exelon 6 mg twice  daily  Hypertension SNF- controlled; continue hydralazine 75 mg every 8 hours and Norvasc 10 mg daily   Time spent greater than 45 minutes;> 50% of time with patient was spent reviewing records, labs, tests and studies, counseling and developing plan of care  Thurston Hole D. Lyn Hollingshead, MD

## 2019-01-01 ENCOUNTER — Encounter: Payer: Self-pay | Admitting: Internal Medicine

## 2019-01-01 DIAGNOSIS — I69828 Other speech and language deficits following other cerebrovascular disease: Secondary | ICD-10-CM | POA: Diagnosis not present

## 2019-01-01 DIAGNOSIS — I69891 Dysphagia following other cerebrovascular disease: Secondary | ICD-10-CM | POA: Diagnosis not present

## 2019-01-01 DIAGNOSIS — R488 Other symbolic dysfunctions: Secondary | ICD-10-CM | POA: Diagnosis not present

## 2019-01-01 DIAGNOSIS — J189 Pneumonia, unspecified organism: Secondary | ICD-10-CM | POA: Diagnosis not present

## 2019-01-01 DIAGNOSIS — R1311 Dysphagia, oral phase: Secondary | ICD-10-CM | POA: Diagnosis not present

## 2019-01-01 DIAGNOSIS — M6281 Muscle weakness (generalized): Secondary | ICD-10-CM | POA: Diagnosis not present

## 2019-01-01 DIAGNOSIS — R627 Adult failure to thrive: Secondary | ICD-10-CM | POA: Diagnosis not present

## 2019-01-02 DIAGNOSIS — M6281 Muscle weakness (generalized): Secondary | ICD-10-CM | POA: Diagnosis not present

## 2019-01-02 DIAGNOSIS — R627 Adult failure to thrive: Secondary | ICD-10-CM | POA: Diagnosis not present

## 2019-01-02 DIAGNOSIS — I69828 Other speech and language deficits following other cerebrovascular disease: Secondary | ICD-10-CM | POA: Diagnosis not present

## 2019-01-02 DIAGNOSIS — J189 Pneumonia, unspecified organism: Secondary | ICD-10-CM | POA: Diagnosis not present

## 2019-01-02 DIAGNOSIS — I69891 Dysphagia following other cerebrovascular disease: Secondary | ICD-10-CM | POA: Diagnosis not present

## 2019-01-02 DIAGNOSIS — R488 Other symbolic dysfunctions: Secondary | ICD-10-CM | POA: Diagnosis not present

## 2019-01-02 DIAGNOSIS — R1311 Dysphagia, oral phase: Secondary | ICD-10-CM | POA: Diagnosis not present

## 2019-01-03 DIAGNOSIS — I69828 Other speech and language deficits following other cerebrovascular disease: Secondary | ICD-10-CM | POA: Diagnosis not present

## 2019-01-03 DIAGNOSIS — J189 Pneumonia, unspecified organism: Secondary | ICD-10-CM | POA: Diagnosis not present

## 2019-01-03 DIAGNOSIS — I69891 Dysphagia following other cerebrovascular disease: Secondary | ICD-10-CM | POA: Diagnosis not present

## 2019-01-03 DIAGNOSIS — R1311 Dysphagia, oral phase: Secondary | ICD-10-CM | POA: Diagnosis not present

## 2019-01-03 DIAGNOSIS — R627 Adult failure to thrive: Secondary | ICD-10-CM | POA: Diagnosis not present

## 2019-01-03 DIAGNOSIS — M6281 Muscle weakness (generalized): Secondary | ICD-10-CM | POA: Diagnosis not present

## 2019-01-03 DIAGNOSIS — R488 Other symbolic dysfunctions: Secondary | ICD-10-CM | POA: Diagnosis not present

## 2019-01-04 DIAGNOSIS — R488 Other symbolic dysfunctions: Secondary | ICD-10-CM | POA: Diagnosis not present

## 2019-01-04 DIAGNOSIS — R1311 Dysphagia, oral phase: Secondary | ICD-10-CM | POA: Diagnosis not present

## 2019-01-04 DIAGNOSIS — I69828 Other speech and language deficits following other cerebrovascular disease: Secondary | ICD-10-CM | POA: Diagnosis not present

## 2019-01-04 DIAGNOSIS — R001 Bradycardia, unspecified: Secondary | ICD-10-CM | POA: Diagnosis not present

## 2019-01-04 DIAGNOSIS — M6281 Muscle weakness (generalized): Secondary | ICD-10-CM | POA: Diagnosis not present

## 2019-01-04 DIAGNOSIS — J189 Pneumonia, unspecified organism: Secondary | ICD-10-CM | POA: Diagnosis not present

## 2019-01-04 DIAGNOSIS — R627 Adult failure to thrive: Secondary | ICD-10-CM | POA: Diagnosis not present

## 2019-01-04 DIAGNOSIS — I69891 Dysphagia following other cerebrovascular disease: Secondary | ICD-10-CM | POA: Diagnosis not present

## 2019-01-07 DIAGNOSIS — M6281 Muscle weakness (generalized): Secondary | ICD-10-CM | POA: Diagnosis not present

## 2019-01-07 DIAGNOSIS — R627 Adult failure to thrive: Secondary | ICD-10-CM | POA: Diagnosis not present

## 2019-01-07 DIAGNOSIS — R1311 Dysphagia, oral phase: Secondary | ICD-10-CM | POA: Diagnosis not present

## 2019-01-07 DIAGNOSIS — R488 Other symbolic dysfunctions: Secondary | ICD-10-CM | POA: Diagnosis not present

## 2019-01-07 DIAGNOSIS — I69891 Dysphagia following other cerebrovascular disease: Secondary | ICD-10-CM | POA: Diagnosis not present

## 2019-01-07 DIAGNOSIS — I69828 Other speech and language deficits following other cerebrovascular disease: Secondary | ICD-10-CM | POA: Diagnosis not present

## 2019-01-07 DIAGNOSIS — J189 Pneumonia, unspecified organism: Secondary | ICD-10-CM | POA: Diagnosis not present

## 2019-01-08 DIAGNOSIS — I69828 Other speech and language deficits following other cerebrovascular disease: Secondary | ICD-10-CM | POA: Diagnosis not present

## 2019-01-08 DIAGNOSIS — R1311 Dysphagia, oral phase: Secondary | ICD-10-CM | POA: Diagnosis not present

## 2019-01-08 DIAGNOSIS — J189 Pneumonia, unspecified organism: Secondary | ICD-10-CM | POA: Diagnosis not present

## 2019-01-08 DIAGNOSIS — R627 Adult failure to thrive: Secondary | ICD-10-CM | POA: Diagnosis not present

## 2019-01-08 DIAGNOSIS — M6281 Muscle weakness (generalized): Secondary | ICD-10-CM | POA: Diagnosis not present

## 2019-01-08 DIAGNOSIS — I69891 Dysphagia following other cerebrovascular disease: Secondary | ICD-10-CM | POA: Diagnosis not present

## 2019-01-08 DIAGNOSIS — R488 Other symbolic dysfunctions: Secondary | ICD-10-CM | POA: Diagnosis not present

## 2019-01-09 DIAGNOSIS — J189 Pneumonia, unspecified organism: Secondary | ICD-10-CM | POA: Diagnosis not present

## 2019-01-09 DIAGNOSIS — R1311 Dysphagia, oral phase: Secondary | ICD-10-CM | POA: Diagnosis not present

## 2019-01-09 DIAGNOSIS — R627 Adult failure to thrive: Secondary | ICD-10-CM | POA: Diagnosis not present

## 2019-01-09 DIAGNOSIS — R488 Other symbolic dysfunctions: Secondary | ICD-10-CM | POA: Diagnosis not present

## 2019-01-09 DIAGNOSIS — I69891 Dysphagia following other cerebrovascular disease: Secondary | ICD-10-CM | POA: Diagnosis not present

## 2019-01-09 DIAGNOSIS — M6281 Muscle weakness (generalized): Secondary | ICD-10-CM | POA: Diagnosis not present

## 2019-01-09 DIAGNOSIS — I69828 Other speech and language deficits following other cerebrovascular disease: Secondary | ICD-10-CM | POA: Diagnosis not present

## 2019-01-17 ENCOUNTER — Encounter: Payer: Self-pay | Admitting: Adult Health

## 2019-01-17 ENCOUNTER — Non-Acute Institutional Stay (SKILLED_NURSING_FACILITY): Payer: Medicare Other | Admitting: Adult Health

## 2019-01-17 DIAGNOSIS — F0150 Vascular dementia without behavioral disturbance: Secondary | ICD-10-CM

## 2019-01-17 DIAGNOSIS — R627 Adult failure to thrive: Secondary | ICD-10-CM

## 2019-01-17 DIAGNOSIS — F482 Pseudobulbar affect: Secondary | ICD-10-CM | POA: Diagnosis not present

## 2019-01-17 DIAGNOSIS — R569 Unspecified convulsions: Secondary | ICD-10-CM

## 2019-01-17 DIAGNOSIS — I10 Essential (primary) hypertension: Secondary | ICD-10-CM | POA: Diagnosis not present

## 2019-01-17 DIAGNOSIS — E785 Hyperlipidemia, unspecified: Secondary | ICD-10-CM

## 2019-01-17 NOTE — Progress Notes (Signed)
Location:    [facility] Room Number: 203D Place of Service:  SNF (31)   CODE STATUS: full code   Allergies  Allergen Reactions  . Penicillins Other (See Comments)    Tolerated Ceftriaxone 04/2018 Other reaction(s): UNKNOWN REACTION      Chief Complaint  Patient presents with  . Medical Management of Chronic Issues    Essential hypertension; vascular dementia without behavioral disturbance; seizure. Weekly follow up for the first 30 days post hospitalization.     HPI:  She is a 78 year old long term resident of this facility being seen for the management of her chronic illnesses: hypertension; dementia; seizure. There are no reports of uncontrolled pain; no changes in appetite; no reports of seizure activity; no reports of agitation or fevers. She had been hospitalized for multifocal pneumonia. She is a long term care resident of this facility.    Past Medical History:  Diagnosis Date  . Alzheimer's disease (HCC)   . Chronic constipation   . Depressed   . Failure to thrive in adult   . HCAP (healthcare-associated pneumonia)   . Hyperlipidemia   . Hypernatremia   . Hypertension   . Hypokalemia   . Low back pain   . Migraines   . Osteoarthritis   . Osteoporosis   . PBA (pseudobulbar affect)   . Post-ictal state (HCC)   . Seizures (HCC)   . Urinary tract infection with hematuria   . Vitamin B12 deficiency     Past Surgical History:  Procedure Laterality Date  . ABDOMINAL HYSTERECTOMY    . CHOLECYSTECTOMY    . KNEE SURGERY     total    Social History   Socioeconomic History  . Marital status: Widowed    Spouse name: Not on file  . Number of children: Not on file  . Years of education: Not on file  . Highest education level: Not on file  Occupational History  . Not on file  Social Needs  . Financial resource strain: Not on file  . Food insecurity:    Worry: Not on file    Inability: Not on file  . Transportation  needs:    Medical: Not on file    Non-medical: Not on file  Tobacco Use  . Smoking status: Former Games developer  . Smokeless tobacco: Never Used  Substance and Sexual Activity  . Alcohol use: No  . Drug use: No  . Sexual activity: Not on file  Lifestyle  . Physical activity:    Days per week: Not on file    Minutes per session: Not on file  . Stress: Not on file  Relationships  . Social connections:    Talks on phone: Not on file    Gets together: Not on file    Attends religious service: Not on file    Active member of club or organization: Not on file    Attends meetings of clubs or organizations: Not on file    Relationship status: Not on file  . Intimate partner violence:    Fear of current or ex partner: Not on file    Emotionally abused: Not on file    Physically abused: Not on file    Forced sexual activity: Not on file  Other Topics Concern  . Not on file  Social History Narrative         Family History  Problem Relation Age of Onset  . Hypertension Mother   .  Peripheral vascular disease Mother        Bilateral leg amputations  . Alzheimer's disease Father   . Stroke Brother   . Heart attack Brother       VITAL SIGNS BP (!) 142/75   Pulse 73   Temp 98 F (36.7 C)   Resp 16   Ht 5\' 6"  (1.676 m)   Wt 155 lb (70.3 kg)   BMI 25.02 kg/m   Outpatient Encounter Medications as of 01/17/2019  Medication Sig  . acetaminophen (TYLENOL) 325 MG tablet Take 650 mg by mouth every 6 (six) hours as needed for mild pain.   Marland Kitchen amLODipine (NORVASC) 10 MG tablet Take 10 mg by mouth daily.  Marland Kitchen aspirin (ASPIRIN EC) 81 MG EC tablet Take 81 mg by mouth daily. Swallow whole.   . bisacodyl (DULCOLAX) 10 MG suppository Place 10 mg rectally daily as needed for moderate constipation (if not relieved by MOM).   . bisacodyl (FLEET) 10 MG/30ML ENEM Place 10 mg rectally daily as needed (if not relieved by Bisacodyl suppository).   . calcium carbonate (OS-CAL) 1250 (500 Ca) MG chewable  tablet Chew 1 tablet by mouth daily.  . Cholecalciferol (D3-1000) 1000 units tablet Take 1,000 Units by mouth daily.  Marland Kitchen Dextromethorphan-quiNIDine (NUEDEXTA) 20-10 MG CAPS Take 1 capsule by mouth daily.   . feeding supplement, ENSURE ENLIVE, (ENSURE ENLIVE) LIQD Take 237 mLs by mouth 4 (four) times daily.  . hydrALAZINE (APRESOLINE) 25 MG tablet Take 3 tablets (75 mg total) by mouth every 8 (eight) hours.  . levETIRAcetam (KEPPRA) 100 MG/ML solution Take 5 mLs (500 mg total) by mouth 2 (two) times daily.  . Magnesium Hydroxide (MILK OF MAGNESIA PO) Take 30 mLs by mouth daily as needed (if no BM in 3 days).   . memantine (NAMENDA XR) 28 MG CP24 24 hr capsule Take 28 mg by mouth at bedtime.   Marland Kitchen NUTRITIONAL SUPPLEMENTS PO Initiate 120 ml Medpass 3 times daily with meals related to weight loss  . Omega-3 Fatty Acids (FISH OIL PO) Take 5 mLs by mouth daily. 1,600 MG/5 ML LIQUID- take 5 ml po daily as supplement   . pravastatin (PRAVACHOL) 40 MG tablet Take 40 mg by mouth daily.  . rivastigmine (EXELON) 6 MG capsule Take 6 mg by mouth 2 (two) times daily.   . vitamin B-12 (CYANOCOBALAMIN) 1000 MCG tablet Take 1,000 mcg by mouth daily.   No facility-administered encounter medications on file as of 01/17/2019.      SIGNIFICANT DIAGNOSTIC EXAMS  LABS REVIEWED TODAY:   02-12-18: chol 152 ldl 99; trig 21; hdl 49 hgb a1c 4.2  07-23-18: tsh 1.25 12-28-18: wbc 8.4; hgb 11.5; hct 35.8; mcv 97.3; plt 102 glucose 92; bun 13; creat 0.55; k+ 3.5; na++ 147; ca 8.8  12-29-18: glucose 100; bun 6; creat 0.42; k+ 3.2 na++147; ca 9.4  Review of Systems  Unable to perform ROS: Dementia (unable to participate )    Physical Exam Constitutional:      General: She is not in acute distress.    Appearance: She is well-developed. She is not diaphoretic.  Neck:     Musculoskeletal: Neck supple.     Thyroid: No thyromegaly.  Cardiovascular:     Rate and Rhythm: Normal rate and regular rhythm.     Pulses: Normal  pulses.     Heart sounds: Normal heart sounds.  Pulmonary:     Effort: Pulmonary effort is normal. No respiratory distress.     Breath  sounds: Normal breath sounds.  Abdominal:     General: Bowel sounds are normal. There is no distension.     Palpations: Abdomen is soft.     Tenderness: There is no abdominal tenderness.  Musculoskeletal:     Right lower leg: No edema.     Left lower leg: No edema.     Comments: Is able to move all extremities   Lymphadenopathy:     Cervical: No cervical adenopathy.  Skin:    General: Skin is warm and dry.  Neurological:     Mental Status: She is alert. Mental status is at baseline.  Psychiatric:        Mood and Affect: Mood normal.       ASSESSMENT/ PLAN:  TODAY:   1. Essential hypertension: is stable b/p 142/75: will continue norvasc 10 mg daily apresoline 75 mg every 8 hours; asa 81 mg daily  2. Vascular dementia without behavioral disturbance: is without change: weight is 155 pounds; will continue namenda xr 28 mg daily and exelon 6 mg twice daily   3. Seizure: is stable no reports of seizure activity: will continue keppra 500 mg twice daily   4. PBA (pseudobulbar affect) is emotionally stable: will continue nuedexta 20/10 mg daily  5.  Hyperlipidemia; unspecified hyperlipidemia type: is stable LDL 99 will continue pravachol 40 mg daily and fish old 1600 mg daily   6. Failure to thrive in adult: is without change: weight is 155 pounds; will continue supplements as directed and will monitor         MD is aware of resident's narcotic use and is in agreement with current plan of care. We will attempt to wean resident as apropriate   Synthia Innocent NP [facility]  Contact [phone] Monday through Friday 8am- 5pm  After hours call [phone]

## 2019-02-11 DIAGNOSIS — I69891 Dysphagia following other cerebrovascular disease: Secondary | ICD-10-CM | POA: Diagnosis not present

## 2019-02-11 DIAGNOSIS — R1311 Dysphagia, oral phase: Secondary | ICD-10-CM | POA: Diagnosis not present

## 2019-02-12 DIAGNOSIS — R1311 Dysphagia, oral phase: Secondary | ICD-10-CM | POA: Diagnosis not present

## 2019-02-12 DIAGNOSIS — I69891 Dysphagia following other cerebrovascular disease: Secondary | ICD-10-CM | POA: Diagnosis not present

## 2019-02-13 ENCOUNTER — Non-Acute Institutional Stay (SKILLED_NURSING_FACILITY): Payer: Medicare Other | Admitting: Internal Medicine

## 2019-02-13 DIAGNOSIS — R404 Transient alteration of awareness: Secondary | ICD-10-CM | POA: Diagnosis not present

## 2019-02-14 ENCOUNTER — Encounter: Payer: Self-pay | Admitting: Internal Medicine

## 2019-02-14 ENCOUNTER — Non-Acute Institutional Stay (SKILLED_NURSING_FACILITY): Payer: Medicare Other | Admitting: Internal Medicine

## 2019-02-14 DIAGNOSIS — R319 Hematuria, unspecified: Secondary | ICD-10-CM | POA: Diagnosis not present

## 2019-02-14 DIAGNOSIS — N390 Urinary tract infection, site not specified: Secondary | ICD-10-CM | POA: Diagnosis not present

## 2019-02-14 DIAGNOSIS — Z79899 Other long term (current) drug therapy: Secondary | ICD-10-CM | POA: Diagnosis not present

## 2019-02-14 DIAGNOSIS — E870 Hyperosmolality and hypernatremia: Secondary | ICD-10-CM | POA: Diagnosis not present

## 2019-02-14 DIAGNOSIS — D649 Anemia, unspecified: Secondary | ICD-10-CM | POA: Diagnosis not present

## 2019-02-14 DIAGNOSIS — I10 Essential (primary) hypertension: Secondary | ICD-10-CM | POA: Diagnosis not present

## 2019-02-14 NOTE — Progress Notes (Signed)
: Provider:  Randon Goldsmith. Lyn Hollingshead, MD Location:  [facility] Room Number: [id number] Place of Service:  SNF ([phone])  PCP: [name], MD Patient Care Team: [name], MD as PCP - General (Internal Medicine)  Extended Emergency Contact Information Primary Emergency Contact: [name],[name] Address: [address]          [place], [place] [address] [place] Home Phone: [phone] Relation: Daughter Secondary Emergency Contact: [name] Mobile Phone: [phone] Relation: Daughter     Allergies: Penicillins  Chief Complaint  Patient presents with  . Acute Visit    HPI: Patient is 78 y.o. female who the director of nursing asked me to see.  Patient had an episode where she was little bit less responsive, but all of her vital signs were stable her blood sugar was normal her O2 sat was in the 90s.  She was trying to have a bowel movement, and when they laid her over on her back and raise the head of the bed to put her in a more comfortable position to evacuate patient improved.  When also patient she was moderately alert no acute distress.  No fever no nausea, vomiting diarrhea, stool that was coming out was soft.  Past Medical History:  Diagnosis Date  . Alzheimer's disease (HCC)   . Chronic constipation   . Depressed   . Failure to thrive in adult   . HCAP (healthcare-associated pneumonia)   . Hyperlipidemia   . Hypernatremia   . Hypertension   . Hypokalemia   . Low back pain   . Migraines   . Osteoarthritis   . Osteoporosis   . PBA (pseudobulbar affect)   . Post-ictal state (HCC)   . Seizures (HCC)   . Urinary tract infection with hematuria   . Vitamin B12 deficiency     Past Surgical History:  Procedure Laterality Date  . ABDOMINAL HYSTERECTOMY    . CHOLECYSTECTOMY    . KNEE SURGERY     total    Allergies as of 02/13/2019      Reactions   Penicillins Other (See Comments)   Tolerated Ceftriaxone 04/2018  Other reaction(s): UNKNOWN REACTION      Medication List       Accurate as of February 13, 2019 11:59 PM. Always use your most recent med list.        acetaminophen 325 MG tablet Commonly known as:  TYLENOL Take 650 mg by mouth every 6 (six) hours as needed for mild pain.   amLODipine 10 MG tablet Commonly known as:  NORVASC Take 10 mg by mouth daily.   aspirin EC 81 MG EC tablet Generic drug:  aspirin Take 81 mg by mouth daily. Swallow whole.   bisacodyl 10 MG suppository Commonly known as:  DULCOLAX Place 10 mg rectally daily as needed for moderate constipation (if not relieved by MOM).   bisacodyl 10 MG/30ML Enem Commonly known as:  FLEET Place 10 mg rectally daily as needed (if not relieved by Bisacodyl suppository).   calcium carbonate 1250 (500 Ca) MG chewable tablet Commonly known as:  OS-CAL Chew 1 tablet by mouth daily.   D3-1000 25 MCG (1000 UT) tablet Generic drug:  Cholecalciferol Take 1,000 Units by mouth daily.   FISH OIL PO Take 5 mLs by mouth daily. 1,600 MG/5 ML LIQUID- take 5 ml po daily as supplement   levETIRAcetam 100 MG/ML solution Commonly known as:  KEPPRA Take 5 mLs (500 mg  total) by mouth 2 (two) times daily.   MILK OF MAGNESIA PO Take 30 mLs by mouth daily as needed (if no BM in 3 days).   Namenda XR 28 MG Cp24 24 hr capsule Generic drug:  memantine Take 28 mg by mouth at bedtime.   Nuedexta 20-10 MG capsule Generic drug:  Dextromethorphan-quiNIDine Take 1 capsule by mouth daily.   NUTRITIONAL SUPPLEMENTS PO Initiate 120 ml Medpass 3 times daily with meals related to weight loss   feeding supplement (ENSURE ENLIVE) Liqd Take 237 mLs by mouth 4 (four) times daily.   pravastatin 40 MG tablet Commonly known as:  PRAVACHOL Take 40 mg by mouth daily.   rivastigmine 6 MG capsule Commonly known as:  EXELON Take 6 mg by mouth 2 (two) times daily.   vitamin B-12 1000 MCG tablet Commonly known as:  CYANOCOBALAMIN Take 1,000 mcg by  mouth daily.       No orders of the defined types were placed in this encounter.   Immunization History  Administered Date(s) Administered  . Influenza, High Dose Seasonal PF 08/01/2016  . Influenza-Unspecified 07/31/2017, 07/05/2018  . PPD Test 12/09/2017  . Pneumococcal Conjugate-13 08/01/2016  . Tdap 12/16/2014    Social History   Tobacco Use  . Smoking status: Former Games developer  . Smokeless tobacco: Never Used  Substance Use Topics  . Alcohol use: No    Family history is   Family History  Problem Relation Age of Onset  . Hypertension Mother   . Peripheral vascular disease Mother        Bilateral leg amputations  . Alzheimer's disease Father   . Stroke Brother   . Heart attack Brother       Review of Systems  DATA OBTAINED: from nurse GENERAL:  no fevers, fatigue, appetite changes SKIN: No itching, or rash EYES: No eye pain, redness, discharge EARS: No earache, tinnitus, change in hearing NOSE: No congestion, drainage or bleeding  MOUTH/THROAT: No mouth or tooth pain, No sore throat RESPIRATORY: No cough, wheezing, SOB CARDIAC: No chest pain, palpitations, lower extremity edema  GI: No abdominal pain, No N/V/D or constipation, No heartburn or reflux  GU: No dysuria, frequency or urgency, or incontinence  MUSCULOSKELETAL: No unrelieved bone/joint pain NEUROLOGIC: No headache, dizziness or focal weakness PSYCHIATRIC: No c/o anxiety or sadness   Vitals:   02/13/19 1531  BP: 140/79  Pulse: 78  Resp: 18  Temp: 98 F (36.7 C)  SpO2: 98%    SpO2 Readings from Last 1 Encounters:  02/13/19 98%   Body mass index is 24.79 kg/m.     Physical Exam  GENERAL APPEARANCE: Semi-alert alert,  No acute distress.  SKIN: No diaphoresis rash HEAD: Normocephalic, atraumatic  EYES: Conjunctiva/lids clear. Pupils round, reactive. EOMs intact.  EARS: External exam WNL, canals clear. Hearing grossly normal.  NOSE: No deformity or discharge.  MOUTH/THROAT: Lips w/o  lesions  RESPIRATORY: Breathing is even, unlabored. Lung sounds are clear   CARDIOVASCULAR: Heart RRR no murmurs, rubs or gallops. No peripheral edema.   GASTROINTESTINAL: Abdomen is soft, non-tender, not distended w/ normal bowel sounds. GENITOURINARY: Bladder non tender, not distended  MUSCULOSKELETAL: No abnormal joints or musculature NEUROLOGIC:  Cranial nerves 2-12 grossly intact. Moves all extremities  PSYCHIATRIC: Mood and affect flat, no behavioral issues  Patient Active Problem List   Diagnosis Date Noted  . Malnutrition of moderate degree 12/28/2018  . Palliative care by specialist   . Goals of care, counseling/discussion   . Emesis 12/27/2018  .  Enterococcus UTI 04/29/2018  . CAP (community acquired pneumonia) 04/25/2018  . Multifocal pneumonia 04/22/2018  . Post-ictal state (HCC) 02/18/2018  . Urinary tract infection with hematuria   . Seizure (HCC) 02/11/2018  . Chronic constipation 02/09/2018  . Dementia (HCC) 12/16/2017  . Failure to thrive in adult 12/16/2017  . Hypernatremia 12/16/2017  . Hypokalemia 12/16/2017  . Hypertension 12/16/2017  . 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 Hyperlipidemia 12/16/2017  . Osteoporosis 12/16/2017  . Vitamin B12 deficiency 12/16/2017  . Depression 12/16/2017      Labs reviewed: Basic Metabolic Panel:    Component Value Date/Time   NA 147 (H) 12/29/2018 0554   NA 146 07/27/2018   K 3.2 (L) 12/29/2018 0554   CL 111 12/29/2018 0554   CO2 27 12/29/2018 0554   GLUCOSE 100 (H) 12/29/2018 0554   BUN 6 (L) 12/29/2018 0554   BUN 11 07/27/2018   CREATININE 0.42 (L) 12/29/2018 0554   CALCIUM 9.4 12/29/2018 0554   PROT 6.9 12/26/2018 2332   ALBUMIN 3.5 12/26/2018 2332   AST 20 12/26/2018 2332   ALT 12 12/26/2018 2332   ALKPHOS 78 12/26/2018 2332   BILITOT 0.5 12/26/2018 2332   GFRNONAA >60 12/29/2018 0554   GFRAA >60 12/29/2018 0554    Recent Labs    12/26/18 2332 12/28/18 0549 12/29/18 0554  NA 146* 147* 147*   K 3.6 3.5 3.2*  CL 109 115* 111  CO2 27 27 27   GLUCOSE 122* 92 100*  BUN 22 13 6*  CREATININE 0.74 0.55 0.42*  CALCIUM 9.5 8.8* 9.4   Liver Function Tests: Recent Labs    04/22/18 2034 04/23/18 0822 12/26/18 2332  AST 20 23 20   ALT 11 10 12   ALKPHOS 77 73 78  BILITOT 0.9 1.2 0.5  PROT 6.7 5.8* 6.9  ALBUMIN 3.3* 2.9* 3.5   Recent Labs    04/22/18 2034 12/26/18 2332  LIPASE 38 36   No results for input(s): AMMONIA in the last 8760 hours. CBC: Recent Labs    04/22/18 2034  04/23/18 0822  07/22/18 12/26/18 2332 12/28/18 0549  WBC 7.1  --  7.9   < > 7.2 4.3 8.4  NEUTROABS 3.8  --   --   --  6  --   --   HGB 13.5   < > 13.1   < > 15.4 14.3 11.5*  HCT 42.4   < > 39.8   < > 46 45.5 35.8*  MCV 96.8  --  93.4  --   --  98.5 97.3  PLT 153  --  117*   < > 120* 133* 102*   < > = values in this interval not displayed.   Lipid No results for input(s): CHOL, HDL, LDLCALC, TRIG in the last 8760 hours.  Cardiac Enzymes: No results for input(s): CKTOTAL, CKMB, CKMBINDEX, TROPONINI in the last 8760 hours. BNP: No results for input(s): BNP in the last 8760 hours. No results found for: [facility] Lab Results  Component Value Date   HGBA1C 4.2 (L) 02/12/2018   Lab Results  Component Value Date   TSH 1.25 07/23/2018   No results found for: VITAMINB12 No results found for: FOLATE No results found for: IRON, TIBC, FERRITIN  Imaging and Procedures obtained prior to SNF admission: Ct Abdomen Pelvis W Contrast  Result Date: 12/27/2018 CLINICAL DATA:  Failure to thrive. Projectile vomiting after eating. EXAM: CT ABDOMEN AND PELVIS WITH CONTRAST TECHNIQUE: Multidetector CT imaging of the abdomen and pelvis was  performed using the standard protocol following bolus administration of intravenous contrast. CONTRAST:  OMNIPAQUE IOHEXOL 300 MG/ML  SOLN COMPARISON:  Report from 01/17/2010 CT FINDINGS: Lower chest: Normal heart size without pericardial effusion or thickening. Patchy  bibasilar airspace disease consistent with multilobar pneumonia. No effusion or pneumothorax. Hepatobiliary: Tiny subcentimeter scattered hypodensities are noted within the liver compatible with cysts, biliary hamartomas or small hemangiomas. Status post cholecystectomy. No choledocholithiasis. Reservoir effect with intrahepatic and extrahepatic ductal dilatation likely on the basis of prior cholecystectomy. Pancreas: No ductal dilatation, mass or inflammation. Spleen: Normal Adrenals/Urinary Tract: Thickening of the adrenal glands. Small cystic nodule of the right adrenal gland measuring 7 mm possibly a small adenoma but is too small to further characterize. Cortical scarring within both kidneys subcentimeter cyst associated with the lower pole the right kidney and interpolar left kidney. No nephrolithiasis nor obstructive uropathy. The urinary bladder is decompressed. Stomach/Bowel: The stomach is physiologically distended without mucosal thickening. Normal small bowel rotation without obstruction. Significant stool retention throughout the colon consistent constipation with probable fecal impaction in the rectum. Unremarkable appendix. Vascular/Lymphatic: Moderate aortoiliac atherosclerosis without aneurysm. No lymphadenopathy. Reproductive: Status post hysterectomy. No adnexal masses. Other: No free air free fluid.  Mild soft tissue anasarca. Musculoskeletal: Mild grade 1 anterolisthesis of L4 on L5 likely on the basis of degenerative disc disease and facet arthropathy. Vacuum disc phenomenon noted at L3-4 and L4-5. No acute fracture or aggressive osseous lesions. IMPRESSION: 1. Patchy multilobar airspace disease at the lung bases consistent multilobar pneumonia. Stigmata of CHF believed less likely given lack of significant pleural effusion or cardiomegaly. 2. Significant stool retention throughout the colon consistent constipation with probable fecal impaction. 3. Tiny too small to characterize cysts of the  liver and both kidneys. Electronically Signed   By: Tollie Eth M.D.   On: 12/27/2018 01:02     Not all labs, radiology exams or other studies done during hospitalization come through on my EPIC note; however they are reviewed by me.    Assessment and Plan  Decreased mental status-now improving as patient is defecating; patient has returned a large amount of soft stool; will order UA, patient has had absolutely no indications that she has any kind of upper respiratory infection.    [name], MD

## 2019-02-15 ENCOUNTER — Encounter: Payer: Self-pay | Admitting: Internal Medicine

## 2019-02-15 ENCOUNTER — Non-Acute Institutional Stay (SKILLED_NURSING_FACILITY): Payer: Medicare Other | Admitting: Internal Medicine

## 2019-02-15 DIAGNOSIS — R1311 Dysphagia, oral phase: Secondary | ICD-10-CM | POA: Diagnosis not present

## 2019-02-15 DIAGNOSIS — B952 Enterococcus as the cause of diseases classified elsewhere: Secondary | ICD-10-CM | POA: Diagnosis not present

## 2019-02-15 DIAGNOSIS — I69891 Dysphagia following other cerebrovascular disease: Secondary | ICD-10-CM | POA: Diagnosis not present

## 2019-02-15 DIAGNOSIS — N390 Urinary tract infection, site not specified: Secondary | ICD-10-CM | POA: Diagnosis not present

## 2019-02-15 DIAGNOSIS — M6281 Muscle weakness (generalized): Secondary | ICD-10-CM | POA: Diagnosis not present

## 2019-02-15 DIAGNOSIS — I69398 Other sequelae of cerebral infarction: Secondary | ICD-10-CM | POA: Diagnosis not present

## 2019-02-15 DIAGNOSIS — Z9181 History of falling: Secondary | ICD-10-CM | POA: Diagnosis not present

## 2019-02-15 DIAGNOSIS — I10 Essential (primary) hypertension: Secondary | ICD-10-CM | POA: Diagnosis not present

## 2019-02-15 DIAGNOSIS — E870 Hyperosmolality and hypernatremia: Secondary | ICD-10-CM

## 2019-02-15 DIAGNOSIS — D649 Anemia, unspecified: Secondary | ICD-10-CM | POA: Diagnosis not present

## 2019-02-15 NOTE — Progress Notes (Addendum)
Location:  [facility] Room Number: 203D Place of Service:  SNF [phone])  Randon Goldsmith. Lyn Hollingshead, MD  Patient Care Team: [name], MD as PCP - General (Internal Medicine)  Extended Emergency Contact Information Primary Emergency Contact: [name],[name] Address: [address]          [place], [place] [address] [place] Home Phone: [phone] Relation: Daughter Secondary Emergency Contact: [name] Mobile Phone: [phone] Relation: Daughter    Allergies: Penicillins  Chief Complaint  Patient presents with  . Acute Visit    HPI: Patient is 78 y.o. female who is being seen in follow-up to yesterday for hypernatremia.  Patient has been getting D5 half-normal saline.  And her labs today reveal a sodium of 144 which is excellent patient is reported to be more responsive today.  No fever chills nausea vomiting or diarrhea.  Past Medical History:  Diagnosis Date  . Alzheimer's disease (HCC)   . Chronic constipation   . Depressed   . Failure to thrive in adult   . HCAP (healthcare-associated pneumonia)   . Hyperlipidemia   . Hypernatremia   . Hypertension   . Hypokalemia   . Low back pain   . Migraines   . Osteoarthritis   . Osteoporosis   . PBA (pseudobulbar affect)   . Post-ictal state (HCC)   . Seizures (HCC)   . Urinary tract infection with hematuria   . Vitamin B12 deficiency     Past Surgical History:  Procedure Laterality Date  . ABDOMINAL HYSTERECTOMY    . CHOLECYSTECTOMY    . KNEE SURGERY     total    Allergies as of 02/15/2019      Reactions   Penicillins Other (See Comments)   Tolerated Ceftriaxone 04/2018 Other reaction(s): UNKNOWN REACTION      Medication List       Accurate as of Feb 15, 2019 11:59 PM. Always use your most recent med list.        acetaminophen 325 MG tablet Commonly known as:  TYLENOL Take 650 mg by mouth every 6 (six) hours as needed for mild pain.   amLODipine 10  MG tablet Commonly known as:  NORVASC Take 10 mg by mouth daily.   aspirin EC 81 MG EC tablet Generic drug:  aspirin Take 81 mg by mouth daily. Swallow whole.   bisacodyl 10 MG suppository Commonly known as:  DULCOLAX Place 10 mg rectally daily as needed for moderate constipation (if not relieved by MOM).   bisacodyl 10 MG/30ML Enem Commonly known as:  FLEET Place 10 mg rectally daily as needed (if not relieved by Bisacodyl suppository).   calcium carbonate 1250 (500 Ca) MG chewable tablet Commonly known as:  OS-CAL Chew 1 tablet by mouth daily.   cloNIDine 0.2 mg/24hr patch Commonly known as:  CATAPRES - Dosed in mg/24 hr Place 0.2 mg onto the skin once a week.   D3-1000 25 MCG (1000 UT) tablet Generic drug:  Cholecalciferol Take 1,000 Units by mouth daily.   FISH OIL PO Take 5 mLs by mouth daily. 1,600 MG/5 ML LIQUID- take 5 ml po daily as supplement   levETIRAcetam 100 MG/ML solution Commonly known as:  KEPPRA Take 5 mLs (500 mg total) by mouth 2 (two) times daily.   MILK OF MAGNESIA PO Take 30 mLs by mouth daily as needed (if no BM in 3 days).   Namenda XR 28 MG Cp24 24 hr capsule Generic  drug:  memantine Take 28 mg by mouth at bedtime.   Nuedexta 20-10 MG capsule Generic drug:  Dextromethorphan-quiNIDine Take 1 capsule by mouth daily.   NUTRITIONAL SUPPLEMENTS PO Initiate 120 ml Medpass 3 times daily with meals related to weight loss   feeding supplement (ENSURE ENLIVE) Liqd Take 237 mLs by mouth 4 (four) times daily.   pravastatin 40 MG tablet Commonly known as:  PRAVACHOL Take 40 mg by mouth daily.   rivastigmine 6 MG capsule Commonly known as:  EXELON Take 6 mg by mouth 2 (two) times daily.   vitamin B-12 1000 MCG tablet Commonly known as:  CYANOCOBALAMIN Take 1,000 mcg by mouth daily.       No orders of the defined types were placed in this encounter.   Immunization History  Administered Date(s) Administered  . Influenza, High Dose  Seasonal PF 08/01/2016  . Influenza-Unspecified 07/31/2017, 07/05/2018  . PPD Test 12/09/2017  . Pneumococcal Conjugate-13 08/01/2016  . Tdap 12/16/2014    Social History   Tobacco Use  . Smoking status: Former Games developer  . Smokeless tobacco: Never Used  Substance Use Topics  . Alcohol use: No    Review of Systems unable to obtain secondary to patient's responsive level; nursing- as per history of present illness patient is more responsive today     Vitals:   02/15/19 1114  BP: 131/72  Pulse: 76  Resp: 19   Body mass index is 24.79 kg/m. Physical Exam  GENERAL APPEARANCE: Semi-alert,  No acute distress response to voice SKIN: No diaphoresis rash HEENT: Unremarkable RESPIRATORY: Breathing is even, unlabored. Lung sounds are clear   CARDIOVASCULAR: Heart RRR no murmurs, rubs or gallops. No peripheral edema  GASTROINTESTINAL: Abdomen is soft, non-tender, not distended w/ normal bowel sounds.  GENITOURINARY: Bladder non tender, not distended  MUSCULOSKELETAL: No abnormal joints or musculature NEUROLOGIC: Cranial nerves 2-12 grossly intact. Moves all extremities PSYCHIATRIC: Flat affect, no behavioral issues  Patient Active Problem List   Diagnosis Date Noted  . Malnutrition of moderate degree 12/28/2018  . Palliative care by specialist   . Goals of care, counseling/discussion   . Emesis 12/27/2018  . Enterococcus UTI 04/29/2018  . CAP (community acquired pneumonia) 04/25/2018  . Multifocal pneumonia 04/22/2018  . Post-ictal state (HCC) 02/18/2018  . Urinary tract infection with hematuria   . Seizure (HCC) 02/11/2018  . Chronic constipation 02/09/2018  . Dementia (HCC) 12/16/2017  . Failure to thrive in adult 12/16/2017  . Hypernatremia 12/16/2017  . Hypokalemia 12/16/2017  . Hypertension 12/16/2017  . 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 Hyperlipidemia 12/16/2017  . Osteoporosis 12/16/2017  . Vitamin B12 deficiency 12/16/2017  . Depression 12/16/2017     CMP     Component Value Date/Time   NA 147 (H) 12/29/2018 0554   NA 146 07/27/2018   K 3.2 (L) 12/29/2018 0554   CL 111 12/29/2018 0554   CO2 27 12/29/2018 0554   GLUCOSE 100 (H) 12/29/2018 0554   BUN 6 (L) 12/29/2018 0554   BUN 11 07/27/2018   CREATININE 0.42 (L) 12/29/2018 0554   CALCIUM 9.4 12/29/2018 0554   PROT 6.9 12/26/2018 2332   ALBUMIN 3.5 12/26/2018 2332   AST 20 12/26/2018 2332   ALT 12 12/26/2018 2332   ALKPHOS 78 12/26/2018 2332   BILITOT 0.5 12/26/2018 2332   GFRNONAA >60 12/29/2018 0554   GFRAA >60 12/29/2018 0554   Recent Labs    12/26/18 2332 12/28/18 0549 12/29/18 0554  NA 146* 147* 147*  K  3.6 3.5 3.2*  CL 109 115* 111  CO2 27 27 27   GLUCOSE 122* 92 100*  BUN 22 13 6*  CREATININE 0.74 0.55 0.42*  CALCIUM 9.5 8.8* 9.4   Recent Labs    04/22/18 2034 04/23/18 0822 12/26/18 2332  AST 20 23 20   ALT 11 10 12   ALKPHOS 77 73 78  BILITOT 0.9 1.2 0.5  PROT 6.7 5.8* 6.9  ALBUMIN 3.3* 2.9* 3.5   Recent Labs    04/22/18 2034  04/23/18 0822  07/22/18 12/26/18 2332 12/28/18 0549  WBC 7.1  --  7.9   < > 7.2 4.3 8.4  NEUTROABS 3.8  --   --   --  6  --   --   HGB 13.5   < > 13.1   < > 15.4 14.3 11.5*  HCT 42.4   < > 39.8   < > 46 45.5 35.8*  MCV 96.8  --  93.4  --   --  98.5 97.3  PLT 153  --  117*   < > 120* 133* 102*   < > = values in this interval not displayed.   No results for input(s): CHOL, LDLCALC, TRIG in the last 8760 hours.  Invalid input(s): HCL No results found for: [facility] Lab Results  Component Value Date   TSH 1.25 07/23/2018   Lab Results  Component Value Date   HGBA1C 4.2 (L) 02/12/2018   Lab Results  Component Value Date   CHOL 152 02/12/2018   HDL 49 02/12/2018   LDLCALC 99 02/12/2018   TRIG 21 02/12/2018   CHOLHDL 3.1 02/12/2018    Significant Diagnostic Results in last 30 days:  No results found.  Assessment and Plan   UTI/hypernatremia- patient sodium has improved, but will continue half-normal saline  because she still needs a total of 1-1/2 L of free water; it appears the IV linezolid is improving patient situation as well; continue to monitor progress    Thurston Holenne D. Lyn HollingsheadAlexander, MD

## 2019-02-16 ENCOUNTER — Encounter: Payer: Self-pay | Admitting: Internal Medicine

## 2019-02-16 NOTE — Progress Notes (Signed)
[facility]   [facility] of Service:   Skilled [facility] facility  [name], Kendre Sires D, MD  Patient Care Team: [name], Kahne Helfand D, MD as PCP - General (Internal Medicine)  Extended Emergency Contact Information Primary Emergency Contact: [name],[name] Address: [address]          [place], [place] [address] [place] Home Phone: [phone] Relation: Daughter Secondary Emergency Contact: [name], Tracy Mobile Phone: [phone] Relation: Daughter    Allergies: Penicillins  Chief Complaint  Patient presents with  . Acute Visit    HPI: Patient is 78 y.o. female who is being seen for a sodium of 147 and poor p.o. intake.  Patient's BUN is 13.6 and creatinine 0.37 per nursing patient is not quite as sponsor today she was yesterday.  No fever chills nausea vomiting or diarrhea, no coughs or colds.  Past Medical History:  Diagnosis Date  . Alzheimer's disease (HCC)   . Chronic constipation   . Depressed   . Failure to thrive in adult   . HCAP (healthcare-associated pneumonia)   . Hyperlipidemia   . Hypernatremia   . Hypertension   . Hypokalemia   . Low back pain   . Migraines   . Osteoarthritis   . Osteoporosis   . PBA (pseudobulbar affect)   . Post-ictal state (HCC)   . Seizures (HCC)   . Urinary tract infection with hematuria   . Vitamin B12 deficiency     Past Surgical History:  Procedure Laterality Date  . ABDOMINAL HYSTERECTOMY    . CHOLECYSTECTOMY    . KNEE SURGERY     total    Allergies as of 02/14/2019      Reactions   Penicillins Other (See Comments)   Tolerated Ceftriaxone 04/2018 Other reaction(s): UNKNOWN REACTION      Medication List       Accurate as of February 14, 2019 11:59 PM. Always use your most recent med list.        acetaminophen 325 MG tablet Commonly known as:  TYLENOL Take 650 mg by mouth every 6 (six) hours as needed for mild pain.   amLODipine 10 MG tablet Commonly known as:  NORVASC Take 10 mg by  mouth daily.   aspirin EC 81 MG EC tablet Generic drug:  aspirin Take 81 mg by mouth daily. Swallow whole.   bisacodyl 10 MG suppository Commonly known as:  DULCOLAX Place 10 mg rectally daily as needed for moderate constipation (if not relieved by MOM).   bisacodyl 10 MG/30ML Enem Commonly known as:  FLEET Place 10 mg rectally daily as needed (if not relieved by Bisacodyl suppository).   calcium carbonate 1250 (500 Ca) MG chewable tablet Commonly known as:  OS-CAL Chew 1 tablet by mouth daily.   D3-1000 25 MCG (1000 UT) tablet Generic drug:  Cholecalciferol Take 1,000 Units by mouth daily.   FISH OIL PO Take 5 mLs by mouth daily. 1,600 MG/5 ML LIQUID- take 5 ml po daily as supplement   levETIRAcetam 100 MG/ML solution Commonly known as:  KEPPRA Take 5 mLs (500 mg total) by mouth 2 (two) times daily.   MILK OF MAGNESIA PO Take 30 mLs by mouth daily as needed (if no BM in 3 days).   Namenda XR 28 MG Cp24 24 hr capsule Generic drug:  memantine Take 28 mg by mouth at bedtime.   Nuedexta 20-10 MG capsule Generic drug:  Dextromethorphan-quiNIDine Take 1 capsule by mouth daily.   NUTRITIONAL SUPPLEMENTS PO  Initiate 120 ml Medpass 3 times daily with meals related to weight loss   feeding supplement (ENSURE ENLIVE) Liqd Take 237 mLs by mouth 4 (four) times daily.   pravastatin 40 MG tablet Commonly known as:  PRAVACHOL Take 40 mg by mouth daily.   rivastigmine 6 MG capsule Commonly known as:  EXELON Take 6 mg by mouth 2 (two) times daily.   vitamin B-12 1000 MCG tablet Commonly known as:  CYANOCOBALAMIN Take 1,000 mcg by mouth daily.       No orders of the defined types were placed in this encounter.   Immunization History  Administered Date(s) Administered  . Influenza, High Dose Seasonal PF 08/01/2016  . Influenza-Unspecified 07/31/2017, 07/05/2018  . PPD Test 12/09/2017  . Pneumococcal Conjugate-13 08/01/2016  . Tdap 12/16/2014    Social History    Tobacco Use  . Smoking status: Former Games developer  . Smokeless tobacco: Never Used  Substance Use Topics  . Alcohol use: No    Review of Systems   unable to obtain secondary to condition; nursing- a little less responsive today than yesterday, and poor p.o. intake    Vitals:   02/16/19 1254  BP: 131/72  Pulse: 76  Resp: 19  Temp: 98 F (36.7 C)   Body mass index is 24.79 kg/m. Physical Exam  GENERAL APPEARANCE: Mildly somnolent no acute distress  SKIN: No diaphoresis rash HEENT: Unremarkable RESPIRATORY: Breathing is even, unlabored. Lung sounds are clear   CARDIOVASCULAR: Heart RRR no murmurs, rubs or gallops. No peripheral edema  GASTROINTESTINAL: Abdomen is soft, non-tender, not distended w/ normal bowel sounds.  GENITOURINARY: Bladder non tender, not distended  MUSCULOSKELETAL: No abnormal joints or musculature NEUROLOGIC: Cranial nerves 2-12 grossly intact. Moves all extremities PSYCHIATRIC: Patient was somnolent, no behavioral issues  Patient Active Problem List   Diagnosis Date Noted  . Malnutrition of moderate degree 12/28/2018  . Palliative care by specialist   . Goals of care, counseling/discussion   . Emesis 12/27/2018  . Enterococcus UTI 04/29/2018  . CAP (community acquired pneumonia) 04/25/2018  . Multifocal pneumonia 04/22/2018  . Post-ictal state (HCC) 02/18/2018  . Urinary tract infection with hematuria   . Seizure (HCC) 02/11/2018  . Chronic constipation 02/09/2018  . Dementia (HCC) 12/16/2017  . Failure to thrive in adult 12/16/2017  . Hypernatremia 12/16/2017  . Hypokalemia 12/16/2017  . Hypertension 12/16/2017  . 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 Hyperlipidemia 12/16/2017  . Osteoporosis 12/16/2017  . Vitamin B12 deficiency 12/16/2017  . Depression 12/16/2017    CMP     Component Value Date/Time   NA 147 (H) 12/29/2018 0554   NA 146 07/27/2018   K 3.2 (L) 12/29/2018 0554   CL 111 12/29/2018 0554   CO2 27 12/29/2018 0554    GLUCOSE 100 (H) 12/29/2018 0554   BUN 6 (L) 12/29/2018 0554   BUN 11 07/27/2018   CREATININE 0.42 (L) 12/29/2018 0554   CALCIUM 9.4 12/29/2018 0554   PROT 6.9 12/26/2018 2332   ALBUMIN 3.5 12/26/2018 2332   AST 20 12/26/2018 2332   ALT 12 12/26/2018 2332   ALKPHOS 78 12/26/2018 2332   BILITOT 0.5 12/26/2018 2332   GFRNONAA >60 12/29/2018 0554   GFRAA >60 12/29/2018 0554   Recent Labs    12/26/18 2332 12/28/18 0549 12/29/18 0554  NA 146* 147* 147*  K 3.6 3.5 3.2*  CL 109 115* 111  CO2 GLUCOSE 122* 92 100*  BUN 22 13 6*  CREATININE 0.74 0.55  0.42*  CALCIUM 9.5 8.8* 9.4   Recent Labs    04/22/18 2034 04/23/18 0822 12/26/18 2332  AST 20 23 20   ALT 11 10 12   ALKPHOS 77 73 78  BILITOT 0.9 1.2 0.5  PROT 6.7 5.8* 6.9  ALBUMIN 3.3* 2.9* 3.5   Recent Labs    04/22/18 2034  04/23/18 0822  07/22/18 12/26/18 2332 12/28/18 0549  WBC 7.1  --  7.9   < > 7.2 4.3 8.4  NEUTROABS 3.8  --   --   --  6  --   --   HGB 13.5   < > 13.1   < > 15.4 14.3 11.5*  HCT 42.4   < > 39.8   < > 46 45.5 35.8*  MCV 96.8  --  93.4  --   --  98.5 97.3  PLT 153  --  117*   < > 120* 133* 102*   < > = values in this interval not displayed.   No results for input(s): CHOL, LDLCALC, TRIG in the last 8760 hours.  Invalid input(s): HCL No results found for: [facility] Lab Results  Component Value Date   TSH 1.25 07/23/2018   Lab Results  Component Value Date   HGBA1C 4.2 (L) 02/12/2018   Lab Results  Component Value Date   CHOL 152 02/12/2018   HDL 49 02/12/2018   LDLCALC 99 02/12/2018   TRIG 21 02/12/2018   CHOLHDL 3.1 02/12/2018    Significant Diagnostic Results in last 30 days:  No results found.  Assessment and Plan  Hypernatremia- start IV fluids with one half normal saline at 75 cc an hour; BMP in a.m.; UA looks like possible UTI with positive nitrite, 5-10 WBCs and 4+ bacteria-we will wait for MIC    Merrilee Seashore, MD

## 2019-02-18 ENCOUNTER — Non-Acute Institutional Stay (SKILLED_NURSING_FACILITY): Payer: Medicare Other | Admitting: Internal Medicine

## 2019-02-18 DIAGNOSIS — Z1612 Extended spectrum beta lactamase (ESBL) resistance: Secondary | ICD-10-CM

## 2019-02-18 DIAGNOSIS — N3090 Cystitis, unspecified without hematuria: Secondary | ICD-10-CM

## 2019-02-18 DIAGNOSIS — A498 Other bacterial infections of unspecified site: Secondary | ICD-10-CM

## 2019-02-19 ENCOUNTER — Encounter: Payer: Self-pay | Admitting: Internal Medicine

## 2019-02-19 NOTE — Progress Notes (Signed)
Location:   [facility]  Lyn Hollingshead, Randon Goldsmith, MD  Patient Care Team: [name], MD as PCP - General (Internal Medicine)  Extended Emergency Contact Information Primary Emergency Contact: [name],[name] Address: [address]          [place], [place] [address] [place] Home Phone: [phone] Relation: Daughter Secondary Emergency Contact: [name] Mobile Phone: [phone] Relation: Daughter    Allergies: Penicillins  Chief Complaint  Patient presents with  . Acute Visit    HPI: Patient is 78 y.o. female who is being seen because her urine returned with antibiotic sensitivities today.  Patient has been seen several days ago with a fever, decreased mental status and obvious illness.  Patient could not speak with [place] and she had no other symptoms so so source was sought.  Today patient returned with ESBL E. coli in her urine.  Patient is more responsive today as she has been getting IV fluids, ate breakfast although not much and drink her TV and a few sips of water.  Past Medical History:  Diagnosis Date  . Alzheimer's disease (HCC)   . Chronic constipation   . Depressed   . Failure to thrive in adult   . HCAP (healthcare-associated pneumonia)   . Hyperlipidemia   . Hypernatremia   . Hypertension   . Hypokalemia   . Low back pain   . Migraines   . Osteoarthritis   . Osteoporosis   . PBA (pseudobulbar affect)   . Post-ictal state (HCC)   . Seizures (HCC)   . Urinary tract infection with hematuria   . Vitamin B12 deficiency     Past Surgical History:  Procedure Laterality Date  . ABDOMINAL HYSTERECTOMY    . CHOLECYSTECTOMY    . KNEE SURGERY     total    Allergies as of 02/18/2019      Reactions   Penicillins Other (See Comments)   Tolerated Ceftriaxone 04/2018 Other reaction(s): UNKNOWN REACTION      Medication List       Accurate as of Feb 18, 2019 11:59 PM. Always use your most recent med list.         acetaminophen 325 MG tablet Commonly known as:  TYLENOL Take 650 mg by mouth every 6 (six) hours as needed for mild pain.   amLODipine 10 MG tablet Commonly known as:  NORVASC Take 10 mg by mouth daily.   aspirin EC 81 MG EC tablet Generic drug:  aspirin Take 81 mg by mouth daily. Swallow whole.   bisacodyl 10 MG suppository Commonly known as:  DULCOLAX Place 10 mg rectally daily as needed for moderate constipation (if not relieved by MOM).   bisacodyl 10 MG/30ML Enem Commonly known as:  FLEET Place 10 mg rectally daily as needed (if not relieved by Bisacodyl suppository).   calcium carbonate 1250 (500 Ca) MG chewable tablet Commonly known as:  OS-CAL Chew 1 tablet by mouth daily.   cloNIDine 0.2 mg/24hr patch Commonly known as:  CATAPRES - Dosed in mg/24 hr Place 0.2 mg onto the skin once a week.   D3-1000 25 MCG (1000 UT) tablet Generic drug:  Cholecalciferol Take 1,000 Units by mouth daily.   FISH OIL PO Take 5 mLs by mouth daily. 1,600 MG/5 ML LIQUID- take 5 ml po daily as supplement   levETIRAcetam 100 MG/ML solution Commonly known as:  KEPPRA Take 5 mLs (500 mg total) by mouth 2 (two)  times daily.   MILK OF MAGNESIA PO Take 30 mLs by mouth daily as needed (if no BM in 3 days).   Namenda XR 28 MG Cp24 24 hr capsule Generic drug:  memantine Take 28 mg by mouth at bedtime.   Nuedexta 20-10 MG capsule Generic drug:  Dextromethorphan-quiNIDine Take 1 capsule by mouth daily.   NUTRITIONAL SUPPLEMENTS PO Initiate 120 ml Medpass 3 times daily with meals related to weight loss   feeding supplement (ENSURE ENLIVE) Liqd Take 237 mLs by mouth 4 (four) times daily.   pravastatin 40 MG tablet Commonly known as:  PRAVACHOL Take 40 mg by mouth daily.   rivastigmine 6 MG capsule Commonly known as:  EXELON Take 6 mg by mouth 2 (two) times daily.   vitamin B-12 1000 MCG tablet Commonly known as:  CYANOCOBALAMIN Take 1,000 mcg by mouth daily.       No  orders of the defined types were placed in this encounter.   Immunization History  Administered Date(s) Administered  . Influenza, High Dose Seasonal PF 08/01/2016  . Influenza-Unspecified 07/31/2017, 07/05/2018  . PPD Test 12/09/2017  . Pneumococcal Conjugate-13 08/01/2016  . Tdap 12/16/2014    Social History   Tobacco Use  . Smoking status: Former Games developer  . Smokeless tobacco: Never Used  Substance Use Topics  . Alcohol use: No    Review of Systems unable to obtain secondary to patient condition; nursing-as per history of present illness     Vitals:   02/19/19 1115  BP: (!) 150/73  Pulse: (!) 59  Resp: 18  Temp: 98 F (36.7 C)   Body mass index is 24.79 kg/m. Physical Exam  GENERAL APPEARANCE: Alert, non-conversant, No acute distress  SKIN: No diaphoresis rash HEENT: Unremarkable RESPIRATORY: Breathing is even, unlabored. Lung sounds are clear   CARDIOVASCULAR: Heart RRR no murmurs, rubs or gallops. No peripheral edema  GASTROINTESTINAL: Abdomen is soft, non-tender, not distended w/ normal bowel sounds.  GENITOURINARY: Bladder +tender, not distended  MUSCULOSKELETAL: No abnormal joints or musculature NEUROLOGIC: Cranial nerves 2-12 grossly intact. Moves all extremities PSYCHIATRIC: Back, dementia, no behavioral issues  Patient Active Problem List   Diagnosis Date Noted  . Malnutrition of moderate degree 12/28/2018  . Palliative care by specialist   . Goals of care, counseling/discussion   . Emesis 12/27/2018  . Enterococcus UTI 04/29/2018  . CAP (community acquired pneumonia) 04/25/2018  . Multifocal pneumonia 04/22/2018  . Post-ictal state (HCC) 02/18/2018  . Urinary tract infection with hematuria   . Seizure (HCC) 02/11/2018  . Chronic constipation 02/09/2018  . Dementia (HCC) 12/16/2017  . Failure to thrive in adult 12/16/2017  . Hypernatremia 12/16/2017  . Hypokalemia 12/16/2017  . Hypertension 12/16/2017  . PBA (pseudobulbar affect) 12/16/2017   . Hyperlipidemia 12/16/2017  . Osteoporosis 12/16/2017  . Vitamin B12 deficiency 12/16/2017  . Depression 12/16/2017    CMP     Component Value Date/Time   NA 147 (H) 12/29/2018 0554   NA 146 07/27/2018   K 3.2 (L) 12/29/2018 0554   CL 111 12/29/2018 0554   CO2 27 12/29/2018 0554   GLUCOSE 100 (H) 12/29/2018 0554   BUN 6 (L) 12/29/2018 0554   BUN 11 07/27/2018   CREATININE 0.42 (L) 12/29/2018 0554   CALCIUM 9.4 12/29/2018 0554   PROT 6.9 12/26/2018 2332   ALBUMIN 3.5 12/26/2018 2332   AST 20 12/26/2018 2332   ALT 12 12/26/2018 2332   ALKPHOS 78 12/26/2018 2332   BILITOT 0.5 12/26/2018 2332  GFRNONAA >60 12/29/2018 0554   GFRAA >60 12/29/2018 0554   Recent Labs    12/26/18 2332 12/28/18 0549 12/29/18 0554  NA 146* 147* 147*  K 3.6 3.5 3.2*  CL 109 115* 111  CO2 27 27 27   GLUCOSE 122* 92 100*  BUN 22 13 6*  CREATININE 0.74 0.55 0.42*  CALCIUM 9.5 8.8* 9.4   Recent Labs    04/22/18 2034 04/23/18 0822 12/26/18 2332  AST 20 23 20   ALT 11 10 12   ALKPHOS 77 73 78  BILITOT 0.9 1.2 0.5  PROT 6.7 5.8* 6.9  ALBUMIN 3.3* 2.9* 3.5   Recent Labs    04/22/18 2034  04/23/18 0822  07/22/18 12/26/18 2332 12/28/18 0549  WBC 7.1  --  7.9   < > 7.2 4.3 8.4  NEUTROABS 3.8  --   --   --  6  --   --   HGB 13.5   < > 13.1   < > 15.4 14.3 11.5*  HCT 42.4   < > 39.8   < > 46 45.5 35.8*  MCV 96.8  --  93.4  --   --  98.5 97.3  PLT 153  --  117*   < > 120* 133* 102*   < > = values in this interval not displayed.   No results for input(s): CHOL, LDLCALC, TRIG in the last 8760 hours.  Invalid input(s): HCL No results found for: [facility] Lab Results  Component Value Date   TSH 1.25 07/23/2018   Lab Results  Component Value Date   HGBA1C 4.2 (L) 02/12/2018   Lab Results  Component Value Date   CHOL 152 02/12/2018   HDL 49 02/12/2018   LDLCALC 99 02/12/2018   TRIG 21 02/12/2018   CHOLHDL 3.1 02/12/2018    Significant Diagnostic Results in last 30 days:  No  results found.  Assessment and Plan  ESBL E. coli greater than 100,000- this returned yesterday on Sunday and the on-call physician put the patient on Septra DS twice daily for 7 days.  I do not think this is appropriate for 2 reasons 1 the MIC is sensitivity of less than 20 and 2 patient has not reliably taken pills.  I am changing her to ertapenem 1 g IV daily for 10 days.  We will have to obtain a midline for this.     Merrilee SeashoreAnne Alexander, MD

## 2019-02-21 ENCOUNTER — Encounter: Payer: Self-pay | Admitting: Internal Medicine

## 2019-02-21 ENCOUNTER — Non-Acute Institutional Stay (SKILLED_NURSING_FACILITY): Payer: Medicare Other | Admitting: Internal Medicine

## 2019-02-21 DIAGNOSIS — Z1612 Extended spectrum beta lactamase (ESBL) resistance: Secondary | ICD-10-CM

## 2019-02-21 DIAGNOSIS — B952 Enterococcus as the cause of diseases classified elsewhere: Secondary | ICD-10-CM | POA: Diagnosis not present

## 2019-02-21 DIAGNOSIS — B9629 Other Escherichia coli [E. coli] as the cause of diseases classified elsewhere: Secondary | ICD-10-CM

## 2019-02-21 DIAGNOSIS — N390 Urinary tract infection, site not specified: Secondary | ICD-10-CM | POA: Diagnosis not present

## 2019-02-21 DIAGNOSIS — R627 Adult failure to thrive: Secondary | ICD-10-CM

## 2019-02-21 LAB — LIPID PANEL
Cholesterol: 130 (ref 0–200)
HDL: 45 (ref 35–70)
LDL Cholesterol: 68
LDl/HDL Ratio: 2.9
Triglycerides: 82 (ref 40–160)

## 2019-02-21 LAB — BASIC METABOLIC PANEL
BUN: 8 (ref 4–21)
Creatinine: 0.4 — AB (ref <=1.1)
Glucose: 93
Potassium: 4.1 (ref 3.4–5.3)
Sodium: 145 (ref 137–147)

## 2019-02-21 LAB — HEMOGLOBIN A1C: Hemoglobin A1C: 5.5

## 2019-02-21 LAB — CBC AND DIFFERENTIAL
HCT: 40 (ref 36–46)
Hemoglobin: 13.8 (ref 12.0–16.0)
Platelets: 143 — AB (ref 150–399)
WBC: 6.5

## 2019-02-21 LAB — HEPATIC FUNCTION PANEL
ALT: 30 (ref 7–35)
AST: 23 (ref 13–35)
Alkaline Phosphatase: 101 (ref 25–125)
Bilirubin, Total: 0.9

## 2019-02-21 LAB — VITAMIN D 25 HYDROXY (VIT D DEFICIENCY, FRACTURES): Vit D, 25-Hydroxy: 22.9

## 2019-02-21 LAB — TSH: TSH: 1.3 (ref 0.41–5.90)

## 2019-02-21 NOTE — Progress Notes (Signed)
:  Location:  [facility] Room Number: 203D Place of Service:  SNF (31)  Melissa Jarvis. Lyn Hollingshead, MD  Patient Care Team: [name], MD as PCP - General (Internal Medicine)  Extended Emergency Contact Information Primary Emergency Contact: [name],[name] Address: [address]          [place], [place] [address] [place] Home Phone: [phone] Relation: Daughter Secondary Emergency Contact: [name] Mobile Phone: [phone] Relation: Daughter     Allergies: Penicillins  Chief Complaint  Patient presents with  . Acute Visit    HPI: Patient is 78 y.o. female who is being seen for poor p.o. intake and some lethargy secondary to.  Patient has been training for a UTI currently and was on IV fluids several days ago and seem to be doing better than she is now.  Per the age she did eat better today than she had before.  Patient's had no fever chills nausea vomiting.  Past Medical History:  Diagnosis Date  . Alzheimer's disease (HCC)   . Chronic constipation   . Depressed   . Failure to thrive in adult   . HCAP (healthcare-associated pneumonia)   . Hyperlipidemia   . Hypernatremia   . Hypertension   . Hypokalemia   . Low back pain   . Migraines   . Osteoarthritis   . Osteoporosis   . PBA (pseudobulbar affect)   . Post-ictal state (HCC)   . Seizures (HCC)   . Urinary tract infection with hematuria   . Vitamin B12 deficiency     Past Surgical History:  Procedure Laterality Date  . ABDOMINAL HYSTERECTOMY    . CHOLECYSTECTOMY    . KNEE SURGERY     total    Allergies as of 02/21/2019      Reactions   Penicillins Other (See Comments)   Tolerated Ceftriaxone 04/2018 Other reaction(s): UNKNOWN REACTION      Medication List       Accurate as of Feb 21, 2019  3:04 PM. If you have any questions, ask your nurse or doctor.        acetaminophen 325 MG tablet Commonly known as:  TYLENOL Take 650 mg by mouth every  6 (six) hours as needed for mild pain.   amLODipine 10 MG tablet Commonly known as:  NORVASC Take 10 mg by mouth daily.   aspirin EC 81 MG EC tablet Generic drug:  aspirin Take 81 mg by mouth daily. Swallow whole.   bisacodyl 10 MG suppository Commonly known as:  DULCOLAX Place 10 mg rectally daily as needed for moderate constipation (if not relieved by MOM).   bisacodyl 10 MG/30ML Enem Commonly known as:  FLEET Place 10 mg rectally daily as needed (if not relieved by Bisacodyl suppository).   calcium carbonate 1250 (500 Ca) MG chewable tablet Commonly known as:  OS-CAL Chew 1 tablet by mouth daily.   cloNIDine 0.2 mg/24hr patch Commonly known as:  CATAPRES - Dosed in mg/24 hr Place 0.2 mg onto the skin once a week.   D3-1000 25 MCG (1000 UT) tablet Generic drug:  Cholecalciferol Take 1,000 Units by mouth daily.   ertapenem 1 g in sodium chloride 0.9 % 100 mL Inject 1 g into the vein. For 10 days for ESBL E Coli   FISH OIL PO Take 5 mLs by mouth daily. 1,600 MG/5 ML LIQUID- take 5 ml po daily as supplement   levETIRAcetam 100 MG/ML solution Commonly known as:  KEPPRA Take 5 mLs (500 mg total) by mouth 2 (two) times daily.   MILK OF MAGNESIA PO Take 30 mLs by mouth daily as needed (if no BM in 3 days).   Namenda XR 28 MG Cp24 24 hr capsule Generic drug:  memantine Take 28 mg by mouth at bedtime.   Nuedexta 20-10 MG capsule Generic drug:  Dextromethorphan-quiNIDine Take 1 capsule by mouth daily.   NUTRITIONAL SUPPLEMENTS PO Initiate 120 ml Medpass 3 times daily with meals related to weight loss   feeding supplement (ENSURE ENLIVE) Liqd Take 237 mLs by mouth 4 (four) times daily.   pravastatin 40 MG tablet Commonly known as:  PRAVACHOL Take 40 mg by mouth daily.   rivastigmine 6 MG capsule Commonly known as:  EXELON Take 6 mg by mouth 2 (two) times daily.   vitamin B-12 1000 MCG tablet Commonly known as:  CYANOCOBALAMIN Take 1,000 mcg by mouth daily.        No orders of the defined types were placed in this encounter.   Immunization History  Administered Date(s) Administered  . Influenza, High Dose Seasonal PF 08/01/2016  . Influenza-Unspecified 07/31/2017, 07/05/2018  . PPD Test 12/09/2017  . Pneumococcal Conjugate-13 08/01/2016  . Tdap 12/16/2014    Social History   Tobacco Use  . Smoking status: Former Games developermoker  . Smokeless tobacco: Never Used  Substance Use Topics  . Alcohol use: No    Family history is   Family History  Problem Relation Age of Onset  . Hypertension Mother   . Peripheral vascular disease Mother        Bilateral leg amputations  . Alzheimer's disease Father   . Stroke Brother   . Heart attack Brother       Review of Systems   unable to obtain secondary to patient's underlying neurologic dysfunction and lethargy; per nursing-as per history of present illness     Vitals:   02/21/19 1456  BP: 140/71  Pulse: 60  Resp: 18  Temp: 98 F (36.7 C)    SpO2 Readings from Last 1 Encounters:  02/13/19 98%   Body mass index is 24.79 kg/m.     Physical Exam  GENERAL APPEARANCE: Somnolent, minimally conversant,  No acute distress.  SKIN: No diaphoresis rash HEAD: Normocephalic, atraumatic  EYES: Conjunctiva/lids clear. Pupils round, reactive. EOMs intact.  EARS: External exam WNL, canals clear. Hearing grossly normal.  NOSE: No deformity or discharge.  MOUTH/THROAT: Lips w/o lesions  RESPIRATORY: Breathing is even, unlabored. Lung sounds are clear   CARDIOVASCULAR: Heart RRR no murmurs, rubs or gallops. No peripheral edema.   GASTROINTESTINAL: Abdomen is soft, non-tender, not distended w/ normal bowel sounds. GENITOURINARY: Bladder non tender, not distended  MUSCULOSKELETAL: No abnormal joints or musculature NEUROLOGIC:  Cranial nerves 2-12 grossly intact. Moves all extremities  PSYCHIATRIC: Patient moderately somnolent but arousable, no behavioral issues  Patient Active Problem List    Diagnosis Date Noted  . Malnutrition of moderate degree 12/28/2018  . Palliative care by specialist   . Goals of care, counseling/discussion   . Emesis 12/27/2018  . Enterococcus UTI 04/29/2018  . CAP (community acquired pneumonia) 04/25/2018  . Multifocal pneumonia 04/22/2018  . Post-ictal state (HCC) 02/18/2018  . Urinary tract infection with hematuria   . Seizure (HCC) 02/11/2018  . Chronic constipation 02/09/2018  . Dementia (HCC) 12/16/2017  . Failure to thrive in adult 12/16/2017  . Hypernatremia 12/16/2017  . Hypokalemia 12/16/2017  . Hypertension 12/16/2017  . 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  Hyperlipidemia 12/16/2017  . Osteoporosis 12/16/2017  . Vitamin B12 deficiency 12/16/2017  . Depression 12/16/2017      Labs reviewed: Basic Metabolic Panel:    Component Value Date/Time   NA 147 (H) 12/29/2018 0554   NA 146 07/27/2018   K 3.2 (L) 12/29/2018 0554   CL 111 12/29/2018 0554   CO2 27 12/29/2018 0554   GLUCOSE 100 (H) 12/29/2018 0554   BUN 6 (L) 12/29/2018 0554   BUN 11 07/27/2018   CREATININE 0.42 (L) 12/29/2018 0554   CALCIUM 9.4 12/29/2018 0554   PROT 6.9 12/26/2018 2332   ALBUMIN 3.5 12/26/2018 2332   AST 20 12/26/2018 2332   ALT 12 12/26/2018 2332   ALKPHOS 78 12/26/2018 2332   BILITOT 0.5 12/26/2018 2332   GFRNONAA >60 12/29/2018 0554   GFRAA >60 12/29/2018 0554    Recent Labs    12/26/18 2332 12/28/18 0549 12/29/18 0554  NA 146* 147* 147*  K 3.6 3.5 3.2*  CL 109 115* 111  CO2 GLUCOSE 122* 92 100*  BUN 22 13 6*  CREATININE 0.74 0.55 0.42*  CALCIUM 9.5 8.8* 9.4   Liver Function Tests: Recent Labs    04/22/18 2034 04/23/18 0822 12/26/18 2332  AST ALT ALKPHOS 77 73 78  BILITOT 0.9 1.2 0.5  PROT 6.7 5.8* 6.9  ALBUMIN 3.3* 2.9* 3.5   Recent Labs    04/22/18 2034 12/26/18 2332  LIPASE 38 36   No results for input(s): AMMONIA in the last 8760 hours. CBC: Recent Labs    04/22/18 2034   04/23/18 0822  07/22/18 12/26/18 2332 12/28/18 0549  WBC 7.1  --  7.9   < > 7.2 4.3 8.4  NEUTROABS 3.8  --   --   --  6  --   --   HGB 13.5   < > 13.1   < > 15.4 14.3 11.5*  HCT 42.4   < > 39.8   < > 46 45.5 35.8*  MCV 96.8  --  93.4  --   --  98.5 97.3  PLT 153  --  117*   < > 120* 133* 102*   < > = values in this interval not displayed.   Lipid No results for input(s): CHOL, HDL, LDLCALC, TRIG in the last 8760 hours.  Cardiac Enzymes: No results for input(s): CKTOTAL, CKMB, CKMBINDEX, TROPONINI in the last 8760 hours. BNP: No results for input(s): BNP in the last 8760 hours. No results found for: [facility] Lab Results  Component Value Date   HGBA1C 4.2 (L) 02/12/2018   Lab Results  Component Value Date   TSH 1.25 07/23/2018   No results found for: VITAMINB12 No results found for: FOLATE No results found for: IRON, TIBC, FERRITIN  Imaging and Procedures obtained prior to SNF admission: Ct Abdomen Pelvis W Contrast  Result Date: 12/27/2018 CLINICAL DATA:  Failure to thrive. Projectile vomiting after eating. EXAM: CT ABDOMEN AND PELVIS WITH CONTRAST TECHNIQUE: Multidetector CT imaging of the abdomen and pelvis was performed using the standard protocol following bolus administration of intravenous contrast. CONTRAST:  OMNIPAQUE IOHEXOL 300 MG/ML  SOLN COMPARISON:  Report from 01/17/2010 CT FINDINGS: Lower chest: Normal heart size without pericardial effusion or thickening. Patchy bibasilar airspace disease consistent with multilobar pneumonia. No effusion or pneumothorax. Hepatobiliary: Tiny subcentimeter scattered hypodensities are noted within the liver compatible with cysts, biliary hamartomas or small hemangiomas. Status post cholecystectomy. No choledocholithiasis.  Reservoir effect with intrahepatic and extrahepatic ductal dilatation likely on the basis of prior cholecystectomy. Pancreas: No ductal dilatation, mass or inflammation. Spleen: Normal Adrenals/Urinary Tract:  Thickening of the adrenal glands. Small cystic nodule of the right adrenal gland measuring 7 mm possibly a small adenoma but is too small to further characterize. Cortical scarring within both kidneys subcentimeter cyst associated with the lower pole the right kidney and interpolar left kidney. No nephrolithiasis nor obstructive uropathy. The urinary bladder is decompressed. Stomach/Bowel: The stomach is physiologically distended without mucosal thickening. Normal small bowel rotation without obstruction. Significant stool retention throughout the colon consistent constipation with probable fecal impaction in the rectum. Unremarkable appendix. Vascular/Lymphatic: Moderate aortoiliac atherosclerosis without aneurysm. No lymphadenopathy. Reproductive: Status post hysterectomy. No adnexal masses. Other: No free air free fluid.  Mild soft tissue anasarca. Musculoskeletal: Mild grade 1 anterolisthesis of L4 on L5 likely on the basis of degenerative disc disease and facet arthropathy. Vacuum disc phenomenon noted at L3-4 and L4-5. No acute fracture or aggressive osseous lesions. IMPRESSION: 1. Patchy multilobar airspace disease at the lung bases consistent multilobar pneumonia. Stigmata of CHF believed less likely given lack of significant pleural effusion or cardiomegaly. 2. Significant stool retention throughout the colon consistent constipation with probable fecal impaction. 3. Tiny too small to characterize cysts of the liver and both kidneys. Electronically Signed   By: Tollie Eth M.D.   On: 12/27/2018 01:02     Not all labs, radiology exams or other studies done during hospitalization come through on my EPIC note; however they are reviewed by me.    Assessment and Plan  ESBL E. coli cystitis/enterococcus cystitis/failure to thrive- we will restart IV fluids with one half normal saline at 75 cc an hour and monitor response.  This may be a preview of beginning of a downward slide or it may just be the  patient is ill from her UTI and will bounce back.   Time spent greater than 35 minutes;> 50% of time with patient was spent reviewing records, labs, tests and studies, counseling and developing plan of care  Melissa Jarvis. Lyn Hollingshead, MD

## 2019-02-22 DIAGNOSIS — I10 Essential (primary) hypertension: Secondary | ICD-10-CM | POA: Diagnosis not present

## 2019-02-22 DIAGNOSIS — D649 Anemia, unspecified: Secondary | ICD-10-CM | POA: Diagnosis not present

## 2019-02-22 LAB — BASIC METABOLIC PANEL
BUN: 8 (ref 4–21)
Creatinine: 0.4 — AB (ref 0.5–1.1)
Glucose: 93
Potassium: 4.1 (ref 3.4–5.3)
Sodium: 145 (ref 137–147)

## 2019-02-25 ENCOUNTER — Encounter: Payer: Self-pay | Admitting: Internal Medicine

## 2019-02-25 DIAGNOSIS — D649 Anemia, unspecified: Secondary | ICD-10-CM | POA: Diagnosis not present

## 2019-02-25 DIAGNOSIS — I10 Essential (primary) hypertension: Secondary | ICD-10-CM | POA: Diagnosis not present

## 2019-02-25 LAB — BASIC METABOLIC PANEL
BUN: 8 (ref 4–21)
Creatinine: 0.4 — AB (ref 0.5–1.1)
Glucose: 71
Potassium: 4.8 (ref 3.4–5.3)
Sodium: 144 (ref 137–147)

## 2019-03-14 DIAGNOSIS — I69891 Dysphagia following other cerebrovascular disease: Secondary | ICD-10-CM | POA: Diagnosis not present

## 2019-03-14 DIAGNOSIS — Z9181 History of falling: Secondary | ICD-10-CM | POA: Diagnosis not present

## 2019-03-14 DIAGNOSIS — M6281 Muscle weakness (generalized): Secondary | ICD-10-CM | POA: Diagnosis not present

## 2019-03-14 DIAGNOSIS — I69398 Other sequelae of cerebral infarction: Secondary | ICD-10-CM | POA: Diagnosis not present

## 2019-03-14 DIAGNOSIS — R1311 Dysphagia, oral phase: Secondary | ICD-10-CM | POA: Diagnosis not present

## 2019-03-15 DIAGNOSIS — M6281 Muscle weakness (generalized): Secondary | ICD-10-CM | POA: Diagnosis not present

## 2019-03-15 DIAGNOSIS — I69891 Dysphagia following other cerebrovascular disease: Secondary | ICD-10-CM | POA: Diagnosis not present

## 2019-03-15 DIAGNOSIS — Z9181 History of falling: Secondary | ICD-10-CM | POA: Diagnosis not present

## 2019-03-15 DIAGNOSIS — I69398 Other sequelae of cerebral infarction: Secondary | ICD-10-CM | POA: Diagnosis not present

## 2019-03-15 DIAGNOSIS — R1311 Dysphagia, oral phase: Secondary | ICD-10-CM | POA: Diagnosis not present

## 2019-03-18 DIAGNOSIS — I69398 Other sequelae of cerebral infarction: Secondary | ICD-10-CM | POA: Diagnosis not present

## 2019-03-18 DIAGNOSIS — Z9181 History of falling: Secondary | ICD-10-CM | POA: Diagnosis not present

## 2019-03-18 DIAGNOSIS — M6281 Muscle weakness (generalized): Secondary | ICD-10-CM | POA: Diagnosis not present

## 2019-03-19 ENCOUNTER — Encounter: Payer: Self-pay | Admitting: Internal Medicine

## 2019-03-19 ENCOUNTER — Non-Acute Institutional Stay (SKILLED_NURSING_FACILITY): Payer: Medicare Other | Admitting: Internal Medicine

## 2019-03-19 DIAGNOSIS — I10 Essential (primary) hypertension: Secondary | ICD-10-CM

## 2019-03-19 DIAGNOSIS — M810 Age-related osteoporosis without current pathological fracture: Secondary | ICD-10-CM

## 2019-03-19 DIAGNOSIS — Z9181 History of falling: Secondary | ICD-10-CM | POA: Diagnosis not present

## 2019-03-19 DIAGNOSIS — K5909 Other constipation: Secondary | ICD-10-CM

## 2019-03-19 DIAGNOSIS — M6281 Muscle weakness (generalized): Secondary | ICD-10-CM | POA: Diagnosis not present

## 2019-03-19 DIAGNOSIS — I69398 Other sequelae of cerebral infarction: Secondary | ICD-10-CM | POA: Diagnosis not present

## 2019-03-19 NOTE — Progress Notes (Signed)
Location:  [facility] Room Number: 203/D Place of Service:  SNF (31)  Melissa Hanks, MD  Patient Care Team: Melissa Hanks, MD as PCP - General (Internal Medicine)  Extended Emergency Contact Information Primary Emergency Contact: Melissa Jarvis,[name] Address: [address]          [place], [place] [address] [place] Home Phone: [phone] Relation: Daughter Secondary Emergency Contact: [name] Mobile Phone: [phone] Relation: Daughter   Chief Complaint  Patient presents with   Medical Management of Chronic Issues    Routine visit of medical management   Immunizations    PPSV-23    HPI:  Pt is a 78 y.o. female seen today for medical management of chronic diseases of hypertension, constipation, and osteoporosis.   Past Medical History:  Diagnosis Date   Alzheimer's disease (HCC)    Chronic constipation    Depressed    Failure to thrive in adult    HCAP (healthcare-associated pneumonia)    Hyperlipidemia    Hypernatremia    Hypertension    Hypokalemia    Low back pain    Migraines    Osteoarthritis    Osteoporosis    PBA (pseudobulbar affect)    Post-ictal state (HCC)    Seizures (HCC)    Urinary tract infection with hematuria    Vitamin B12 deficiency    Past Surgical History:  Procedure Laterality Date   ABDOMINAL HYSTERECTOMY     CHOLECYSTECTOMY     KNEE SURGERY     total    Allergies  Allergen Reactions   Penicillins Other (See Comments)    Tolerated Ceftriaxone 04/2018 Other reaction(s): UNKNOWN REACTION      Allergies as of 03/19/2019      Reactions   Penicillins Other (See Comments)   Tolerated Ceftriaxone 04/2018 Other reaction(s): UNKNOWN REACTION      Medication List       Accurate as of March 19, 2019 11:59 PM. If you have any questions, ask your nurse or doctor.        STOP taking these medications   ertapenem 1 g in sodium chloride 0.9 % 100  mL Stopped by:  Merrilee Seashore, MD     TAKE these medications   acetaminophen 325 MG tablet Commonly known as:  TYLENOL Take 650 mg by mouth every 6 (six) hours as needed for mild pain.   amLODipine 10 MG tablet Commonly known as:  NORVASC Take 10 mg by mouth daily.   aspirin EC 81 MG EC tablet Generic drug:  aspirin Take 81 mg by mouth daily. Swallow whole.   bisacodyl 10 MG suppository Commonly known as:  DULCOLAX Place 10 mg rectally daily as needed for moderate constipation (if not relieved by MOM).   calcium carbonate 1250 (500 Ca) MG chewable tablet Commonly known as:  OS-CAL Chew 1 tablet by mouth daily.   cloNIDine 0.2 mg/24hr patch Commonly known as:  CATAPRES - Dosed in mg/24 hr Place 0.2 mg onto the skin once a week.   D3-1000 25 MCG (1000 UT) tablet Generic drug:  Cholecalciferol Take 1,000 Units by mouth daily.   FISH OIL PO Take 5 mLs by mouth daily. 1,600 MG/5 ML LIQUID- take 5 ml po daily as supplement   levETIRAcetam 100 MG/ML solution Commonly known as:  KEPPRA Take 5 mLs (500 mg total) by mouth 2 (two) times daily.   MILK OF MAGNESIA PO Take 30 mLs by mouth daily as needed (if  no BM in 3 days).   Namenda XR 28 MG Cp24 24 hr capsule Generic drug:  memantine Take 28 mg by mouth at bedtime.   Nuedexta 20-10 MG capsule Generic drug:  Dextromethorphan-quiNIDine Take 1 capsule by mouth daily.   NUTRITIONAL SUPPLEMENTS PO Initiate 120 ml Medpass 3 times daily with meals related to weight loss   feeding supplement (ENSURE ENLIVE) Liqd Take 237 mLs by mouth 4 (four) times daily.   pravastatin 40 MG tablet Commonly known as:  PRAVACHOL Take 40 mg by mouth daily.   RA SALINE ENEMA RE If not relieved by Biscodyl suppository, give disposable Saline Enema rectally X 1 dose/24 hrs as needed (Do not use constipation standing orders for residents with renal failure/CFR less than 30. Contact MD for orders)(Physician Or   rivastigmine 6 MG  capsule Commonly known as:  EXELON Take 6 mg by mouth 2 (two) times daily.   vitamin B-12 1000 MCG tablet Commonly known as:  CYANOCOBALAMIN Take 1,000 mcg by mouth daily.       Review of Systems  DATA OBTAINED: from patient, nursing GENERAL:  no fevers, fatigue, appetite changes SKIN: No itching, rash HEENT: No complaint RESPIRATORY: No cough, wheezing, SOB CARDIAC: No chest pain, palpitations, lower extremity edema  GI: No abdominal pain pain, No N/V/D or constipation, No heartburn or reflux  GU: No dysuria, frequency or urgency, or incontinence  MUSCULOSKELETAL: No unrelieved bone/joint pain NEUROLOGIC: No headache, dizziness  PSYCHIATRIC: No overt anxiety or sadness   Immunization History  Administered Date(s) Administered   Influenza, High Dose Seasonal PF 08/01/2016   Influenza-Unspecified 07/31/2017, 07/05/2018   PPD Test 12/09/2017   Pneumococcal Conjugate-13 08/01/2016   Tdap 12/16/2014   Pertinent  Health Maintenance Due  Topic Date Due   PNA vac Low Risk Adult (2 of 2 - PPSV23) 03/24/2019 (Originally 08/01/2017)   INFLUENZA VACCINE  05/18/2019   DEXA SCAN  Discontinued   Fall Risk  03/06/2018  Falls in the past year? No    Vitals:   03/19/19 1431  BP: 122/70  Pulse: 70  Resp: 18  Temp: 98 F (36.7 C)  TempSrc: Oral  SpO2: 97%  Weight: 152 lb 9.6 oz (69.2 kg)  Height:  (1.676 m)   Body mass index is 24.63 kg/m.  Physical Exam  GENERAL APPEARANCE: Alert,  no acute distress  SKIN: No diaphoresis rash HEENT: Unremarkable RESPIRATORY: Breathing is even, unlabored. Lung sounds are clear   CARDIOVASCULAR: Heart RRR no murmurs, rubs or gallops. No peripheral edema  GASTROINTESTINAL: Abdomen is soft, non-tender, not distended w/ normal bowel sounds.  GENITOURINARY: Bladder non tender, not distended  MUSCULOSKELETAL: No abnormal joints or musculature NEUROLOGIC: Cranial nerves 2-12 grossly intact. Moves all extremities PSYCHIATRIC:  Mood and affect appropriate to situation, no behavioral issues  Labs reviewed: Recent Labs    12/26/18 2332 12/28/18 0549 12/29/18 0554 02/21/19  NA 146* 147* 147* 145  K 3.6 3.5 3.2* 4.1  CL 109 115* 111  --   CO2 --   GLUCOSE 122* 92 100*  --   BUN 22 13 6* 8  CREATININE 0.74 0.55 0.42* 0.4*  CALCIUM 9.5 8.8* 9.4  --    Recent Labs    04/22/18 2034 04/23/18 0822 12/26/18 2332 02/21/19  AST ALT ALKPHOS 77 73 78 101  BILITOT 0.9 1.2 0.5  --   PROT 6.7 5.8* 6.9  --   ALBUMIN  3.3* 2.9* 3.5  --    Recent Labs    04/22/18 2034  04/23/18 0822  07/22/18 12/26/18 2332 12/28/18 0549 02/21/19  WBC 7.1  --  7.9   < > 7.2 4.3 8.4 6.5  NEUTROABS 3.8  --   --   --  6  --   --   --   HGB 13.5   < > 13.1   < > 15.4 14.3 11.5* 13.8  HCT 42.4   < > 39.8   < > 46 45.5 35.8* 40  MCV 96.8  --  93.4  --   --  98.5 97.3  --   PLT 153  --  117*   < > 120* 133* 102* 143*   < > = values in this interval not displayed.   Recent Labs    02/21/19  CHOL 130  LDLCALC 68  TRIG 82   [facility] results found for: [facility] Results  Component Value Date   TSH 1.30 02/21/2019   Lab Results  Component Value Date   HGBA1C 5.5 02/21/2019   Lab Results  Component Value Date   CHOL 130 02/21/2019   HDL 45 02/21/2019   LDLCALC 68 02/21/2019   TRIG 82 02/21/2019   CHOLHDL 3.1 02/12/2018    Significant Diagnostic Results in last 30 days:  No results found.  Assessment/Plan Hypertension Controlled; continue clonidine 0.2 mg 24-hour patch as patient was inconsistent with taking meds p.o., and Norvasc 10 mg p.o.  Chronic constipation No reported problems; continue daily MiraLAX 17 g along with as needed Dulcolax suppository and fleets enemas  Osteoporosis Stable, no fractures; continue Os-Cal 1250 mg chewables 1 daily and vitamin D thousand units daily     Misaki Sozio D. Eriyonna Matsushita MD.

## 2019-03-20 DIAGNOSIS — Z9181 History of falling: Secondary | ICD-10-CM | POA: Diagnosis not present

## 2019-03-20 DIAGNOSIS — I69398 Other sequelae of cerebral infarction: Secondary | ICD-10-CM | POA: Diagnosis not present

## 2019-03-20 DIAGNOSIS — M6281 Muscle weakness (generalized): Secondary | ICD-10-CM | POA: Diagnosis not present

## 2019-03-21 DIAGNOSIS — M6281 Muscle weakness (generalized): Secondary | ICD-10-CM | POA: Diagnosis not present

## 2019-03-21 DIAGNOSIS — I69398 Other sequelae of cerebral infarction: Secondary | ICD-10-CM | POA: Diagnosis not present

## 2019-03-21 DIAGNOSIS — Z9181 History of falling: Secondary | ICD-10-CM | POA: Diagnosis not present

## 2019-03-22 DIAGNOSIS — I69398 Other sequelae of cerebral infarction: Secondary | ICD-10-CM | POA: Diagnosis not present

## 2019-03-22 DIAGNOSIS — M6281 Muscle weakness (generalized): Secondary | ICD-10-CM | POA: Diagnosis not present

## 2019-03-22 DIAGNOSIS — Z9181 History of falling: Secondary | ICD-10-CM | POA: Diagnosis not present

## 2019-03-23 ENCOUNTER — Encounter: Payer: Self-pay | Admitting: Internal Medicine

## 2019-03-23 NOTE — Assessment & Plan Note (Signed)
Controlled; continue clonidine 0.2 mg 24-hour patch as patient was inconsistent with taking meds p.o., and Norvasc 10 mg p.o.

## 2019-03-23 NOTE — Assessment & Plan Note (Signed)
No reported problems; continue daily MiraLAX 17 g along with as needed Dulcolax suppository and fleets enemas

## 2019-03-23 NOTE — Assessment & Plan Note (Signed)
Stable, no fractures; continue Os-Cal 1250 mg chewables 1 daily and vitamin D thousand units daily

## 2019-03-25 DIAGNOSIS — I69398 Other sequelae of cerebral infarction: Secondary | ICD-10-CM | POA: Diagnosis not present

## 2019-03-25 DIAGNOSIS — Z9181 History of falling: Secondary | ICD-10-CM | POA: Diagnosis not present

## 2019-03-25 DIAGNOSIS — M6281 Muscle weakness (generalized): Secondary | ICD-10-CM | POA: Diagnosis not present

## 2019-03-26 DIAGNOSIS — I69398 Other sequelae of cerebral infarction: Secondary | ICD-10-CM | POA: Diagnosis not present

## 2019-03-26 DIAGNOSIS — Z9181 History of falling: Secondary | ICD-10-CM | POA: Diagnosis not present

## 2019-03-26 DIAGNOSIS — M6281 Muscle weakness (generalized): Secondary | ICD-10-CM | POA: Diagnosis not present

## 2019-04-18 ENCOUNTER — Non-Acute Institutional Stay (SKILLED_NURSING_FACILITY): Payer: Medicare Other | Admitting: Internal Medicine

## 2019-04-18 ENCOUNTER — Encounter: Payer: Self-pay | Admitting: Internal Medicine

## 2019-04-18 DIAGNOSIS — F0150 Vascular dementia without behavioral disturbance: Secondary | ICD-10-CM | POA: Diagnosis not present

## 2019-04-18 DIAGNOSIS — E785 Hyperlipidemia, unspecified: Secondary | ICD-10-CM | POA: Diagnosis not present

## 2019-04-18 DIAGNOSIS — R627 Adult failure to thrive: Secondary | ICD-10-CM

## 2019-04-18 NOTE — Progress Notes (Signed)
[facility] Room Number: 203-D Place of Service:  SNF (31)  [name], Kidus Delman D, MD  Patient Care Team: [name], Linzey Ramser D, MD as PCP - General (Internal Medicine)  Extended Emergency Contact Information Primary Emergency Contact: [name],[name] Address: [address]          [place], [place] [address] [place] Home Phone: [phone] Relation: Daughter Secondary Emergency Contact: [name], Tracy Mobile Phone: [phone] Relation: Daughter    Allergies: Penicillins  Chief Complaint  Patient presents with  . Medical Management of Chronic Issues    Routine [facility] visit    HPI: Patient is a 78 y.o. female who is being seen for routine issues of dementia, failure to thrive, and hyperlipidemia.  Past Medical History:  Diagnosis Date  . Alzheimer's disease (HCC)   . Chronic constipation   . Depressed   . Failure to thrive in adult   . HCAP (healthcare-associated pneumonia)   . Hyperlipidemia   . Hypernatremia   . Hypertension   . Hypokalemia   . Low back pain   . Migraines   . Osteoarthritis   . Osteoporosis   . PBA (pseudobulbar affect)   . Post-ictal state (HCC)   . Seizures (HCC)   . Urinary tract infection with hematuria   . Vitamin B12 deficiency     Past Surgical History:  Procedure Laterality Date  . ABDOMINAL HYSTERECTOMY    . CHOLECYSTECTOMY    . KNEE SURGERY     total    Allergies as of 04/18/2019      Reactions   Penicillins Other (See Comments)   Tolerated Ceftriaxone 04/2018 Other reaction(s): UNKNOWN REACTION      Medication List       Accurate as of April 18, 2019 11:59 PM. If you have any questions, ask your nurse or doctor.        acetaminophen 325 MG tablet Commonly known as: TYLENOL Take 650 mg by mouth every 6 (six) hours as needed for mild pain.   amLODipine 10 MG tablet Commonly known as: NORVASC Take 10 mg by mouth daily.   aspirin EC 81 MG EC tablet Generic  drug: aspirin Take 81 mg by mouth daily. Swallow whole.   bisacodyl 10 MG suppository Commonly known as: DULCOLAX Place 10 mg rectally daily as needed for moderate constipation (if not relieved by MOM).   calcium carbonate 1250 (500 Ca) MG chewable tablet Commonly known as: OS-CAL Chew 1 tablet by mouth daily.   cloNIDine 0.2 mg/24hr patch Commonly known as: CATAPRES - Dosed in mg/24 hr Place 0.2 mg onto the skin once a week.   D3-1000 25 MCG (1000 UT) tablet Generic drug: Cholecalciferol Take 1,000 Units by mouth daily.   feeding supplement (ENSURE ENLIVE) Liqd Take 237 mLs by mouth 4 (four) times daily. What changed: Another medication with the same name was removed. Continue taking this medication, and follow the directions you see here. Changed by: Merrilee SeashoreAnne Gerome Kokesh, MD   NUTRITIONAL SUPPLEMENT PO Take 1 each by mouth 3 (three) times daily. Magic Cup What changed: Another medication with the same name was removed. Continue taking this medication, and follow the directions you see here. Changed by: Merrilee SeashoreAnne Elveria Lauderbaugh, MD   FISH OIL PO Take 5 mLs by mouth daily. 1,600 MG/5 ML LIQUID- take 5 ml po daily as supplement   levETIRAcetam 100 MG/ML solution Commonly known as: KEPPRA Take 5 mLs (500 mg total) by mouth 2 (two)  times daily.   MILK OF MAGNESIA PO Take 30 mLs by mouth daily as needed (if no BM in 3 days).   Namenda XR 28 MG Cp24 24 hr capsule Generic drug: memantine Take 28 mg by mouth at bedtime.   Nuedexta 20-10 MG capsule Generic drug: Dextromethorphan-quiNIDine Take 1 capsule by mouth daily.   pravastatin 40 MG tablet Commonly known as: PRAVACHOL Take 40 mg by mouth daily.   RA SALINE ENEMA RE If not relieved by Biscodyl suppository, give disposable Saline Enema rectally X 1 dose/24 hrs as needed (Do not use constipation standing orders for residents with renal failure/CFR less than 30. Contact MD for orders)(Physician Or   rivastigmine 6 MG capsule Commonly  known as: EXELON Take 6 mg by mouth 2 (two) times daily.   vitamin B-12 1000 MCG tablet Commonly known as: CYANOCOBALAMIN Take 1,000 mcg by mouth daily.       No orders of the defined types were placed in this encounter.   Immunization History  Administered Date(s) Administered  . Influenza, High Dose Seasonal PF 08/01/2016  . Influenza-Unspecified 07/31/2017, 07/05/2018  . PPD Test 12/09/2017  . Pneumococcal Conjugate-13 08/01/2016  . Tdap 12/16/2014    Social History   Tobacco Use  . Smoking status: Former Games developermoker  . Smokeless tobacco: Never Used  Substance Use Topics  . Alcohol use: No    Review of Systems unable to obtain secondary to dementia; nursing-no acute concerns    Vitals:   04/18/19 1134  BP: 124/68  Pulse: 70  Resp: 18  Temp: (!) 97.4 F (36.3 C)   Body mass index is 25.28 kg/m. Physical Exam  GENERAL APPEARANCE: Alert, minimally conversant, No acute distress  SKIN: No diaphoresis rash HEENT: Unremarkable RESPIRATORY: Breathing is even, unlabored. Lung sounds are clear   CARDIOVASCULAR: Heart RRR no murmurs, rubs or gallops. No peripheral edema  GASTROINTESTINAL: Abdomen is soft, non-tender, not distended w/ normal bowel sounds.  GENITOURINARY: Bladder non tender, not distended  MUSCULOSKELETAL: No abnormal joints or musculature NEUROLOGIC: Cranial nerves 2-12 grossly intact. Moves all extremities PSYCHIATRIC: Mood and affect flat, dementia, no behavioral issues  Patient Active Problem List   Diagnosis Date Noted  . Malnutrition of moderate degree 12/28/2018  . Palliative care by specialist   . Goals of care, counseling/discussion   . Emesis 12/27/2018  . Enterococcus UTI 04/29/2018  . CAP (community acquired pneumonia) 04/25/2018  . Multifocal pneumonia 04/22/2018  . Post-ictal state (HCC) 02/18/2018  . Urinary tract infection with hematuria   . Seizure (HCC) 02/11/2018  . Chronic constipation 02/09/2018  . Dementia (HCC) 12/16/2017   . Failure to thrive in adult 12/16/2017  . Hypernatremia 12/16/2017  . Hypokalemia 12/16/2017  . Hypertension 12/16/2017  . 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 Hyperlipidemia 12/16/2017  . Osteoporosis 12/16/2017  . Vitamin B12 deficiency 12/16/2017  . Depression 12/16/2017    CMP     Component Value Date/Time   NA 145 02/21/2019   K 4.1 02/21/2019   CL 111 12/29/2018 0554   CO2 27 12/29/2018 0554   GLUCOSE 100 (H) 12/29/2018 0554   BUN 8 02/21/2019   CREATININE 0.4 (A) 02/21/2019   CREATININE 0.42 (L) 12/29/2018 0554   CALCIUM 9.4 12/29/2018 0554   PROT 6.9 12/26/2018 2332   ALBUMIN 3.5 12/26/2018 2332   AST 23 02/21/2019   ALT 30 02/21/2019   ALKPHOS 101 02/21/2019   BILITOT 0.5 12/26/2018 2332   GFRNONAA >60 12/29/2018 0554   GFRAA >60 12/29/2018 0554  Recent Labs    12/26/18 2332 12/28/18 0549 12/29/18 0554 02/21/19  NA 146* 147* 147* 145  K 3.6 3.5 3.2* 4.1  CL 109 115* 111  --   CO2 27 27 27   --   GLUCOSE 122* 92 100*  --   BUN 22 13 6* 8  CREATININE 0.74 0.55 0.42* 0.4*  CALCIUM 9.5 8.8* 9.4  --    Recent Labs    04/22/18 2034 04/23/18 0822 12/26/18 2332 02/21/19  AST 20 23 20 23   ALT 11 10 12 30   ALKPHOS 77 73 78 101  BILITOT 0.9 1.2 0.5  --   PROT 6.7 5.8* 6.9  --   ALBUMIN 3.3* 2.9* 3.5  --    Recent Labs    04/22/18 2034  04/23/18 0822  07/22/18 12/26/18 2332 12/28/18 0549 02/21/19  WBC 7.1  --  7.9   < > 7.2 4.3 8.4 6.5  NEUTROABS 3.8  --   --   --  6  --   --   --   HGB 13.5   < > 13.1   < > 15.4 14.3 11.5* 13.8  HCT 42.4   < > 39.8   < > 46 45.5 35.8* 40  MCV 96.8  --  93.4  --   --  98.5 97.3  --   PLT 153  --  117*   < > 120* 133* 102* 143*   < > = values in this interval not displayed.   Recent Labs    02/21/19  CHOL 130  LDLCALC 68  TRIG 82   No results found for: [facility] Lab Results  Component Value Date   TSH 1.30 02/21/2019   Lab Results  Component Value Date   HGBA1C 5.5 02/21/2019   Lab Results   Component Value Date   CHOL 130 02/21/2019   HDL 45 02/21/2019   LDLCALC 68 02/21/2019   TRIG 82 02/21/2019   CHOLHDL 3.1 02/12/2018    Significant Diagnostic Results in last 30 days:  No results found.  Assessment and Plan  Dementia ([place]) Chronic and stable; continue Exelon 6 mg twice daily and Namenda XR 28 mg daily  Failure to thrive in adult Patient waxes and wanes and eating and drinking; continue supportive care  Hyperlipidemia LDL 68, HDL 45-good control; continue Pravachol 40 mg daily     Hennie Duos, MD

## 2019-04-20 ENCOUNTER — Encounter: Payer: Self-pay | Admitting: Internal Medicine

## 2019-04-20 NOTE — Assessment & Plan Note (Signed)
LDL 68, HDL 45-good control; continue Pravachol 40 mg daily

## 2019-04-20 NOTE — Assessment & Plan Note (Signed)
Chronic and stable; continue Exelon 6 mg twice daily and Namenda XR 28 mg daily

## 2019-04-20 NOTE — Assessment & Plan Note (Signed)
Patient waxes and wanes and eating and drinking; continue supportive care

## 2019-05-08 DIAGNOSIS — R1312 Dysphagia, oropharyngeal phase: Secondary | ICD-10-CM | POA: Diagnosis not present

## 2019-05-09 DIAGNOSIS — R1312 Dysphagia, oropharyngeal phase: Secondary | ICD-10-CM | POA: Diagnosis not present

## 2019-05-10 DIAGNOSIS — R1312 Dysphagia, oropharyngeal phase: Secondary | ICD-10-CM | POA: Diagnosis not present

## 2019-05-13 DIAGNOSIS — R1312 Dysphagia, oropharyngeal phase: Secondary | ICD-10-CM | POA: Diagnosis not present

## 2019-05-14 DIAGNOSIS — R1312 Dysphagia, oropharyngeal phase: Secondary | ICD-10-CM | POA: Diagnosis not present

## 2019-05-15 DIAGNOSIS — R1312 Dysphagia, oropharyngeal phase: Secondary | ICD-10-CM | POA: Diagnosis not present

## 2019-05-16 DIAGNOSIS — R1312 Dysphagia, oropharyngeal phase: Secondary | ICD-10-CM | POA: Diagnosis not present

## 2019-05-18 DIAGNOSIS — R1312 Dysphagia, oropharyngeal phase: Secondary | ICD-10-CM | POA: Diagnosis not present

## 2019-05-18 DIAGNOSIS — I69398 Other sequelae of cerebral infarction: Secondary | ICD-10-CM | POA: Diagnosis not present

## 2019-05-18 DIAGNOSIS — M810 Age-related osteoporosis without current pathological fracture: Secondary | ICD-10-CM | POA: Diagnosis not present

## 2019-05-18 DIAGNOSIS — Z9181 History of falling: Secondary | ICD-10-CM | POA: Diagnosis not present

## 2019-05-18 DIAGNOSIS — M6281 Muscle weakness (generalized): Secondary | ICD-10-CM | POA: Diagnosis not present

## 2019-05-20 ENCOUNTER — Non-Acute Institutional Stay (SKILLED_NURSING_FACILITY): Payer: Medicare Other | Admitting: Internal Medicine

## 2019-05-20 ENCOUNTER — Encounter: Payer: Self-pay | Admitting: Internal Medicine

## 2019-05-20 DIAGNOSIS — R627 Adult failure to thrive: Secondary | ICD-10-CM | POA: Diagnosis not present

## 2019-05-20 DIAGNOSIS — I69398 Other sequelae of cerebral infarction: Secondary | ICD-10-CM | POA: Diagnosis not present

## 2019-05-20 DIAGNOSIS — F0150 Vascular dementia without behavioral disturbance: Secondary | ICD-10-CM

## 2019-05-20 DIAGNOSIS — M810 Age-related osteoporosis without current pathological fracture: Secondary | ICD-10-CM | POA: Diagnosis not present

## 2019-05-20 DIAGNOSIS — R569 Unspecified convulsions: Secondary | ICD-10-CM | POA: Diagnosis not present

## 2019-05-20 DIAGNOSIS — R1312 Dysphagia, oropharyngeal phase: Secondary | ICD-10-CM | POA: Diagnosis not present

## 2019-05-20 DIAGNOSIS — M6281 Muscle weakness (generalized): Secondary | ICD-10-CM | POA: Diagnosis not present

## 2019-05-20 DIAGNOSIS — I10 Essential (primary) hypertension: Secondary | ICD-10-CM | POA: Diagnosis not present

## 2019-05-20 DIAGNOSIS — Z9181 History of falling: Secondary | ICD-10-CM | POA: Diagnosis not present

## 2019-05-20 NOTE — Progress Notes (Signed)
Location:  [facility] Room Number: 203-D Place of Service:  SNF [phone]) Provider:  Edmon CrapeLassen, Avelardo Reesman, PA-C  [name], Anne D, MD  Patient Care Team: [name], Anne D, MD as PCP - General (Internal Medicine)  Extended Emergency Contact Information Primary Emergency Contact: [name],[name] Address: [address]          [place], [place] [address] [place] Home Phone: [phone] Relation: Daughter Secondary Emergency Contact: [name], Tracy Mobile Phone: [phone] Relation: Daughter  Code Status:  Full Code Goals of care: Advanced Directive information Advanced Directives 03/19/2019  Does Patient Have a Medical Advance Directive? Yes  Type of Advance Directive (No Data)  Does patient want to make changes to medical advance directive? No - Patient declined  Would patient like information on creating a medical advance directive? -  Pre-existing out of facility DNR order (yellow form or pink MOST form) -     Chief Complaint  Patient presents with   Medical Management of Chronic Issues    Routine [facility] visit  Management of chronic medical conditions including dementia- hypertension-osteoporosis- hyperlipidemia- seizure disorder- failure to thrive- pseudobulbar affect  HPI:  Pt is a 78 y.o. female seen today for medical management of chronic diseases.  As noted above.  Nursing does not report any recent acute issues.  She does have a history of failure to thrive weight most recently 142.8 her BMI was 23.05-- speaking with nursing apparently she may be eating bit better --- being fed by staff--but it appears her p.o. intake and weight does fluctuate  She does have a history of significant dementia with pseudobulbar affect--and continues on Exelon as well as Namenda in addition to Nuedexta---  In regards to hypertension she is on clonidine patch 0.2 mg and Norvasc 10 mg a day-apparently she is on the patch secondary to being  somewhat noncompliant with taking her p.o. meds.  Blood pressures appear to be somewhat elevated recently 142/80-151/90- 128/76 I do see a systolic in the 180s as well and appears baseline is more in the 140s-150s range.  She also has a history of hyperlipidemia she is on pravastatin 40 mg a day LDL was 68 on the lab done in May she is also on fish oil.  Currently she is lying in bed comfortably does not really verbalize-she does make eye contact and is alert  Past Medical History:  Diagnosis Date   Alzheimer's disease (HCC)    Chronic constipation    Depressed    Failure to thrive in adult    HCAP (healthcare-associated pneumonia)    Hyperlipidemia    Hypernatremia    Hypertension    Hypokalemia    Low back pain    Migraines    Osteoarthritis    Osteoporosis    PBA (pseudobulbar affect)    Post-ictal state (HCC)    Seizures (HCC)    Urinary tract infection with hematuria    Vitamin B12 deficiency    Past Surgical History:  Procedure Laterality Date   ABDOMINAL HYSTERECTOMY     CHOLECYSTECTOMY     KNEE SURGERY     total    Allergies  Allergen Reactions   Penicillins Other (See Comments)    Tolerated Ceftriaxone 04/2018 Other reaction(s): UNKNOWN REACTION      Outpatient Encounter Medications as of 05/20/2019  Medication Sig   acetaminophen (TYLENOL) 325 MG tablet Take 650 mg by mouth every 6 (six) hours as needed for mild pain.  amLODipine (NORVASC) 10 MG tablet Take 10 mg by mouth daily.   Ascorbic Acid (VITAMIN C) 1000 MG tablet Take 1,000 mg by mouth daily.   aspirin (ASPIRIN EC) 81 MG EC tablet Take 81 mg by mouth daily. Swallow whole.    bisacodyl (DULCOLAX) 10 MG suppository Place 10 mg rectally daily as needed for moderate constipation (if not relieved by MOM).    calcium carbonate (OS-CAL) 1250 (500 Ca) MG chewable tablet Chew 1 tablet by mouth daily.   Cholecalciferol (D3-1000) 1000 units tablet Take 1,000 Units by mouth  daily.   cloNIDine (CATAPRES - DOSED IN MG/24 HR) 0.2 mg/24hr patch Place 0.2 mg onto the skin once a week.    Dextromethorphan-quiNIDine (NUEDEXTA) 20-10 MG CAPS Take 1 capsule by mouth daily.    levETIRAcetam (KEPPRA) 100 MG/ML solution Take 5 mLs (500 mg total) by mouth 2 (two) times daily.   Magnesium Hydroxide (MILK OF MAGNESIA PO) Take 30 mLs by mouth daily as needed (if no BM in 3 days).    memantine (NAMENDA XR) 28 MG CP24 24 hr capsule Take 28 mg by mouth at bedtime.    Nutritional Supplements (FEEDING SUPPLEMENT, BOOST BREEZE,) LIQD Take 237 mLs by mouth 2 (two) times daily.   Nutritional Supplements (NUTRITIONAL SUPPLEMENT PO) Take 1 each by mouth 3 (three) times daily. Magic Cup   Omega-3 Fatty Acids (FISH OIL PO) Take 5 mLs by mouth daily. 1,600 MG/5 ML LIQUID- take 5 ml po daily as supplement    pravastatin (PRAVACHOL) 40 MG tablet Take 40 mg by mouth daily.   rivastigmine (EXELON) 6 MG capsule Take 6 mg by mouth 2 (two) times daily.    Sodium Phosphates (RA SALINE ENEMA RE) If not relieved by Biscodyl suppository, give disposable Saline Enema rectally X 1 dose/24 hrs as needed (Do not use constipation standing orders for residents with renal failure/CFR less than 30. Contact MD for orders)(Physician Or   vitamin B-12 (CYANOCOBALAMIN) 1000 MCG tablet Take 1,000 mcg by mouth daily.   Zinc Sulfate 220 (50 Zn) MG TABS Take 1 tablet by mouth.   [DISCONTINUED] feeding supplement, ENSURE ENLIVE, (ENSURE ENLIVE) LIQD Take 237 mLs by mouth 4 (four) times daily.   No facility-administered encounter medications on file as of 05/20/2019.     Review of Systems   Is unobtainable secondary to dementia  Immunization History  Administered Date(s) Administered   Influenza, High Dose Seasonal PF 08/01/2016   Influenza-Unspecified 07/31/2017, 07/05/2018   PPD Test 12/09/2017   Pneumococcal Conjugate-13 08/01/2016   Tdap 12/16/2014   Pertinent  Health Maintenance Due  Topic  Date Due   PNA vac Low Risk Adult (2 of 2 - PPSV23) 08/01/2017   INFLUENZA VACCINE  05/18/2019   DEXA SCAN  Discontinued   Fall Risk  03/06/2018  Falls in the past year? No   Functional Status Survey:    Vitals:   05/20/19 1011  BP: 128/76  Pulse: 78  Resp: 18  Temp: (!) 97.2 F (36.2 C)  TempSrc: Oral  Weight: 142 lb 12.8 oz (64.8 kg)  Height: 5\' 6"  (1.676 m)  --- Serial blood pressures as noted above again she appears to have some elevations with baseline systolic in the 244W-102V range Body mass index is 23.05 kg/m. Physical Exam In general this is an elderly female in no distress lying comfortably in bed.  Skin is warm and dry.  Eyes visual acuity appears to be intact sclera and conjunctive are clear.  Oropharynx was somewhat  difficult to visualize since patient did not open her mouth very wide  Chest is clear to auscultation with somewhat poor respiratory effort there is no labored breathing.  Heart is regular rate and rhythm without murmur gallop or rub she does not have significant lower extremity edema. Much.   Her abdomen is soft nontender with positive bowel sounds.  Musculoskeletal Limited exam since she did not really follow verbal commands--he does move her arms bilaterally strength appears to be fairly intact- she does hold her right lower extremity in a somewhat semi-contracted position  --Neurologic she is alert cranial nerves appear to be grossly intact  Psych again is not verbalizing much appears to have somewhat of a flat affect she is not agitated with exam  labs reviewed: Recent Labs    12/26/18 2332 12/28/18 0549 12/29/18 0554 02/21/19  NA 146* 147* 147* 145  K 3.6 3.5 3.2* 4.1  CL 109 115* 111  --   CO2 27 27 27   --   GLUCOSE 122* 92 100*  --   BUN 22 13 6* 8  CREATININE 0.74 0.55 0.42* 0.4*  CALCIUM 9.5 8.8* 9.4  --    Recent Labs    12/26/18 2332 02/21/19  AST 20 23  ALT 12 30  ALKPHOS 78 101  BILITOT 0.5  --   PROT 6.9   --   ALBUMIN 3.5  --    Recent Labs    07/22/18 12/26/18 2332 12/28/18 0549 02/21/19  WBC 7.2 4.3 8.4 6.5  NEUTROABS 6  --   --   --   HGB 15.4 14.3 11.5* 13.8  HCT 46 45.5 35.8* 40  MCV  --  98.5 97.3  --   PLT 120* 133* 102* 143*   Lab Results  Component Value Date   TSH 1.30 02/21/2019   Lab Results  Component Value Date   HGBA1C 5.5 02/21/2019   Lab Results  Component Value Date   CHOL 130 02/21/2019   HDL 45 02/21/2019   LDLCALC 68 02/21/2019   TRIG 82 02/21/2019   CHOLHDL 3.1 02/12/2018    Significant Diagnostic Results in last 30 days:  No results found.  Assessment/Plan Elevated #1 hypertension as noted above--her systolics appear to be somewhat elevated--we will increase her clonidine patch 0.3 mg q. weekly --continue Norvasc 10 mg a day as well  #2 dementia continues on Exelon 6 mg twice daily and Namenda 28 mg a day-at this point continue supportive care encourage p.o. intake- no recent behavioral issues have been noted.  3.  Failure to thrive- she does have a history of spotty p.o. intake talking with nursing assistant today apparently she is doing possibly a bit better--but this will have to be encouraged strongly-we will update a metabolic panel she does have some history of hypernatremia in the past  #4 osteoporosis she continues on calcium as well as vitamin D will update a vitamin D level she is on supplementation last level was 22.9 back in May  #5- history of hyperlipidemia she is on pravastatin 40 mg a day LDL was 68 on May lab- she continues on fish oil as well.  6.  History of seizure disorder she continues on Keppra.  7.  History of pseudobulbar affect continues on Nuedexta.  .  8.-I do note she is on B12 as well will update this level as well  [phone]

## 2019-05-21 DIAGNOSIS — M6281 Muscle weakness (generalized): Secondary | ICD-10-CM | POA: Diagnosis not present

## 2019-05-21 DIAGNOSIS — I69398 Other sequelae of cerebral infarction: Secondary | ICD-10-CM | POA: Diagnosis not present

## 2019-05-21 DIAGNOSIS — D649 Anemia, unspecified: Secondary | ICD-10-CM | POA: Diagnosis not present

## 2019-05-21 DIAGNOSIS — D519 Vitamin B12 deficiency anemia, unspecified: Secondary | ICD-10-CM | POA: Diagnosis not present

## 2019-05-21 DIAGNOSIS — R1312 Dysphagia, oropharyngeal phase: Secondary | ICD-10-CM | POA: Diagnosis not present

## 2019-05-21 DIAGNOSIS — Z79899 Other long term (current) drug therapy: Secondary | ICD-10-CM | POA: Diagnosis not present

## 2019-05-21 DIAGNOSIS — E559 Vitamin D deficiency, unspecified: Secondary | ICD-10-CM | POA: Diagnosis not present

## 2019-05-21 DIAGNOSIS — Z9181 History of falling: Secondary | ICD-10-CM | POA: Diagnosis not present

## 2019-05-21 DIAGNOSIS — M810 Age-related osteoporosis without current pathological fracture: Secondary | ICD-10-CM | POA: Diagnosis not present

## 2019-05-21 LAB — BASIC METABOLIC PANEL
BUN: 8 (ref 4–21)
CO2: 27 — AB (ref 13–22)
Chloride: 104 (ref 99–108)
Creatinine: 0.4 — AB (ref 0.5–1.1)
Glucose: 93
Potassium: 3.9 (ref 3.4–5.3)
Sodium: 145 (ref 137–147)

## 2019-05-21 LAB — COMPREHENSIVE METABOLIC PANEL
Calcium: 9.5 (ref 8.7–10.7)
GFR calc Af Amer: 90
GFR calc non Af Amer: 90

## 2019-05-21 LAB — VITAMIN D 25 HYDROXY (VIT D DEFICIENCY, FRACTURES): Vit D, 25-Hydroxy: 37.17

## 2019-05-21 LAB — VITAMIN B12: Vitamin B-12: 422

## 2019-05-22 DIAGNOSIS — I69398 Other sequelae of cerebral infarction: Secondary | ICD-10-CM | POA: Diagnosis not present

## 2019-05-22 DIAGNOSIS — Z9181 History of falling: Secondary | ICD-10-CM | POA: Diagnosis not present

## 2019-05-22 DIAGNOSIS — M6281 Muscle weakness (generalized): Secondary | ICD-10-CM | POA: Diagnosis not present

## 2019-05-22 DIAGNOSIS — M810 Age-related osteoporosis without current pathological fracture: Secondary | ICD-10-CM | POA: Diagnosis not present

## 2019-05-22 DIAGNOSIS — R1312 Dysphagia, oropharyngeal phase: Secondary | ICD-10-CM | POA: Diagnosis not present

## 2019-05-24 ENCOUNTER — Non-Acute Institutional Stay (SKILLED_NURSING_FACILITY): Payer: Medicare Other | Admitting: Internal Medicine

## 2019-05-24 DIAGNOSIS — U071 COVID-19: Secondary | ICD-10-CM

## 2019-05-28 DIAGNOSIS — M6281 Muscle weakness (generalized): Secondary | ICD-10-CM | POA: Diagnosis not present

## 2019-05-28 DIAGNOSIS — I69398 Other sequelae of cerebral infarction: Secondary | ICD-10-CM | POA: Diagnosis not present

## 2019-05-28 DIAGNOSIS — R1312 Dysphagia, oropharyngeal phase: Secondary | ICD-10-CM | POA: Diagnosis not present

## 2019-05-28 DIAGNOSIS — Z9181 History of falling: Secondary | ICD-10-CM | POA: Diagnosis not present

## 2019-05-28 DIAGNOSIS — M810 Age-related osteoporosis without current pathological fracture: Secondary | ICD-10-CM | POA: Diagnosis not present

## 2019-05-29 DIAGNOSIS — I69398 Other sequelae of cerebral infarction: Secondary | ICD-10-CM | POA: Diagnosis not present

## 2019-05-29 DIAGNOSIS — R1312 Dysphagia, oropharyngeal phase: Secondary | ICD-10-CM | POA: Diagnosis not present

## 2019-05-29 DIAGNOSIS — Z9181 History of falling: Secondary | ICD-10-CM | POA: Diagnosis not present

## 2019-05-29 DIAGNOSIS — M810 Age-related osteoporosis without current pathological fracture: Secondary | ICD-10-CM | POA: Diagnosis not present

## 2019-05-29 DIAGNOSIS — M6281 Muscle weakness (generalized): Secondary | ICD-10-CM | POA: Diagnosis not present

## 2019-05-30 DIAGNOSIS — Z9181 History of falling: Secondary | ICD-10-CM | POA: Diagnosis not present

## 2019-05-30 DIAGNOSIS — M6281 Muscle weakness (generalized): Secondary | ICD-10-CM | POA: Diagnosis not present

## 2019-05-30 DIAGNOSIS — M810 Age-related osteoporosis without current pathological fracture: Secondary | ICD-10-CM | POA: Diagnosis not present

## 2019-05-30 DIAGNOSIS — I69398 Other sequelae of cerebral infarction: Secondary | ICD-10-CM | POA: Diagnosis not present

## 2019-05-30 DIAGNOSIS — R1312 Dysphagia, oropharyngeal phase: Secondary | ICD-10-CM | POA: Diagnosis not present

## 2019-05-31 DIAGNOSIS — I69398 Other sequelae of cerebral infarction: Secondary | ICD-10-CM | POA: Diagnosis not present

## 2019-05-31 DIAGNOSIS — Z9181 History of falling: Secondary | ICD-10-CM | POA: Diagnosis not present

## 2019-05-31 DIAGNOSIS — M6281 Muscle weakness (generalized): Secondary | ICD-10-CM | POA: Diagnosis not present

## 2019-05-31 DIAGNOSIS — M810 Age-related osteoporosis without current pathological fracture: Secondary | ICD-10-CM | POA: Diagnosis not present

## 2019-05-31 DIAGNOSIS — R1312 Dysphagia, oropharyngeal phase: Secondary | ICD-10-CM | POA: Diagnosis not present

## 2019-06-02 ENCOUNTER — Encounter: Payer: Self-pay | Admitting: Internal Medicine

## 2019-06-02 DIAGNOSIS — U071 COVID-19: Secondary | ICD-10-CM | POA: Insufficient documentation

## 2019-06-02 DIAGNOSIS — I69398 Other sequelae of cerebral infarction: Secondary | ICD-10-CM | POA: Diagnosis not present

## 2019-06-02 DIAGNOSIS — M810 Age-related osteoporosis without current pathological fracture: Secondary | ICD-10-CM | POA: Diagnosis not present

## 2019-06-02 DIAGNOSIS — M6281 Muscle weakness (generalized): Secondary | ICD-10-CM | POA: Diagnosis not present

## 2019-06-02 DIAGNOSIS — Z9181 History of falling: Secondary | ICD-10-CM | POA: Diagnosis not present

## 2019-06-02 DIAGNOSIS — R1312 Dysphagia, oropharyngeal phase: Secondary | ICD-10-CM | POA: Diagnosis not present

## 2019-06-02 NOTE — Progress Notes (Signed)
Location:  [facility]   Place of Service:   SNF  Melissa Jarvis, [name], MD  Patient Care Team: Melissa Jarvis, Melissa Brockel D, MD as PCP - General (Internal Medicine)  Extended Emergency Contact Information Primary Emergency Contact: Melissa Jarvis,Melissa Jarvis Address: [address]          [place], [place] [address] [place] Home Phone: [phone] Relation: Daughter Secondary Emergency Contact: Melissa Jarvis, Melissa Jarvis Mobile Phone: [phone] Relation: Daughter    Allergies: Penicillins  Chief Complaint  Patient presents with  . Acute Visit    HPI: Patient is 78 y.o. female who is being seen because [facility].  Patient is asymptomatic, meaning she has no cough fever chills nausea vomiting diarrhea chest pain shortness of breath problems with smell or taste sinus symptoms sore throat or any other symptoms specific to COVID.  At baseline patient has severe dementia.  Past Medical History:  Diagnosis Date  . Alzheimer's disease (HCC)   . Chronic constipation   . Depressed   . Failure to thrive in adult   . HCAP (healthcare-associated pneumonia)   . Hyperlipidemia   . Hypernatremia   . Hypertension   . Hypokalemia   . Low back pain   . Migraines   . Osteoarthritis   . Osteoporosis   . PBA (pseudobulbar affect)   . Post-ictal state (HCC)   . Seizures (HCC)   . Urinary tract infection with hematuria   . Vitamin B12 deficiency     Past Surgical History:  Procedure Laterality Date  . ABDOMINAL HYSTERECTOMY    . CHOLECYSTECTOMY    . KNEE SURGERY     total    Allergies as of 05/24/2019      Reactions   Penicillins Other (See Comments)   Tolerated Ceftriaxone 04/2018 Other reaction(s): UNKNOWN REACTION      Medication List       Accurate as of May 24, 2019 11:59 PM. If you have any questions, ask your nurse or doctor.        acetaminophen 325 MG tablet Commonly known as: TYLENOL Take  650 mg by mouth every 6 (six) hours as needed for mild pain.   amLODipine 10 MG tablet Commonly known as: NORVASC Take 10 mg by mouth daily.   aspirin EC 81 MG EC tablet Generic drug: aspirin Take 81 mg by mouth daily. Swallow whole.   bisacodyl 10 MG suppository Commonly known as: DULCOLAX Place 10 mg rectally daily as needed for moderate constipation (if not relieved by MOM).   calcium carbonate 1250 (500 Ca) MG chewable tablet Commonly known as: OS-CAL Chew 1 tablet by mouth daily.   cloNIDine 0.2 mg/24hr patch Commonly known as: CATAPRES - Dosed in mg/24 hr Place 0.2 mg onto the skin once a week.   D3-1000 25 MCG (1000 UT) tablet Generic drug: Cholecalciferol Take 1,000 Units by mouth daily.   FISH OIL PO Take 5 mLs by mouth daily. 1,600 MG/5 ML LIQUID- take 5 ml po daily as supplement   levETIRAcetam 100 MG/ML solution Commonly known as: KEPPRA Take 5 mLs (500 mg total) by mouth 2 (two) times daily.   MILK OF MAGNESIA PO Take 30 mLs by mouth daily as needed (if no BM in 3 days).   Namenda XR 28 MG Cp24 24 hr capsule Generic drug: memantine Take 28 mg by mouth at bedtime.   Nuedexta 20-10 MG capsule Generic drug: Dextromethorphan-quiNIDine Take 1  capsule by mouth daily.   NUTRITIONAL SUPPLEMENT PO Take 1 each by mouth 3 (three) times daily. Magic Cup   feeding supplement (BOOST BREEZE) Liqd Take 237 mLs by mouth 2 (two) times daily.   pravastatin 40 MG tablet Commonly known as: PRAVACHOL Take 40 mg by mouth daily.   RA SALINE ENEMA RE If not relieved by Biscodyl suppository, give disposable Saline Enema rectally X 1 dose/24 hrs as needed (Do not use constipation standing orders for residents with renal failure/CFR less than 30. Contact MD for orders)(Physician Or   rivastigmine 6 MG capsule Commonly known as: EXELON Take 6 mg by mouth 2 (two) times daily.   vitamin B-12 1000 MCG tablet Commonly known as: CYANOCOBALAMIN Take 1,000 mcg by mouth daily.    vitamin C 1000 MG tablet Take 1,000 mg by mouth daily.   Zinc Sulfate 220 (50 Zn) MG Tabs Take 1 tablet by mouth.       No orders of the defined types were placed in this encounter.   Immunization History  Administered Date(s) Administered  . Influenza, High Dose Seasonal PF 08/01/2016  . Influenza-Unspecified 07/31/2017, 07/05/2018  . PPD Test 12/09/2017  . Pneumococcal Conjugate-13 08/01/2016  . Tdap 12/16/2014    Social History   Tobacco Use  . Smoking status: Former Research scientist (life sciences)  . Smokeless tobacco: Never Used  Substance Use Topics  . Alcohol use: No    Review of Systems    unable to obtain secondary to dementia; nursing- no acute concerns   Vitals:   06/02/19 1231  BP: 140/78  Pulse: 86  Resp: 18  Temp: 97.8 F (36.6 C)   Body mass index is 23.05 kg/m. Physical Exam  GENERAL APPEARANCE: Alert, non-conversant, No acute distress  SKIN: No diaphoresis rash HEENT: Unremarkable RESPIRATORY: Breathing is even, unlabored. Lung sounds are clear   CARDIOVASCULAR: Heart RRR 3/6 systolic murmur, no, rubs or gallops. No peripheral edema  GASTROINTESTINAL: Abdomen is soft, non-tender, not distended w/ normal bowel sounds.  GENITOURINARY: Bladder non tender, not distended  MUSCULOSKELETAL: Very thin NEUROLOGIC: Cranial nerves 2-12 grossly intact. Moves all extremities PSYCHIATRIC: Dementia, no behavioral issues  Patient Active Problem List   Diagnosis Date Noted  . Malnutrition of moderate degree 12/28/2018  . Palliative care by specialist   . Goals of care, counseling/discussion   . Emesis 12/27/2018  . Enterococcus UTI 04/29/2018  . CAP (community acquired pneumonia) 04/25/2018  . Multifocal pneumonia 04/22/2018  . Post-ictal state ([place]) 02/18/2018  . Urinary tract infection with hematuria   . Seizure ([place]) 02/11/2018  . Chronic constipation 02/09/2018  . Dementia ([place]) 12/16/2017  . Failure to thrive in adult 12/16/2017  . Hypernatremia 12/16/2017  .  Hypokalemia 12/16/2017  . Hypertension 12/16/2017  . 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 Hyperlipidemia 12/16/2017  . Osteoporosis 12/16/2017  . Vitamin B12 deficiency 12/16/2017  . Depression 12/16/2017    CMP     Component Value Date/Time   NA 144 02/25/2019   K 4.8 02/25/2019   CL 111 12/29/2018 0554   CO2 27 12/29/2018 0554   GLUCOSE 100 (H) 12/29/2018 0554   BUN 8 02/25/2019   CREATININE 0.4 (A) 02/25/2019   CREATININE 0.42 (L) 12/29/2018 0554   CALCIUM 9.4 12/29/2018 0554   PROT 6.9 12/26/2018 2332   ALBUMIN 3.5 12/26/2018 2332   AST 23 02/21/2019   ALT 30 02/21/2019   ALKPHOS 101 02/21/2019   BILITOT 0.5 12/26/2018 2332   GFRNONAA >60 12/29/2018 0554   GFRAA >60  12/29/2018 0554   Recent Labs    12/26/18 2332 12/28/18 0549 12/29/18 0554 02/21/19 02/22/19 02/25/19  NA 146* 147* 147* 145 145 144  K 3.6 3.5 3.2* 4.1 4.1 4.8  CL 109 115* 111  --   --   --   CO2 27 27 27   --   --   --   GLUCOSE 122* 92 100*  --   --   --   BUN 22 13 6* 8 8 8   CREATININE 0.74 0.55 0.42* 0.4* 0.4* 0.4*  CALCIUM 9.5 8.8* 9.4  --   --   --    Recent Labs    12/26/18 2332 02/21/19  AST 20 23  ALT 12 30  ALKPHOS 78 101  BILITOT 0.5  --   PROT 6.9  --   ALBUMIN 3.5  --    Recent Labs    07/22/18 12/26/18 2332 12/28/18 0549 02/21/19  WBC 7.2 4.3 8.4 6.5  NEUTROABS 6  --   --   --   HGB 15.4 14.3 11.5* 13.8  HCT 46 45.5 35.8* 40  MCV  --  98.5 97.3  --   PLT 120* 133* 102* 143*   Recent Labs    02/21/19  CHOL 130  LDLCALC 68  TRIG 82   No results found for: [facility] Lab Results  Component Value Date   TSH 1.30 02/21/2019   Lab Results  Component Value Date   HGBA1C 5.5 02/21/2019   Lab Results  Component Value Date   CHOL 130 02/21/2019   HDL 45 02/21/2019   LDLCALC 68 02/21/2019   TRIG 82 02/21/2019   CHOLHDL 3.1 02/12/2018    Significant Diagnostic Results in last 30 days:  No results found.  Assessment and Plan  COVID-19 positive- patient  at this point is asymptomatic; patient has been started on the "cobra cocktail" which is vitamin C at thousand units daily for 14 days, vitamin Jarvis 50,000 units weekly for 14 days, zinc daily and ASA 81 mg daily.  Patient is in the COVID unit where she will be monitored very closely.  Full PPE is required for any visit.     Melissa HanksAnne Jarvis Jeriel Vivanco, MD

## 2019-06-03 DIAGNOSIS — R1312 Dysphagia, oropharyngeal phase: Secondary | ICD-10-CM | POA: Diagnosis not present

## 2019-06-03 DIAGNOSIS — Z9181 History of falling: Secondary | ICD-10-CM | POA: Diagnosis not present

## 2019-06-03 DIAGNOSIS — M810 Age-related osteoporosis without current pathological fracture: Secondary | ICD-10-CM | POA: Diagnosis not present

## 2019-06-03 DIAGNOSIS — M6281 Muscle weakness (generalized): Secondary | ICD-10-CM | POA: Diagnosis not present

## 2019-06-03 DIAGNOSIS — I69398 Other sequelae of cerebral infarction: Secondary | ICD-10-CM | POA: Diagnosis not present

## 2019-06-04 DIAGNOSIS — M6281 Muscle weakness (generalized): Secondary | ICD-10-CM | POA: Diagnosis not present

## 2019-06-04 DIAGNOSIS — Z9181 History of falling: Secondary | ICD-10-CM | POA: Diagnosis not present

## 2019-06-04 DIAGNOSIS — R1312 Dysphagia, oropharyngeal phase: Secondary | ICD-10-CM | POA: Diagnosis not present

## 2019-06-04 DIAGNOSIS — M810 Age-related osteoporosis without current pathological fracture: Secondary | ICD-10-CM | POA: Diagnosis not present

## 2019-06-04 DIAGNOSIS — I69398 Other sequelae of cerebral infarction: Secondary | ICD-10-CM | POA: Diagnosis not present

## 2019-06-05 DIAGNOSIS — M810 Age-related osteoporosis without current pathological fracture: Secondary | ICD-10-CM | POA: Diagnosis not present

## 2019-06-05 DIAGNOSIS — Z9181 History of falling: Secondary | ICD-10-CM | POA: Diagnosis not present

## 2019-06-05 DIAGNOSIS — R1312 Dysphagia, oropharyngeal phase: Secondary | ICD-10-CM | POA: Diagnosis not present

## 2019-06-05 DIAGNOSIS — I69398 Other sequelae of cerebral infarction: Secondary | ICD-10-CM | POA: Diagnosis not present

## 2019-06-05 DIAGNOSIS — M6281 Muscle weakness (generalized): Secondary | ICD-10-CM | POA: Diagnosis not present

## 2019-06-06 DIAGNOSIS — M810 Age-related osteoporosis without current pathological fracture: Secondary | ICD-10-CM | POA: Diagnosis not present

## 2019-06-06 DIAGNOSIS — I69398 Other sequelae of cerebral infarction: Secondary | ICD-10-CM | POA: Diagnosis not present

## 2019-06-06 DIAGNOSIS — R1312 Dysphagia, oropharyngeal phase: Secondary | ICD-10-CM | POA: Diagnosis not present

## 2019-06-06 DIAGNOSIS — Z9181 History of falling: Secondary | ICD-10-CM | POA: Diagnosis not present

## 2019-06-06 DIAGNOSIS — M6281 Muscle weakness (generalized): Secondary | ICD-10-CM | POA: Diagnosis not present

## 2019-06-07 DIAGNOSIS — R1312 Dysphagia, oropharyngeal phase: Secondary | ICD-10-CM | POA: Diagnosis not present

## 2019-06-07 DIAGNOSIS — Z9181 History of falling: Secondary | ICD-10-CM | POA: Diagnosis not present

## 2019-06-07 DIAGNOSIS — M6281 Muscle weakness (generalized): Secondary | ICD-10-CM | POA: Diagnosis not present

## 2019-06-07 DIAGNOSIS — I69398 Other sequelae of cerebral infarction: Secondary | ICD-10-CM | POA: Diagnosis not present

## 2019-06-07 DIAGNOSIS — M810 Age-related osteoporosis without current pathological fracture: Secondary | ICD-10-CM | POA: Diagnosis not present

## 2019-06-08 DIAGNOSIS — R1312 Dysphagia, oropharyngeal phase: Secondary | ICD-10-CM | POA: Diagnosis not present

## 2019-06-08 DIAGNOSIS — M6281 Muscle weakness (generalized): Secondary | ICD-10-CM | POA: Diagnosis not present

## 2019-06-08 DIAGNOSIS — M810 Age-related osteoporosis without current pathological fracture: Secondary | ICD-10-CM | POA: Diagnosis not present

## 2019-06-08 DIAGNOSIS — Z9181 History of falling: Secondary | ICD-10-CM | POA: Diagnosis not present

## 2019-06-08 DIAGNOSIS — I69398 Other sequelae of cerebral infarction: Secondary | ICD-10-CM | POA: Diagnosis not present

## 2019-06-09 DIAGNOSIS — M6281 Muscle weakness (generalized): Secondary | ICD-10-CM | POA: Diagnosis not present

## 2019-06-09 DIAGNOSIS — R1312 Dysphagia, oropharyngeal phase: Secondary | ICD-10-CM | POA: Diagnosis not present

## 2019-06-09 DIAGNOSIS — I69398 Other sequelae of cerebral infarction: Secondary | ICD-10-CM | POA: Diagnosis not present

## 2019-06-09 DIAGNOSIS — Z9181 History of falling: Secondary | ICD-10-CM | POA: Diagnosis not present

## 2019-06-09 DIAGNOSIS — M810 Age-related osteoporosis without current pathological fracture: Secondary | ICD-10-CM | POA: Diagnosis not present

## 2019-06-10 ENCOUNTER — Other Ambulatory Visit: Payer: Self-pay | Admitting: Internal Medicine

## 2019-06-10 DIAGNOSIS — M810 Age-related osteoporosis without current pathological fracture: Secondary | ICD-10-CM | POA: Diagnosis not present

## 2019-06-10 DIAGNOSIS — Z9181 History of falling: Secondary | ICD-10-CM | POA: Diagnosis not present

## 2019-06-10 DIAGNOSIS — R1312 Dysphagia, oropharyngeal phase: Secondary | ICD-10-CM | POA: Diagnosis not present

## 2019-06-10 DIAGNOSIS — M6281 Muscle weakness (generalized): Secondary | ICD-10-CM | POA: Diagnosis not present

## 2019-06-10 DIAGNOSIS — I69398 Other sequelae of cerebral infarction: Secondary | ICD-10-CM | POA: Diagnosis not present

## 2019-06-11 DIAGNOSIS — I69398 Other sequelae of cerebral infarction: Secondary | ICD-10-CM | POA: Diagnosis not present

## 2019-06-11 DIAGNOSIS — M6281 Muscle weakness (generalized): Secondary | ICD-10-CM | POA: Diagnosis not present

## 2019-06-11 DIAGNOSIS — M810 Age-related osteoporosis without current pathological fracture: Secondary | ICD-10-CM | POA: Diagnosis not present

## 2019-06-11 DIAGNOSIS — Z9181 History of falling: Secondary | ICD-10-CM | POA: Diagnosis not present

## 2019-06-11 DIAGNOSIS — R1312 Dysphagia, oropharyngeal phase: Secondary | ICD-10-CM | POA: Diagnosis not present

## 2019-06-12 DIAGNOSIS — M810 Age-related osteoporosis without current pathological fracture: Secondary | ICD-10-CM | POA: Diagnosis not present

## 2019-06-12 DIAGNOSIS — I69398 Other sequelae of cerebral infarction: Secondary | ICD-10-CM | POA: Diagnosis not present

## 2019-06-12 DIAGNOSIS — Z9181 History of falling: Secondary | ICD-10-CM | POA: Diagnosis not present

## 2019-06-12 DIAGNOSIS — M6281 Muscle weakness (generalized): Secondary | ICD-10-CM | POA: Diagnosis not present

## 2019-06-12 DIAGNOSIS — R1312 Dysphagia, oropharyngeal phase: Secondary | ICD-10-CM | POA: Diagnosis not present

## 2019-06-13 DIAGNOSIS — M6281 Muscle weakness (generalized): Secondary | ICD-10-CM | POA: Diagnosis not present

## 2019-06-13 DIAGNOSIS — R1312 Dysphagia, oropharyngeal phase: Secondary | ICD-10-CM | POA: Diagnosis not present

## 2019-06-13 DIAGNOSIS — Z9181 History of falling: Secondary | ICD-10-CM | POA: Diagnosis not present

## 2019-06-13 DIAGNOSIS — I69398 Other sequelae of cerebral infarction: Secondary | ICD-10-CM | POA: Diagnosis not present

## 2019-06-13 DIAGNOSIS — M810 Age-related osteoporosis without current pathological fracture: Secondary | ICD-10-CM | POA: Diagnosis not present

## 2019-06-14 DIAGNOSIS — M810 Age-related osteoporosis without current pathological fracture: Secondary | ICD-10-CM | POA: Diagnosis not present

## 2019-06-14 DIAGNOSIS — Z9181 History of falling: Secondary | ICD-10-CM | POA: Diagnosis not present

## 2019-06-14 DIAGNOSIS — R1312 Dysphagia, oropharyngeal phase: Secondary | ICD-10-CM | POA: Diagnosis not present

## 2019-06-14 DIAGNOSIS — I69398 Other sequelae of cerebral infarction: Secondary | ICD-10-CM | POA: Diagnosis not present

## 2019-06-14 DIAGNOSIS — M6281 Muscle weakness (generalized): Secondary | ICD-10-CM | POA: Diagnosis not present

## 2019-06-17 DIAGNOSIS — Z9181 History of falling: Secondary | ICD-10-CM | POA: Diagnosis not present

## 2019-06-17 DIAGNOSIS — M810 Age-related osteoporosis without current pathological fracture: Secondary | ICD-10-CM | POA: Diagnosis not present

## 2019-06-17 DIAGNOSIS — M6281 Muscle weakness (generalized): Secondary | ICD-10-CM | POA: Diagnosis not present

## 2019-06-17 DIAGNOSIS — I69398 Other sequelae of cerebral infarction: Secondary | ICD-10-CM | POA: Diagnosis not present

## 2019-06-17 DIAGNOSIS — R1312 Dysphagia, oropharyngeal phase: Secondary | ICD-10-CM | POA: Diagnosis not present

## 2019-06-18 DIAGNOSIS — R1312 Dysphagia, oropharyngeal phase: Secondary | ICD-10-CM | POA: Diagnosis not present

## 2019-06-19 DIAGNOSIS — R1312 Dysphagia, oropharyngeal phase: Secondary | ICD-10-CM | POA: Diagnosis not present

## 2019-06-20 ENCOUNTER — Encounter: Payer: Self-pay | Admitting: Internal Medicine

## 2019-06-20 ENCOUNTER — Non-Acute Institutional Stay (SKILLED_NURSING_FACILITY): Payer: Medicare Other | Admitting: Internal Medicine

## 2019-06-20 DIAGNOSIS — F482 Pseudobulbar affect: Secondary | ICD-10-CM | POA: Diagnosis not present

## 2019-06-20 DIAGNOSIS — R569 Unspecified convulsions: Secondary | ICD-10-CM | POA: Diagnosis not present

## 2019-06-20 DIAGNOSIS — F0150 Vascular dementia without behavioral disturbance: Secondary | ICD-10-CM

## 2019-06-20 DIAGNOSIS — U071 COVID-19: Secondary | ICD-10-CM

## 2019-06-20 DIAGNOSIS — I10 Essential (primary) hypertension: Secondary | ICD-10-CM | POA: Diagnosis not present

## 2019-06-20 DIAGNOSIS — R627 Adult failure to thrive: Secondary | ICD-10-CM | POA: Diagnosis not present

## 2019-06-20 DIAGNOSIS — Z0000 Encounter for general adult medical examination without abnormal findings: Secondary | ICD-10-CM

## 2019-06-20 DIAGNOSIS — R1312 Dysphagia, oropharyngeal phase: Secondary | ICD-10-CM | POA: Diagnosis not present

## 2019-06-20 NOTE — Progress Notes (Signed)
This encounter was created in error - please disregard.

## 2019-06-20 NOTE — Progress Notes (Deleted)
Subjective:   Melissa Jarvis is a 78 y.o. female who presents for Medicare Annual (Subsequent) preventive examination.  Review of Systems:  ***       Objective:     Vitals: BP 123/74   Pulse 89   Temp (!) 97.5 F (36.4 C) (Oral)   Resp 18   Ht 5\' 6"  (1.676 m)   Wt 147 lb 3.2 oz (66.8 kg)   BMI 23.76 kg/m   Body mass index is 23.76 kg/m.  Advanced Directives 03/19/2019 02/13/2019 12/27/2018 04/24/2018 04/23/2018 03/06/2018 02/15/2018  Does Patient Have a Medical Advance Directive? Yes Yes No - No No No  Type of Advance Directive (No Data) Out of facility DNR (pink MOST or yellow form) - - - - -  Does patient want to make changes to medical advance directive? No - Patient declined No - Patient declined - - - - -  Would patient like information on creating a medical advance directive? - - - No - Patient declined - No - Patient declined No - Patient declined  Pre-existing out of facility DNR order (yellow form or pink MOST form) - Yellow form placed in chart (order not valid for inpatient use) - - - - -    Tobacco Social History   Tobacco Use  Smoking Status Former Smoker  Smokeless Tobacco Never Used     Counseling given: Not Answered   Clinical Intake:                       Past Medical History:  Diagnosis Date  . Alzheimer's disease (HCC)   . Chronic constipation   . Depressed   . Failure to thrive in adult   . HCAP (healthcare-associated pneumonia)   . Hyperlipidemia   . Hypernatremia   . Hypertension   . Hypokalemia   . Low back pain   . Migraines   . Osteoarthritis   . Osteoporosis   . PBA (pseudobulbar affect)   . Post-ictal state (HCC)   . Seizures (HCC)   . Urinary tract infection with hematuria   . Vitamin B12 deficiency    Past Surgical History:  Procedure Laterality Date  . ABDOMINAL HYSTERECTOMY    . CHOLECYSTECTOMY    . KNEE SURGERY     total   Family History  Problem Relation Age of Onset  . Hypertension Mother   .  Peripheral vascular disease Mother        Bilateral leg amputations  . Alzheimer's disease Father   . Stroke Brother   . Heart attack Brother    Social History   Socioeconomic History  . Marital status: Widowed    Spouse name: Not on file  . Number of children: Not on file  . Years of education: Not on file  . Highest education level: Not on file  Occupational History  . Not on file  Social Needs  . Financial resource strain: Not on file  . Food insecurity    Worry: Not on file    Inability: Not on file  . Transportation needs    Medical: Not on file    Non-medical: Not on file  Tobacco Use  . Smoking status: Former Games developer  . Smokeless tobacco: Never Used  Substance and Sexual Activity  . Alcohol use: No  . Drug use: No  . Sexual activity: Not on file  Lifestyle  . Physical activity    Days per week: Not on file  Minutes per session: Not on file  . Stress: Not on file  Relationships  . Social Musicianconnections    Talks on phone: Not on file    Gets together: Not on file    Attends religious service: Not on file    Active member of club or organization: Not on file    Attends meetings of clubs or organizations: Not on file    Relationship status: Not on file  Other Topics Concern  . Not on file  Social History Narrative          Outpatient Encounter Medications as of 06/20/2019  Medication Sig  . acetaminophen (TYLENOL) 325 MG tablet Take 650 mg by mouth every 6 (six) hours as needed for mild pain.   Marland Kitchen. amLODipine (NORVASC) 10 MG tablet Take 10 mg by mouth daily.  Marland Kitchen. aspirin (ASPIRIN EC) 81 MG EC tablet Take 81 mg by mouth daily. Swallow whole.   . bisacodyl (DULCOLAX) 10 MG suppository Place 10 mg rectally daily as needed for moderate constipation (if not relieved by MOM).   . calcium carbonate (OS-CAL) 1250 (500 Ca) MG chewable tablet Chew 1 tablet by mouth daily.  . Cholecalciferol (D3-1000) 1000 units tablet Take 1,000 Units by mouth daily.  . cloNIDine (CATAPRES  - DOSED IN MG/24 HR) 0.3 mg/24hr patch Place 0.3 mg onto the skin once a week.  Marland Kitchen. Dextromethorphan-quiNIDine (NUEDEXTA) 20-10 MG CAPS Take 1 capsule by mouth daily.   Marland Kitchen. levETIRAcetam (KEPPRA) 100 MG/ML solution Take 5 mLs (500 mg total) by mouth 2 (two) times daily.  . Magnesium Hydroxide (MILK OF MAGNESIA PO) Take 30 mLs by mouth daily as needed (if no BM in 3 days).   . memantine (NAMENDA XR) 28 MG CP24 24 hr capsule Take 28 mg by mouth at bedtime.   . Nutritional Supplements (FEEDING SUPPLEMENT, BOOST BREEZE,) LIQD Take 237 mLs by mouth 2 (two) times daily.  . Nutritional Supplements (NUTRITIONAL SUPPLEMENT PO) Take 1 each by mouth 3 (three) times daily. Magic Cup  . Omega-3 Fatty Acids (FISH OIL PO) Take 5 mLs by mouth daily. 1,600 MG/5 ML LIQUID- take 5 ml po daily as supplement   . pravastatin (PRAVACHOL) 40 MG tablet Take 40 mg by mouth daily.  . rivastigmine (EXELON) 6 MG capsule Take 6 mg by mouth 2 (two) times daily.   . Sodium Phosphates (RA SALINE ENEMA RE) If not relieved by Biscodyl suppository, give disposable Saline Enema rectally X 1 dose/24 hrs as needed (Do not use constipation standing orders for residents with renal failure/CFR less than 30. Contact MD for orders)(Physician Or  . vitamin B-12 (CYANOCOBALAMIN) 1000 MCG tablet Take 1,000 mcg by mouth daily.  . [DISCONTINUED] cloNIDine (CATAPRES - DOSED IN MG/24 HR) 0.2 mg/24hr patch Place 0.2 mg onto the skin once a week.    No facility-administered encounter medications on file as of 06/20/2019.     Activities of Daily Living In your present state of health, do you have any difficulty performing the following activities: 12/27/2018  Hearing? Y  Vision? Y  Difficulty concentrating or making decisions? Y  Walking or climbing stairs? N  Dressing or bathing? Y  Doing errands, shopping? Y  Some recent data might be hidden    Patient Care Team: [name], Anne D, MD as PCP - General (Internal Medicine)    Assessment:   This  is a routine wellness examination for Melissa Jarvis.  Exercise Activities and Dietary recommendations    Goals   None  Fall Risk Fall Risk  03/06/2018  Falls in the past year? No   Is the patient's home free of loose throw rugs in walkways, pet beds, electrical cords, etc?   {Blank single:19197::"yes","no"}      Grab bars in the bathroom? {Blank single:19197::"yes","no"}      Handrails on the stairs?   {Blank single:19197::"yes","no"}      Adequate lighting?   {Blank single:19197::"yes","no"}  Timed Get Up and Go performed: ***  Depression Screen PHQ 2/9 Scores 03/06/2018  Exception Documentation Medical reason     Cognitive Function MMSE - Mini Mental State Exam 03/06/2018  Not completed: Unable to complete        Immunization History  Administered Date(s) Administered  . Influenza, High Dose Seasonal PF 08/01/2016  . Influenza-Unspecified 07/31/2017, 07/05/2018  . PPD Test 12/09/2017  . Pneumococcal Conjugate-13 08/01/2016  . Tdap 12/16/2014    Qualifies for Shingles Vaccine?***  Screening Tests Health Maintenance  Topic Date Due  . PNA vac Low Risk Adult (2 of 2 - PPSV23) 08/01/2017  . INFLUENZA VACCINE  05/18/2019  . TETANUS/TDAP  12/15/2024  . DEXA SCAN  Discontinued    Cancer Screenings: Lung: Low Dose CT Chest recommended if Age 61-80 years, 30 pack-year currently smoking OR have quit w/in 15years. Patient {DOES NOT does:27190::"does not"} qualify. Breast:  Up to date on Mammogram? {Yes/No:30480221}   Up to date of Bone Density/Dexa? {Yes/No:30480221} Colorectal: ***  Additional Screenings: ***: Hepatitis C Screening:      Plan:   ***   I have personally reviewed and noted the following in the patient's chart:   . Medical and social history . Use of alcohol, tobacco or illicit drugs  . Current medications and supplements . Functional ability and status . Nutritional status . Physical activity . Advanced directives . List of other physicians  . Hospitalizations, surgeries, and ER visits in previous 12 months . Vitals . Screenings to include cognitive, depression, and falls . Referrals and appointments  In addition, I have reviewed and discussed with patient certain preventive protocols, quality metrics, and best practice recommendations. A written personalized care plan for preventive services as well as general preventive health recommendations were provided to patient.     Granville Lewis, PA-C  06/20/2019

## 2019-06-20 NOTE — Progress Notes (Addendum)
Location:  [facility] Room Number: 203-D Place of Service:  SNF [phone]) Provider:  Edmon Crape, PA-C  Patient Care Team: [name], MD as PCP - General (Internal Medicine)  Extended Emergency Contact Information Primary Emergency Contact: [name],[name] Address: [address]          [place], [place] [address] [place] Home Phone: [phone] Relation: Daughter Secondary Emergency Contact: [name] Mobile Phone: [phone] Relation: Daughter  Code Status:  Full Code Goals of care: Advanced Directive information Advanced Directives 03/19/2019  Does Patient Have a Medical Advance Directive? Yes  Type of Advance Directive (No Data)  Does patient want to make changes to medical advance directive? No - Patient declined  Would patient like information on creating a medical advance directive? -  Pre-existing out of facility DNR order (yellow form or pink MOST form) -     Chief Complaint  Patient presents with  . Medical Management of Chronic Issues    Routine [facility] visit  Medical management of chronic medical conditions including hypertension-dementia-hyperlipidemia-osteoporosis-seizure disorder as well as failure to thrive and pseudobulbar affect  HPI:  Pt is a 78 y.o. female seen today for medical management of chronic diseases..  Nursing does not report any recent acute issues- she recently completed a stay in the isolation unit because she had a COVID-19 positive test she remained essentially asymptomatic however and did receive the COVID cocktail of vitamin C vitamin D zinc and aspirin.  She has since returned to her regular room.  She does have a history of failure to thrive most recent weight show some gain of about 4 to 5 pounds over the past month.--- Apparently she has been eating better.  She does have some weight fluctuations.  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  We have been following her blood pressure and at times still has some elevated systolics we did increase her clonidine last visit to 0.3 mg she is on a patch secondary to spotty p.o. intake of her hypertension meds.  She is also on Norvasc 10 mg a day.  Recent blood pressures 163/70-164/64-123/74- 148/82-176/78-115/72.  In regards to hyperlipidemia she is on pravastatin 40 mg a day LDL most recently was 68 that lab was done in May.  Currently she is lying in bed she appears to be comfortable she does not really respond to verbal commands she does appear to make eye contact and is alert    Past Medical History:  Diagnosis Date  . Alzheimer's disease (HCC)   . Chronic constipation   . Depressed   . Failure to thrive in adult   . HCAP (healthcare-associated pneumonia)   . Hyperlipidemia   . Hypernatremia   . Hypertension   . Hypokalemia   . Low back pain   . Migraines   . Osteoarthritis   . Osteoporosis   . PBA (pseudobulbar affect)   . Post-ictal state (HCC)   . Seizures (HCC)   . Urinary tract infection with hematuria   . Vitamin B12 deficiency    Past Surgical History:  Procedure Laterality Date  . ABDOMINAL HYSTERECTOMY    . CHOLECYSTECTOMY    . KNEE SURGERY     total    Allergies  Allergen Reactions  . Penicillins Other (See Comments)    Tolerated Ceftriaxone 04/2018 Other reaction(s): UNKNOWN  REACTION      Outpatient Encounter Medications as of 06/20/2019  Medication Sig  . acetaminophen (TYLENOL) 325 MG tablet Take 650 mg by mouth every 6 (six) hours as needed for mild pain.   Marland Kitchen amLODipine (NORVASC) 10 MG tablet Take 10 mg by mouth daily.  Marland Kitchen aspirin (ASPIRIN EC) 81 MG EC tablet Take 81 mg by mouth daily. Swallow whole.   . bisacodyl (DULCOLAX) 10 MG suppository Place 10 mg rectally daily as needed for moderate constipation (if not relieved by MOM).    . calcium carbonate (OS-CAL) 1250 (500 Ca) MG chewable tablet Chew 1 tablet by mouth daily.  . Cholecalciferol (D3-1000) 1000 units tablet Take 1,000 Units by mouth daily.  . cloNIDine (CATAPRES - DOSED IN MG/24 HR) 0.3 mg/24hr patch Place 0.3 mg onto the skin once a week.  Marland Kitchen Dextromethorphan-quiNIDine (NUEDEXTA) 20-10 MG CAPS Take 1 capsule by mouth daily.   Marland Kitchen levETIRAcetam (KEPPRA) 100 MG/ML solution Take 5 mLs (500 mg total) by mouth 2 (two) times daily.  . Magnesium Hydroxide (MILK OF MAGNESIA PO) Take 30 mLs by mouth daily as needed (if no BM in 3 days).   . memantine (NAMENDA XR) 28 MG CP24 24 hr capsule Take 28 mg by mouth at bedtime.   . Nutritional Supplements (FEEDING SUPPLEMENT, BOOST BREEZE,) LIQD Take 237 mLs by mouth 2 (two) times daily.  . Nutritional Supplements (NUTRITIONAL SUPPLEMENT PO) Take 1 each by mouth 3 (three) times daily. Magic Cup  . Omega-3 Fatty Acids (FISH OIL PO) Take 5 mLs by mouth daily. 1,600 MG/5 ML LIQUID- take 5 ml po daily as supplement   . pravastatin (PRAVACHOL) 40 MG tablet Take 40 mg by mouth daily.  . rivastigmine (EXELON) 6 MG capsule Take 6 mg by mouth 2 (two) times daily.   . Sodium Phosphates (RA SALINE ENEMA RE) If not relieved by Biscodyl suppository, give disposable Saline Enema rectally X 1 dose/24 hrs as needed (Do not use constipation standing orders for residents with renal failure/CFR less than 30. Contact MD for orders)(Physician Or  . vitamin B-12 (CYANOCOBALAMIN) 1000 MCG tablet Take 1,000 mcg by mouth daily.   No facility-administered encounter medications on file as of 06/20/2019.     Review of Systems   Is unobtainable secondary to dementia please see HPI  Immunization History  Administered Date(s) Administered  . Influenza, High Dose Seasonal PF 08/01/2016  . Influenza-Unspecified 07/31/2017, 07/05/2018  . PPD Test 12/09/2017  . Pneumococcal Conjugate-13 08/01/2016  . Tdap 12/16/2014   Pertinent  Health Maintenance Due   Topic Date Due  . PNA vac Low Risk Adult (2 of 2 - PPSV23) 08/01/2017  . INFLUENZA VACCINE  05/18/2019  . DEXA SCAN  Discontinued   Fall Risk  03/06/2018  Falls in the past year? No   Functional Status Survey:    Vitals:   06/20/19 1603  BP: 123/74  Pulse: 89  Resp: 18  Temp: (!) 97.5 F (36.4 C)  TempSrc: Oral  Weight: 147 lb 3.2 oz (66.8 kg)  Height: 5\' 6"  (1.676 m)  Body mass index is 23.76 kg/m. Physical Exam  blood pressures are variable as noted above with at times systolics in the 213Y  In general this is an elderly female in no distress lying comfortably in bed.  Her skin is warm and dry.  Eyes visual acuity appears to be grossly intact her sclera and conjunctive are remain clear.  Oropharynx continues to be somewhat difficult to assess since patient does  not really follow verbal commands and open her mouth.  Chest is clear to auscultation with poor respiratory effort there is no labored breathing.  Heart is regular rate and rhythm without murmur gallop or rub she does not have significant edema.  Abdomen is soft nontender with positive bowel sounds.  Musculoskeletal does hold her right leg in a slightly contracted position limited exam because she is not following verbal commands does appear able to move her other extremities at baseline.  Neurologic she appears alert cranial nerves appear to be intact she does not really speak.  Psych findings consistent with dimension with no verbalizing.         Labs reviewed:  May 21, 2019.  Sodium 145 potassium 3.9 BUN 7.8 creatinine 0.36.  B12 level was 422.  Vitamin D 37.17   Recent Labs    12/26/18 2332 12/28/18 0549 12/29/18 0554 02/21/19 02/22/19 02/25/19  NA 146* 147* 147* 145 145 144  K 3.6 3.5 3.2* 4.1 4.1 4.8  CL 109 115* 111  --   --   --   CO2 27 27 27   --   --   --   GLUCOSE 122* 92 100*  --   --   --   BUN 22 13 6* 8 8 8   CREATININE 0.74 0.55 0.42* 0.4* 0.4* 0.4*  CALCIUM 9.5  8.8* 9.4  --   --   --    Recent Labs    12/26/18 2332 02/21/19  AST 20 23  ALT 12 30  ALKPHOS 78 101  BILITOT 0.5  --   PROT 6.9  --   ALBUMIN 3.5  --    Recent Labs    07/22/18 12/26/18 2332 12/28/18 0549 02/21/19  WBC 7.2 4.3 8.4 6.5  NEUTROABS 6  --   --   --   HGB 15.4 14.3 11.5* 13.8  HCT 46 45.5 35.8* 40  MCV  --  98.5 97.3  --   PLT 120* 133* 102* 143*   Lab Results  Component Value Date   TSH 1.30 02/21/2019   Lab Results  Component Value Date   HGBA1C 5.5 02/21/2019   Lab Results  Component Value Date   CHOL 130 02/21/2019   HDL 45 02/21/2019   LDLCALC 68 02/21/2019   TRIG 82 02/21/2019   CHOLHDL 3.1 02/12/2018    Significant Diagnostic Results in last 30 days:  No results found.  Assessment/Plan  #1 hypertension- as noted above she does have frequent systolics that are somewhat elevated-but then at times there is normalization as well she is on clonidine patch 0.3 mg change q. weekly as well as Norvasc 10 mg a day-will add Cozaar 25 mg a day and monitor-this was discussed with Dr. Lyn HollingsheadAlexander.  2.  History of dementia she is on Exelon 6 mg twice daily Namenda 20 mg a day and Nuedexta for pseudobulbar lower affect- nursing is not really noted any recent behavioral issues at this point will monitor.  3.  Failure to thrive she does have a history of spotty p.o. intake but apparently she is doing a bit better now and appears to have gained a few pounds.  She does continue on boost supplementation as well as Magic cup At this point will monitor continue supportive care.  She does have a history of times of borderline hypernatremia and will order an metabolic panel as well.  4.  History of osteoporosis she continues on calcium with vitamin D vitamin D level was 37.17  on lab done last month which is an improvement from 22.9 back in May.  5.  History of hyperlipidemia continues on pravastatin 40 mg a day LDL was 68 back in May I do note she is also on fish  oil.  6.  History of seizures she continues on Keppra 500 mg twice daily apparently this has been stable  #7 history of mild thrombocytopenia with a platelet count of 113,000 on lab done in April will have this rechecked she does not show evidence of increased bruising or bleeding.  #8 history of COVID-19 positive test- she did go to the isolation unit remained asymptomatic she is back in her room and appears to be stable in this regards  [id number]   ADDENDUM--I subsequently did discuss patient's blood pressures with nursing and they state that she has limited  intake at times of her  P.o.  medications and that she often ends up not taking her Norvasc- at this point will encourage continuing Norvasc and hopefully she will take this more often this could explain why at times her blood pressures are elevated and at times appear to be normal.  Again this is challenging with patient's significant dementia and at times difficulty administering p.o. I antihypertensive medication-- and this is the reason she is on a patch with clonidine as well.

## 2019-06-21 ENCOUNTER — Encounter: Payer: Self-pay | Admitting: Internal Medicine

## 2019-06-21 DIAGNOSIS — Z79899 Other long term (current) drug therapy: Secondary | ICD-10-CM | POA: Diagnosis not present

## 2019-06-21 DIAGNOSIS — D649 Anemia, unspecified: Secondary | ICD-10-CM | POA: Diagnosis not present

## 2019-06-21 DIAGNOSIS — R1312 Dysphagia, oropharyngeal phase: Secondary | ICD-10-CM | POA: Diagnosis not present

## 2019-06-21 DIAGNOSIS — I10 Essential (primary) hypertension: Secondary | ICD-10-CM | POA: Diagnosis not present

## 2019-06-21 LAB — CBC AND DIFFERENTIAL
HCT: 42 (ref 36–46)
Hemoglobin: 14 (ref 12.0–16.0)
Platelets: 173 (ref 150–399)
WBC: 4.1

## 2019-06-21 LAB — BASIC METABOLIC PANEL
BUN: 7 (ref 4–21)
CO2: 25 — AB (ref 13–22)
Chloride: 105 (ref 99–108)
Creatinine: 0.4 — AB (ref 0.5–1.1)
Glucose: 72
Potassium: 4.4 (ref 3.4–5.3)
Sodium: 146 (ref 137–147)

## 2019-06-21 LAB — COMPREHENSIVE METABOLIC PANEL
Calcium: 9.3 (ref 8.7–10.7)
GFR calc Af Amer: 90
GFR calc non Af Amer: 90

## 2019-06-21 LAB — CBC: RBC: 4.39 (ref 3.87–5.11)

## 2019-06-28 DIAGNOSIS — I10 Essential (primary) hypertension: Secondary | ICD-10-CM | POA: Diagnosis not present

## 2019-06-28 DIAGNOSIS — D649 Anemia, unspecified: Secondary | ICD-10-CM | POA: Diagnosis not present

## 2019-06-28 LAB — COMPREHENSIVE METABOLIC PANEL: Calcium: 8.9 (ref 8.7–10.7)

## 2019-06-28 LAB — BASIC METABOLIC PANEL: Glucose: 98

## 2019-06-28 NOTE — Progress Notes (Signed)
This encounter was created in error - please disregard.

## 2019-07-22 ENCOUNTER — Encounter: Payer: Self-pay | Admitting: Internal Medicine

## 2019-07-22 ENCOUNTER — Non-Acute Institutional Stay (SKILLED_NURSING_FACILITY): Payer: Medicare Other | Admitting: Internal Medicine

## 2019-07-22 DIAGNOSIS — I10 Essential (primary) hypertension: Secondary | ICD-10-CM

## 2019-07-22 NOTE — Progress Notes (Signed)
Location:  [facility] Room Number: 203-D Place of Service:  SNF ([id number])  [name], MD  Patient Care Team: [name], MD as PCP - General (Internal Medicine)  Extended Emergency Contact Information Primary Emergency Contact: [name],[name] Address: [address]          [place], [place] [address] [place] Home Phone: [phone] Relation: Daughter Secondary Emergency Contact: [name] Mobile Phone: [phone] Relation: Daughter    Allergies: Penicillins  Chief Complaint  Patient presents with  . Acute Visit    Patient is seen for elevated blood pressure.    HPI: Patient is a 78 y.o. female who is being seen for elevated blood pressure.  Patient has no apparent symptoms or signs with blood pressure elevation.  Past Medical History:  Diagnosis Date  . Alzheimer's disease (HCC)   . Chronic constipation   . Depressed   . Failure to thrive in adult   . HCAP (healthcare-associated pneumonia)   . Hyperlipidemia   . Hypernatremia   . Hypertension   . Hypokalemia   . Low back pain   . Migraines   . Osteoarthritis   . Osteoporosis   . PBA (pseudobulbar affect)   . Post-ictal state (HCC)   . Seizures (HCC)   . Urinary tract infection with hematuria   . Vitamin B12 deficiency     Past Surgical History:  Procedure Laterality Date  . ABDOMINAL HYSTERECTOMY    . CHOLECYSTECTOMY    . KNEE SURGERY     total    Allergies as of 07/22/2019      Reactions   Penicillins Other (See Comments)   Tolerated Ceftriaxone 04/2018 Other reaction(s): UNKNOWN REACTION      Medication List       Accurate as of July 22, 2019  8:20 PM. If you have any questions, ask your nurse or doctor.        acetaminophen 325 MG tablet Commonly known as: TYLENOL Take 650 mg by mouth every 6 (six) hours as needed for mild pain.   amLODipine 10 MG tablet Commonly known as: NORVASC Take 10 mg by mouth daily.   aspirin  EC 81 MG EC tablet Generic drug: aspirin Take 81 mg by mouth daily. Swallow whole.   bisacodyl 10 MG suppository Commonly known as: DULCOLAX Place 10 mg rectally daily as needed for moderate constipation (if not relieved by MOM).   calcium carbonate 1250 (500 Ca) MG chewable tablet Commonly known as: OS-CAL Chew 1 tablet by mouth daily.   cloNIDine 0.3 mg/24hr patch Commonly known as: CATAPRES - Dosed in mg/24 hr Place 0.3 mg onto the skin once a week.   D3-1000 25 MCG (1000 UT) tablet Generic drug: Cholecalciferol Take 1,000 Units by mouth daily.   FISH OIL PO Take 5 mLs by mouth daily. 1,600 MG/5 ML LIQUID- take 5 ml po daily as supplement   levETIRAcetam 100 MG/ML solution Commonly known as: KEPPRA Take 5 mLs (500 mg total) by mouth 2 (two) times daily.   MILK OF MAGNESIA PO Take 30 mLs by mouth daily as needed (if no BM in 3 days).   Namenda XR 28 MG Cp24 24 hr capsule Generic drug: memantine Take 28 mg by mouth at bedtime.   Nuedexta 20-10 MG capsule Generic drug: Dextromethorphan-quiNIDine Take 1 capsule by mouth daily.   NUTRITIONAL SUPPLEMENT PO Take 1 each by mouth 3 (three) times daily. Magic Cup   feeding supplement (BOOST  BREEZE) Liqd Take 237 mLs by mouth 2 (two) times daily.   pravastatin 40 MG tablet Commonly known as: PRAVACHOL Take 40 mg by mouth daily.   RA SALINE ENEMA RE If not relieved by Biscodyl suppository, give disposable Saline Enema rectally X 1 dose/24 hrs as needed (Do not use constipation standing orders for residents with renal failure/CFR less than 30. Contact MD for orders)(Physician Or   rivastigmine 6 MG capsule Commonly known as: EXELON Take 6 mg by mouth 2 (two) times daily.   vitamin B-12 1000 MCG tablet Commonly known as: CYANOCOBALAMIN Take 1,000 mcg by mouth daily.       No orders of the defined types were placed in this encounter.   Immunization History  Administered Date(s) Administered  . Influenza, High  Dose Seasonal PF 08/01/2016  . Influenza-Unspecified 07/31/2017, 07/05/2018  . PPD Test 12/09/2017  . Pneumococcal Conjugate-13 08/01/2016  . Tdap 12/16/2014    Social History   Tobacco Use  . Smoking status: Former Research scientist (life sciences)  . Smokeless tobacco: Never Used  Substance Use Topics  . Alcohol use: No    Review of Systems- unable to obtain secondary to baseline status; nursing-no acute concerns    Vitals:   07/22/19 1503 07/22/19 1504  BP: (!) 187/98 (!) 177/76  Pulse: 81   Resp: 18   Temp: 98.8 F (37.1 C)    Body mass index is 24.21 kg/m. Physical Exam  GENERAL APPEARANCE: Alert, non-conversant, No acute distress  SKIN: No diaphoresis rash HEENT: Unremarkable RESPIRATORY: Breathing is even, unlabored. Lung sounds are clear   CARDIOVASCULAR: Heart RRR no murmurs, rubs or gallops. No peripheral edema  GASTROINTESTINAL: Abdomen is soft, non-tender, not distended w/ normal bowel sounds.  GENITOURINARY: Bladder non tender, not distended  MUSCULOSKELETAL: No abnormal joints or musculature NEUROLOGIC: Cranial nerves 2-12 grossly intact. Moves all extremities PSYCHIATRIC: Mood and affect , no behavioral issues  Patient Active Problem List   Diagnosis Date Noted  . Real time reverse transcriptase PCR positive for COVID-19 virus 06/02/2019  . Malnutrition of moderate degree 12/28/2018  . Palliative care by specialist   . Goals of care, counseling/discussion   . Emesis 12/27/2018  . Enterococcus UTI 04/29/2018  . CAP (community acquired pneumonia) 04/25/2018  . Multifocal pneumonia 04/22/2018  . Post-ictal state ([place]) 02/18/2018  . Urinary tract infection with hematuria   . Seizure ([place]) 02/11/2018  . Chronic constipation 02/09/2018  . Dementia ([place]) 12/16/2017  . Failure to thrive in adult 12/16/2017  . Hypernatremia 12/16/2017  . Hypokalemia 12/16/2017  . Hypertension 12/16/2017  . 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 Hyperlipidemia 12/16/2017  . Osteoporosis  12/16/2017  . Vitamin B12 deficiency 12/16/2017  . Depression 12/16/2017    CMP     Component Value Date/Time   NA 144 02/25/2019   K 4.8 02/25/2019   CL 111 12/29/2018 0554   CO2 27 12/29/2018 0554   GLUCOSE 100 (H) 12/29/2018 0554   BUN 8 02/25/2019   CREATININE 0.4 (A) 02/25/2019   CREATININE 0.42 (L) 12/29/2018 0554   CALCIUM 9.4 12/29/2018 0554   PROT 6.9 12/26/2018 2332   ALBUMIN 3.5 12/26/2018 2332   AST 23 02/21/2019   ALT 30 02/21/2019   ALKPHOS 101 02/21/2019   BILITOT 0.5 12/26/2018 2332   GFRNONAA >60 12/29/2018 0554   GFRAA >60 12/29/2018 0554   Recent Labs    12/26/18 2332 12/28/18 0549 12/29/18 0554 02/21/19 02/22/19 02/25/19  NA 146* 147* 147* 145 145 144  K 3.6 3.5  3.2* 4.1 4.1 4.8  CL 109 115* 111  --   --   --   CO2 27 27 27   --   --   --   GLUCOSE 122* 92 100*  --   --   --   BUN 22 13 6* 8 8 8   CREATININE 0.74 0.55 0.42* 0.4* 0.4* 0.4*  CALCIUM 9.5 8.8* 9.4  --   --   --    Recent Labs    12/26/18 2332 02/21/19  AST 20 23  ALT 12 30  ALKPHOS 78 101  BILITOT 0.5  --   PROT 6.9  --   ALBUMIN 3.5  --    Recent Labs    12/26/18 2332 12/28/18 0549 02/21/19  WBC 4.3 8.4 6.5  HGB 14.3 11.5* 13.8  HCT 45.5 35.8* 40  MCV 98.5 97.3  --   PLT 133* 102* 143*   Recent Labs    02/21/19  CHOL 130  LDLCALC 68  TRIG 82   No results found for: [facility] Lab Results  Component Value Date   TSH 1.30 02/21/2019   Lab Results  Component Value Date   HGBA1C 5.5 02/21/2019   Lab Results  Component Value Date   CHOL 130 02/21/2019   HDL 45 02/21/2019   LDLCALC 68 02/21/2019   TRIG 82 02/21/2019   CHOLHDL 3.1 02/12/2018    Significant Diagnostic Results in last 30 days:  No results found.  Assessment and Plan  Hypertension- patient systolic blood pressure runs from 104-190, not in any particular time of day, random.  I have looked at patient's creatinine and it is plenty low enough for an ACE/ARB.  We will start losartan 50 mg daily  and monitor response.    Margit HanksAnne D Alexander, MD

## 2019-07-26 ENCOUNTER — Encounter: Payer: Self-pay | Admitting: Internal Medicine

## 2019-07-26 ENCOUNTER — Non-Acute Institutional Stay (SKILLED_NURSING_FACILITY): Payer: Medicare Other | Admitting: Internal Medicine

## 2019-07-26 DIAGNOSIS — K5909 Other constipation: Secondary | ICD-10-CM | POA: Diagnosis not present

## 2019-07-26 DIAGNOSIS — M810 Age-related osteoporosis without current pathological fracture: Secondary | ICD-10-CM

## 2019-07-26 DIAGNOSIS — I10 Essential (primary) hypertension: Secondary | ICD-10-CM

## 2019-07-26 NOTE — Progress Notes (Signed)
Location:  [facility] Room Number: 203-D Place of Service:  SNF ([id number])  [name], MD  Patient Care Team: [name], MD as PCP - General (Internal Medicine)  Extended Emergency Contact Information Primary Emergency Contact: [name],[name] Address: [address]          [place], [place] [address] [place] Home Phone: [phone] Relation: Daughter Secondary Emergency Contact: [name] Mobile Phone: [phone] Relation: Daughter    Allergies: Penicillins  Chief Complaint  Patient presents with  . Medical Management of Chronic Issues    Routine [facility] visit    HPI: Patient is a 78 y.o. female who is being seen for routine issues of hypertension,  constipation, and osteoporosis.  Past Medical History:  Diagnosis Date  . Alzheimer's disease (HCC)   . Chronic constipation   . Depressed   . Failure to thrive in adult   . HCAP (healthcare-associated pneumonia)   . Hyperlipidemia   . Hypernatremia   . Hypertension   . Hypokalemia   . Low back pain   . Migraines   . Osteoarthritis   . Osteoporosis   . PBA (pseudobulbar affect)   . Post-ictal state (HCC)   . Seizures (HCC)   . Urinary tract infection with hematuria   . Vitamin B12 deficiency     Past Surgical History:  Procedure Laterality Date  . ABDOMINAL HYSTERECTOMY    . CHOLECYSTECTOMY    . KNEE SURGERY     total    Allergies as of 07/26/2019      Reactions   Penicillins Other (See Comments)   Tolerated Ceftriaxone 04/2018 Other reaction(s): UNKNOWN REACTION      Medication List       Accurate as of July 26, 2019 11:59 PM. If you have any questions, ask your nurse or doctor.        acetaminophen 325 MG tablet Commonly known as: TYLENOL Take 650 mg by mouth every 6 (six) hours as needed for mild pain.   amLODipine 10 MG tablet Commonly known as: NORVASC Take 10 mg by mouth daily.   aspirin EC 81 MG EC tablet  Generic drug: aspirin Take 81 mg by mouth daily. Swallow whole.   bisacodyl 10 MG suppository Commonly known as: DULCOLAX Place 10 mg rectally daily as needed for moderate constipation (if not relieved by MOM).   calcium carbonate 1250 (500 Ca) MG chewable tablet Commonly known as: OS-CAL Chew 1 tablet by mouth daily.   cloNIDine 0.3 mg/24hr patch Commonly known as: CATAPRES - Dosed in mg/24 hr Place 0.3 mg onto the skin once a week.   D3-1000 25 MCG (1000 UT) tablet Generic drug: Cholecalciferol Take 1,000 Units by mouth daily.   FISH OIL PO Take 5 mLs by mouth daily. 1,600 MG/5 ML LIQUID- take 5 ml po daily as supplement   levETIRAcetam 100 MG/ML solution Commonly known as: KEPPRA Take 5 mLs (500 mg total) by mouth 2 (two) times daily.   MILK OF MAGNESIA PO Take 30 mLs by mouth daily as needed (if no BM in 3 days).   Namenda XR 28 MG Cp24 24 hr capsule Generic drug: memantine Take 28 mg by mouth at bedtime.   Nuedexta 20-10 MG capsule Generic drug: Dextromethorphan-quiNIDine Take 1 capsule by mouth daily.   NUTRITIONAL SUPPLEMENT PO Take 1 each by mouth 3 (three) times daily. Magic Cup   feeding supplement (BOOST BREEZE) Liqd Take 237 mLs by mouth  2 (two) times daily.   pravastatin 40 MG tablet Commonly known as: PRAVACHOL Take 40 mg by mouth daily.   RA SALINE ENEMA RE If not relieved by Biscodyl suppository, give disposable Saline Enema rectally X 1 dose/24 hrs as needed (Do not use constipation standing orders for residents with renal failure/CFR less than 30. Contact MD for orders)(Physician Or   rivastigmine 6 MG capsule Commonly known as: EXELON Take 6 mg by mouth 2 (two) times daily.   vitamin B-12 1000 MCG tablet Commonly known as: CYANOCOBALAMIN Take 1,000 mcg by mouth daily.       No orders of the defined types were placed in this encounter.   Immunization History  Administered Date(s) Administered  . Influenza, High Dose Seasonal PF  08/01/2016  . Influenza-Unspecified 07/31/2017, 07/05/2018, 07/24/2019  . PPD Test 12/09/2017  . Pneumococcal Conjugate-13 08/01/2016  . Tdap 12/16/2014    Social History   Tobacco Use  . Smoking status: Former Games developermoker  . Smokeless tobacco: Never Used  Substance Use Topics  . Alcohol use: No    Review of Systems   unable to obtain secondary to patient's underlying neurologic dysfunction; nursing-no acute concerns    Vitals:   07/26/19 1111  BP: 114/77  Pulse: 87  Resp: 17  Temp: 98 F (36.7 C)   Body mass index is 24.21 kg/m. Physical Exam  GENERAL APPEARANCE: Alert, no acute distress  SKIN: No diaphoresis rash HEENT: Unremarkable RESPIRATORY: Breathing is even, unlabored. Lung sounds are clear   CARDIOVASCULAR: Heart RRR no murmurs, rubs or gallops. No peripheral edema  GASTROINTESTINAL: Abdomen is soft, non-tender, not distended w/ normal bowel sounds.  GENITOURINARY: Bladder non tender, not distended  MUSCULOSKELETAL: No abnormal joints or musculature NEUROLOGIC: Cranial nerves 2-12 grossly intact. Moves all extremities PSYCHIATRIC: Dementia, no behavioral issues  Patient Active Problem List   Diagnosis Date Noted  . Real time reverse transcriptase PCR positive for COVID-19 virus 06/02/2019  . Malnutrition of moderate degree 12/28/2018  . Palliative care by specialist   . Goals of care, counseling/discussion   . Emesis 12/27/2018  . Enterococcus UTI 04/29/2018  . CAP (community acquired pneumonia) 04/25/2018  . Multifocal pneumonia 04/22/2018  . Post-ictal state (HCC) 02/18/2018  . Urinary tract infection with hematuria   . Seizure (HCC) 02/11/2018  . Chronic constipation 02/09/2018  . Dementia (HCC) 12/16/2017  . Failure to thrive in adult 12/16/2017  . Hypernatremia 12/16/2017  . Hypokalemia 12/16/2017  . Hypertension 12/16/2017  . 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 Hyperlipidemia 12/16/2017  . Osteoporosis 12/16/2017  . Vitamin B12 deficiency  12/16/2017  . Depression 12/16/2017    CMP     Component Value Date/Time   NA 144 02/25/2019   K 4.8 02/25/2019   CL 111 12/29/2018 0554   CO2 27 12/29/2018 0554   GLUCOSE 100 (H) 12/29/2018 0554   BUN 8 02/25/2019   CREATININE 0.4 (A) 02/25/2019   CREATININE 0.42 (L) 12/29/2018 0554   CALCIUM 9.4 12/29/2018 0554   PROT 6.9 12/26/2018 2332   ALBUMIN 3.5 12/26/2018 2332   AST 23 02/21/2019   ALT 30 02/21/2019   ALKPHOS 101 02/21/2019   BILITOT 0.5 12/26/2018 2332   GFRNONAA >60 12/29/2018 0554   GFRAA >60 12/29/2018 0554   Recent Labs    12/26/18 2332 12/28/18 0549 12/29/18 0554 02/21/19 02/22/19 02/25/19  NA 146* 147* 147* 145 145 144  K 3.6 3.5 3.2* 4.1 4.1 4.8  CL 109 115* 111  --   --   --  CO2 27 27 27   --   --   --   GLUCOSE 122* 92 100*  --   --   --   BUN 22 13 6* 8 8 8   CREATININE 0.74 0.55 0.42* 0.4* 0.4* 0.4*  CALCIUM 9.5 8.8* 9.4  --   --   --    Recent Labs    12/26/18 2332 02/21/19  AST 20 23  ALT 12 30  ALKPHOS 78 101  BILITOT 0.5  --   PROT 6.9  --   ALBUMIN 3.5  --    Recent Labs    12/26/18 2332 12/28/18 0549 02/21/19  WBC 4.3 8.4 6.5  HGB 14.3 11.5* 13.8  HCT 45.5 35.8* 40  MCV 98.5 97.3  --   PLT 133* 102* 143*   Recent Labs    02/21/19  CHOL 130  LDLCALC 68  TRIG 82   No results found for: [facility] Lab Results  Component Value Date   TSH 1.30 02/21/2019   Lab Results  Component Value Date   HGBA1C 5.5 02/21/2019   Lab Results  Component Value Date   CHOL 130 02/21/2019   HDL 45 02/21/2019   LDLCALC 68 02/21/2019   TRIG 82 02/21/2019   CHOLHDL 3.1 02/12/2018    Significant Diagnostic Results in last 30 days:  No results found.  Assessment and Plan  Hypertension Not well controlled; patient recently had clonidine patch increased from 0.2 mg to 0.3 mg in addition to the Norvasc 10 which did not improve her blood pressure much; she will on the next day or so restarting losartan 50 mg daily; will monitor  response  Chronic constipation Chronic and stable; continue MiraLAX 17 g daily along with various PRN's  Osteoporosis Stable with no fractures; continue vitamin D 1000 units daily and Os-Cal 1250 mg chewables 1 p.o. daily     Hennie Duos, MD

## 2019-07-27 ENCOUNTER — Encounter: Payer: Self-pay | Admitting: Internal Medicine

## 2019-07-28 ENCOUNTER — Encounter: Payer: Self-pay | Admitting: Internal Medicine

## 2019-07-28 NOTE — Assessment & Plan Note (Signed)
Not well controlled; patient recently had clonidine patch increased from 0.2 mg to 0.3 mg in addition to the Norvasc 10 which did not improve her blood pressure much; she will on the next day or so restarting losartan 50 mg daily; will monitor response

## 2019-07-28 NOTE — Assessment & Plan Note (Signed)
Stable with no fractures; continue vitamin D 1000 units daily and Os-Cal 1250 mg chewables 1 p.o. daily

## 2019-07-28 NOTE — Assessment & Plan Note (Signed)
Chronic and stable; continue MiraLAX 17 g daily along with various PRN's

## 2019-08-12 ENCOUNTER — Non-Acute Institutional Stay (SKILLED_NURSING_FACILITY): Payer: Medicare Other | Admitting: Internal Medicine

## 2019-08-12 ENCOUNTER — Encounter: Payer: Self-pay | Admitting: Internal Medicine

## 2019-08-12 DIAGNOSIS — I10 Essential (primary) hypertension: Secondary | ICD-10-CM

## 2019-08-12 NOTE — Progress Notes (Signed)
: Provider:  Noah Delaine. Jonisha Kindig MD Location:  [facility] Room Number: 203/D Place of Service:  SNF ([phone])  PCP: Hennie Duos, MD Patient Care Team: Hennie Duos, MD as PCP - General (Internal Medicine)  Extended Emergency Contact Information Primary Emergency Contact: [name],[name] Address: [address]          [place], [place] [address] [place] Phone: [phone] Relation: Daughter Secondary Emergency Contact: Helayne Seminole Mobile Phone: [phone] Relation: Daughter     Allergies: Penicillins  Chief Complaint  Patient presents with  . Acute Visit    High Blood Pressure     HPI: Patient is 78 y.o. female who the [place] asked me to see for very high systolic blood pressure this morning of 207.  Patient was without symptoms.  Blood pressure improved when she took her morning meds.  Survey of patient's blood pressure shows systolic blood pressure as low as 104 it is high as 187.   Past Medical History:  Diagnosis Date  . Alzheimer's disease ([place])   . Chronic constipation   . Depressed   . Failure to thrive in adult   . HCAP (healthcare-associated pneumonia)   . Hyperlipidemia   . Hypernatremia   . Hypertension   . Hypokalemia   . Low back pain   . Migraines   . Osteoarthritis   . Osteoporosis   . PBA (pseudobulbar affect)   . Post-ictal state ([place])   . Seizures ([place])   . Urinary tract infection with hematuria   . Vitamin B12 deficiency     Past Surgical History:  Procedure Laterality Date  . ABDOMINAL HYSTERECTOMY    . CHOLECYSTECTOMY    . KNEE SURGERY     total    Allergies as of 08/12/2019      Reactions   Penicillins Other (See Comments)   Tolerated Ceftriaxone 04/2018 Other reaction(s): UNKNOWN REACTION      Medication List       Accurate as of August 12, 2019 10:04 PM. If you have any questions, ask your nurse or doctor.        acetaminophen 325 MG tablet Commonly known as:  TYLENOL Take 650 mg by mouth every 6 (six) hours as needed for mild pain.   amLODipine 10 MG tablet Commonly known as: NORVASC Take 10 mg by mouth daily.   aspirin EC 81 MG EC tablet Generic drug: aspirin Take 81 mg by mouth daily. Swallow whole.   bisacodyl 10 MG suppository Commonly known as: DULCOLAX Place 10 mg rectally daily as needed for moderate constipation (if not relieved by MOM).   calcium carbonate 1250 (500 Ca) MG chewable tablet Commonly known as: OS-CAL Chew 1 tablet by mouth daily.   cloNIDine 0.3 mg/24hr patch Commonly known as: CATAPRES - Dosed in mg/24 hr Place 0.3 mg onto the skin once a week.   D3-1000 25 MCG (1000 UT) tablet Generic drug: Cholecalciferol Take 1,000 Units by mouth daily.   FISH OIL PO Take 5 mLs by mouth daily. 1,600 MG/5 ML LIQUID- take 5 ml po daily as supplement   levETIRAcetam 100 MG/ML solution Commonly known as: KEPPRA Take 5 mLs (500 mg total) by mouth 2 (two) times daily.   losartan 50 MG tablet Commonly known as: COZAAR Take 100 mg by mouth daily.   MILK OF MAGNESIA PO Take 30 mLs by mouth daily as needed (if no BM in 3 days).   Namenda XR 28  MG Cp24 24 hr capsule Generic drug: memantine Take 28 mg by mouth at bedtime.   Nuedexta 20-10 MG capsule Generic drug: Dextromethorphan-quiNIDine Take 1 capsule by mouth daily.   NUTRITIONAL SUPPLEMENT PO Take 1 each by mouth 3 (three) times daily. Magic Cup   Ensure Take 237 mLs by mouth 2 (two) times daily between meals.   pravastatin 40 MG tablet Commonly known as: PRAVACHOL Take 40 mg by mouth daily.   RA SALINE ENEMA RE If not relieved by Biscodyl suppository, give disposable Saline Enema rectally X 1 dose/24 hrs as needed (Do not use constipation standing orders for residents with renal failure/CFR less than 30. Contact MD for orders)(Physician Or   rivastigmine 6 MG capsule Commonly known as: EXELON Take 6 mg by mouth 2 (two) times daily.   vitamin B-12 1000  MCG tablet Commonly known as: CYANOCOBALAMIN Take 1,000 mcg by mouth daily.       No orders of the defined types were placed in this encounter.   Immunization History  Administered Date(s) Administered  . Influenza, High Dose Seasonal PF 08/01/2016  . Influenza-Unspecified 07/31/2017, 07/05/2018, 07/24/2019  . PPD Test 12/09/2017  . Pneumococcal Conjugate-13 08/01/2016  . Tdap 12/16/2014    Social History   Tobacco Use  . Smoking status: Former Games developermoker  . Smokeless tobacco: Never Used  Substance Use Topics  . Alcohol use: No    Family history is   Family History  Problem Relation Age of Onset  . Hypertension Mother   . Peripheral vascular disease Mother        Bilateral leg amputations  . Alzheimer's disease Father   . Stroke Brother   . Heart attack Brother       Review of Systems     unable to obtain secondary to neurologic dysfunction; nursing-as per history of present illness   Vitals:   08/12/19 1647  BP: (!) 146/79  Pulse: 71  Resp: 18  Temp: 98.1 F (36.7 C)  SpO2: 97%    SpO2 Readings from Last 1 Encounters:  08/12/19 97%   Body mass index is 23.39 kg/m.     Physical Exam  GENERAL APPEARANCE: Alert, No acute distress.  SKIN: No diaphoresis rash HEAD: Normocephalic, atraumatic  EYES: Conjunctiva/lids clear. Pupils round, reactive. EOMs intact.  EARS: External exam WNL, canals clear. Hearing grossly normal.  NOSE: No deformity or discharge.  MOUTH/THROAT: Lips w/o lesions  RESPIRATORY: Breathing is even, unlabored. Lung sounds are clear   CARDIOVASCULAR: Heart RRR no murmurs, rubs or gallops. No peripheral edema.   GASTROINTESTINAL: Abdomen is soft, non-tender, not distended w/ normal bowel sounds. GENITOURINARY: Bladder non tender, not distended  MUSCULOSKELETAL: No abnormal joints or musculature NEUROLOGIC:  Cranial nerves 2-12 grossly intact. Moves all extremities  PSYCHIATRIC: Dementia, no behavioral issues  Patient Active  Problem List   Diagnosis Date Noted  . Real time reverse transcriptase PCR positive for COVID-19 virus 06/02/2019  . Malnutrition of moderate degree 12/28/2018  . Palliative care by specialist   . Goals of care, counseling/discussion   . Emesis 12/27/2018  . Enterococcus UTI 04/29/2018  . CAP (community acquired pneumonia) 04/25/2018  . Multifocal pneumonia 04/22/2018  . Post-ictal state (HCC) 02/18/2018  . Urinary tract infection with hematuria   . Seizure (HCC) 02/11/2018  . Chronic constipation 02/09/2018  . Dementia (HCC) 12/16/2017  . Failure to thrive in adult 12/16/2017  . Hypernatremia 12/16/2017  . Hypokalemia 12/16/2017  . Hypertension 12/16/2017  . 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  Hyperlipidemia 12/16/2017  . Osteoporosis 12/16/2017  . Vitamin B12 deficiency 12/16/2017  . Depression 12/16/2017      Labs reviewed: Basic Metabolic Panel:    Component Value Date/Time   NA 144 02/25/2019   K 4.8 02/25/2019   CL 111 12/29/2018 0554   CO2 27 12/29/2018 0554   GLUCOSE 100 (H) 12/29/2018 0554   BUN 8 02/25/2019   CREATININE 0.4 (A) 02/25/2019   CREATININE 0.42 (L) 12/29/2018 0554   CALCIUM 9.4 12/29/2018 0554   PROT 6.9 12/26/2018 2332   ALBUMIN 3.5 12/26/2018 2332   AST 23 02/21/2019   ALT 30 02/21/2019   ALKPHOS 101 02/21/2019   BILITOT 0.5 12/26/2018 2332   GFRNONAA >60 12/29/2018 0554   GFRAA >60 12/29/2018 0554    Recent Labs    12/26/18 2332 12/28/18 0549 12/29/18 0554 02/21/19 02/22/19 02/25/19  NA 146* 147* 147* 145 145 144  K 3.6 3.5 3.2* 4.1 4.1 4.8  CL 109 115* 111  --   --   --   CO2 --   --   --   GLUCOSE 122* 92 100*  --   --   --   BUN 22 13 6* CREATININE 0.74 0.55 0.42* 0.4* 0.4* 0.4*  CALCIUM 9.5 8.8* 9.4  --   --   --    Liver Function Tests: Recent Labs    12/26/18 2332 02/21/19  AST 20 23  ALT 12 30  ALKPHOS 78 101  BILITOT 0.5  --   PROT 6.9  --   ALBUMIN 3.5  --    Recent Labs    12/26/18  2332  LIPASE 36   No results for input(s): AMMONIA in the last 8760 hours. CBC: Recent Labs    12/26/18 2332 12/28/18 0549 02/21/19  WBC 4.3 8.4 6.5  HGB 14.3 11.5* 13.8  HCT 45.5 35.8* 40  MCV 98.5 97.3  --   PLT 133* 102* 143*   Lipid Recent Labs    02/21/19  CHOL 130  HDL 45  LDLCALC 68  TRIG 82    Cardiac Enzymes: No results for input(s): CKTOTAL, CKMB, CKMBINDEX, TROPONINI in the last 8760 hours. BNP: No results for input(s): BNP in the last 8760 hours. No results found for: [facility] Lab Results  Component Value Date   HGBA1C 5.5 02/21/2019   Lab Results  Component Value Date   TSH 1.30 02/21/2019   No results found for: VITAMINB12 No results found for: FOLATE No results found for: IRON, TIBC, FERRITIN  Imaging and Procedures obtained prior to SNF admission: Ct Abdomen Pelvis W Contrast  Result Date: 12/27/2018 CLINICAL DATA:  Failure to thrive. Projectile vomiting after eating. EXAM: CT ABDOMEN AND PELVIS WITH CONTRAST TECHNIQUE: Multidetector CT imaging of the abdomen and pelvis was performed using the standard protocol following bolus administration of intravenous contrast. CONTRAST:  OMNIPAQUE IOHEXOL 300 MG/ML  SOLN COMPARISON:  Report from 01/17/2010 CT FINDINGS: Lower chest: Normal heart size without pericardial effusion or thickening. Patchy bibasilar airspace disease consistent with multilobar pneumonia. No effusion or pneumothorax. Hepatobiliary: Tiny subcentimeter scattered hypodensities are noted within the liver compatible with cysts, biliary hamartomas or small hemangiomas. Status post cholecystectomy. No choledocholithiasis. Reservoir effect with intrahepatic and extrahepatic ductal dilatation likely on the basis of prior cholecystectomy. Pancreas: No ductal dilatation, mass or inflammation. Spleen: Normal Adrenals/Urinary Tract: Thickening of the adrenal glands. Small cystic nodule of the right adrenal gland measuring 7 mm possibly a  small  adenoma but is too small to further characterize. Cortical scarring within both kidneys subcentimeter cyst associated with the lower pole the right kidney and interpolar left kidney. No nephrolithiasis nor obstructive uropathy. The urinary bladder is decompressed. Stomach/Bowel: The stomach is physiologically distended without mucosal thickening. Normal small bowel rotation without obstruction. Significant stool retention throughout the colon consistent constipation with probable fecal impaction in the rectum. Unremarkable appendix. Vascular/Lymphatic: Moderate aortoiliac atherosclerosis without aneurysm. No lymphadenopathy. Reproductive: Status post hysterectomy. No adnexal masses. Other: No free air free fluid.  Mild soft tissue anasarca. Musculoskeletal: Mild grade 1 anterolisthesis of L4 on L5 likely on the basis of degenerative disc disease and facet arthropathy. Vacuum disc phenomenon noted at L3-4 and L4-5. No acute fracture or aggressive osseous lesions. IMPRESSION: 1. Patchy multilobar airspace disease at the lung bases consistent multilobar pneumonia. Stigmata of CHF believed less likely given lack of significant pleural effusion or cardiomegaly. 2. Significant stool retention throughout the colon consistent constipation with probable fecal impaction. 3. Tiny too small to characterize cysts of the liver and both kidneys. Electronically Signed   By: Tollie Eth M.D.   On: 12/27/2018 01:02     Not all labs, radiology exams or other studies done during hospitalization come through on my EPIC note; however they are reviewed by me.    Assessment and Plan  Elevated blood pressure-patient's blood pressure medication has been increased from clonidine 0.2 to clonidine 0.3 patch along with Norvasc 10 mg daily, along with the addition of losartan 50 mg several weeks ago; plan to increase losartan to 100 mg nightly and if still uncontrolled at that time will consider Coreg or metoprolol.  Patient is not  taking p.o. very well.    [name], MD

## 2019-08-23 ENCOUNTER — Non-Acute Institutional Stay (SKILLED_NURSING_FACILITY): Payer: Medicare Other | Admitting: Internal Medicine

## 2019-08-23 ENCOUNTER — Encounter: Payer: Self-pay | Admitting: Internal Medicine

## 2019-08-23 DIAGNOSIS — R627 Adult failure to thrive: Secondary | ICD-10-CM | POA: Diagnosis not present

## 2019-08-23 DIAGNOSIS — F0150 Vascular dementia without behavioral disturbance: Secondary | ICD-10-CM | POA: Diagnosis not present

## 2019-08-23 DIAGNOSIS — E785 Hyperlipidemia, unspecified: Secondary | ICD-10-CM | POA: Diagnosis not present

## 2019-08-23 NOTE — Progress Notes (Signed)
Location:  [facility] Room Number: 110-P Place of Service:  SNF (31)  Hennie Duos, MD  Patient Care Team: Hennie Duos, MD as PCP - General (Internal Medicine)  Extended Emergency Contact Information Primary Emergency Contact: [name],[name] Address: [address]          [place], [place] [address] [place] Phone: [phone] Relation: Daughter Secondary Emergency Contact: Helayne Seminole Mobile Phone: [phone] Relation: Daughter    Allergies: Penicillins  Chief Complaint  Patient presents with  . Medical Management of Chronic Issues    Routine [facility] visit    HPI: Patient is a 78 y.o. female who is being seen for routine issues of dementia, failure to thrive, and hyperlipidemia.  Past Medical History:  Diagnosis Date  . Alzheimer's disease ([place])   . Chronic constipation   . Depressed   . Failure to thrive in adult   . HCAP (healthcare-associated pneumonia)   . Hyperlipidemia   . Hypernatremia   . Hypertension   . Hypokalemia   . Low back pain   . Migraines   . Osteoarthritis   . Osteoporosis   . PBA (pseudobulbar affect)   . Post-ictal state ([place])   . Seizures ([place])   . Urinary tract infection with hematuria   . Vitamin B12 deficiency     Past Surgical History:  Procedure Laterality Date  . ABDOMINAL HYSTERECTOMY    . CHOLECYSTECTOMY    . KNEE SURGERY     total    Allergies as of 08/23/2019      Reactions   Penicillins Other (See Comments)   Tolerated Ceftriaxone 04/2018 Other reaction(s): UNKNOWN REACTION      Medication List       Accurate as of August 23, 2019 11:59 PM. If you have any questions, ask your nurse or doctor.        acetaminophen 325 MG tablet Commonly known as: TYLENOL Take 650 mg by mouth every 6 (six) hours as needed for mild pain.   amLODipine 10 MG tablet Commonly known as: NORVASC Take 10 mg by mouth daily.   aspirin EC 81 MG EC tablet  Generic drug: aspirin Take 81 mg by mouth daily. Swallow whole.   bisacodyl 10 MG suppository Commonly known as: DULCOLAX Place 10 mg rectally daily as needed for moderate constipation (if not relieved by MOM).   calcium carbonate 1250 (500 Ca) MG chewable tablet Commonly known as: OS-CAL Chew 1 tablet by mouth daily.   cloNIDine 0.3 mg/24hr patch Commonly known as: CATAPRES - Dosed in mg/24 hr Place 0.3 mg onto the skin once a week.   D3-1000 25 MCG (1000 UT) tablet Generic drug: Cholecalciferol Take 1,000 Units by mouth daily.   FISH OIL PO Take 5 mLs by mouth daily. 1,600 MG/5 ML LIQUID- take 5 ml po daily as supplement   levETIRAcetam 100 MG/ML solution Commonly known as: KEPPRA Take 5 mLs (500 mg total) by mouth 2 (two) times daily.   LORazepam 1 MG tablet Commonly known as: ATIVAN Take 1 mg by mouth once. Give 1 tablet prior to dental procedure as directed by [facility] Start taking on: August 27, 2019   losartan 50 MG tablet Commonly known as: COZAAR Take 100 mg by mouth at bedtime.   MILK OF MAGNESIA PO Take 30 mLs by mouth daily as needed (if no BM in 3 days).   Namenda XR 28 MG Cp24 24 hr capsule Generic  drug: memantine Take 28 mg by mouth at bedtime.   Nuedexta 20-10 MG capsule Generic drug: Dextromethorphan-quiNIDine Take 1 capsule by mouth daily.   NUTRITIONAL SUPPLEMENT PO Take 1 each by mouth 3 (three) times daily. Magic Cup   Ensure Take 237 mLs by mouth 2 (two) times daily between meals.   pravastatin 40 MG tablet Commonly known as: PRAVACHOL Take 40 mg by mouth daily.   RA SALINE ENEMA RE If not relieved by Biscodyl suppository, give disposable Saline Enema rectally X 1 dose/24 hrs as needed (Do not use constipation standing orders for residents with renal failure/CFR less than 30. Contact MD for orders)(Physician Or   rivastigmine 6 MG capsule Commonly known as: EXELON Take 6 mg by mouth 2 (two) times daily.   vitamin B-12 1000  MCG tablet Commonly known as: CYANOCOBALAMIN Take 1,000 mcg by mouth daily.       No orders of the defined types were placed in this encounter.   Immunization History  Administered Date(s) Administered  . Influenza, High Dose Seasonal PF 08/01/2016  . Influenza-Unspecified 07/31/2017, 07/05/2018, 07/24/2019  . PPD Test 12/09/2017  . Pneumococcal Conjugate-13 08/01/2016  . Tdap 12/16/2014    Social History   Tobacco Use  . Smoking status: Former Games developer  . Smokeless tobacco: Never Used  Substance Use Topics  . Alcohol use: No    Review of Systems   unable to obtain secondary to dementia; nursing-no acute concerns other than patient's poor appetite    Vitals:   08/23/19 0849 08/23/19 0850  BP: (!) 144/99 (!) 162/88  Pulse: 82   Resp: 18   Temp: (!) 97.1 F (36.2 C)    Body mass index is 23.39 kg/m. Physical Exam  GENERAL APPEARANCE:  No acute distress  SKIN: No diaphoresis rash HEENT: Unremarkable RESPIRATORY: Breathing is even, unlabored. Lung sounds are clear   CARDIOVASCULAR: Heart RRR no murmurs, rubs or gallops. No peripheral edema  GASTROINTESTINAL: Abdomen is soft, non-tender, not distended w/ normal bowel sounds.  GENITOURINARY: Bladder non tender, not distended  MUSCULOSKELETAL: No abnormal joints or musculature except very very thin NEUROLOGIC: Cranial nerves 2-12 grossly intact. Moves all extremities PSYCHIATRIC: Dementia, no behavioral issues  Patient Active Problem List   Diagnosis Date Noted  . Real time reverse transcriptase PCR positive for COVID-19 virus 06/02/2019  . Malnutrition of moderate degree 12/28/2018  . Palliative care by specialist   . Goals of care, counseling/discussion   . Emesis 12/27/2018  . Enterococcus UTI 04/29/2018  . CAP (community acquired pneumonia) 04/25/2018  . Multifocal pneumonia 04/22/2018  . Post-ictal state (HCC) 02/18/2018  . Urinary tract infection with hematuria   . Seizure (HCC) 02/11/2018  . Chronic  constipation 02/09/2018  . Dementia (HCC) 12/16/2017  . Failure to thrive in adult 12/16/2017  . Hypernatremia 12/16/2017  . Hypokalemia 12/16/2017  . Hypertension 12/16/2017  . 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 Hyperlipidemia 12/16/2017  . Osteoporosis 12/16/2017  . Vitamin B12 deficiency 12/16/2017  . Depression 12/16/2017    CMP     Component Value Date/Time   NA 144 02/25/2019   K 4.8 02/25/2019   CL 111 12/29/2018 0554   CO2 27 12/29/2018 0554   GLUCOSE 100 (H) 12/29/2018 0554   BUN 8 02/25/2019   CREATININE 0.4 (A) 02/25/2019   CREATININE 0.42 (L) 12/29/2018 0554   CALCIUM 9.4 12/29/2018 0554   PROT 6.9 12/26/2018 2332   ALBUMIN 3.5 12/26/2018 2332   AST 23 02/21/2019   ALT 30 02/21/2019  ALKPHOS 101 02/21/2019   BILITOT 0.5 12/26/2018 2332   GFRNONAA >60 12/29/2018 0554   GFRAA >60 12/29/2018 0554   Recent Labs    12/26/18 2332 12/28/18 0549 12/29/18 0554 02/21/19 02/22/19 02/25/19  NA 146* 147* 147* 145 145 144  K 3.6 3.5 3.2* 4.1 4.1 4.8  CL 109 115* 111  --   --   --   CO2 27 27 27   --   --   --   GLUCOSE 122* 92 100*  --   --   --   BUN 22 13 6* 8 8 8   CREATININE 0.74 0.55 0.42* 0.4* 0.4* 0.4*  CALCIUM 9.5 8.8* 9.4  --   --   --    Recent Labs    12/26/18 2332 02/21/19  AST 20 23  ALT 12 30  ALKPHOS 78 101  BILITOT 0.5  --   PROT 6.9  --   ALBUMIN 3.5  --    Recent Labs    12/26/18 2332 12/28/18 0549 02/21/19  WBC 4.3 8.4 6.5  HGB 14.3 11.5* 13.8  HCT 45.5 35.8* 40  MCV 98.5 97.3  --   PLT 133* 102* 143*   Recent Labs    02/21/19  CHOL 130  LDLCALC 68  TRIG 82   [facility] results found for: [facility] Lab Results  Component Value Date   TSH 1.30 02/21/2019   Lab Results  Component Value Date   HGBA1C 5.5 02/21/2019   Lab Results  Component Value Date   CHOL 130 02/21/2019   HDL 45 02/21/2019   LDLCALC 68 02/21/2019   TRIG 82 02/21/2019   CHOLHDL 3.1 02/12/2018    Significant Diagnostic Results in last 30 days:  No  results found.  Assessment and Plan  Dementia (HCC) Chronic and declining; continue Namenda XR 28 mg daily and Exelon 6 mg twice daily  Failure to thrive in adult Actively failing at this time; continue supplements,'s continue supportive care  Hyperlipidemia LDL 68, good control on omega-3 1 g daily and Pravachol 40 mg daily     Margit HanksAnne D Rainey Kahrs, MD

## 2019-08-25 ENCOUNTER — Encounter: Payer: Self-pay | Admitting: Internal Medicine

## 2019-08-25 NOTE — Assessment & Plan Note (Signed)
Chronic and declining; continue Namenda XR 28 mg daily and Exelon 6 mg twice daily

## 2019-08-25 NOTE — Assessment & Plan Note (Signed)
LDL 68, good control on omega-3 1 g daily and Pravachol 40 mg daily

## 2019-08-25 NOTE — Assessment & Plan Note (Signed)
Actively failing at this time; continue supplements,'s continue supportive care

## 2019-08-28 ENCOUNTER — Non-Acute Institutional Stay (SKILLED_NURSING_FACILITY): Payer: Medicare Other | Admitting: Internal Medicine

## 2019-08-28 DIAGNOSIS — R21 Rash and other nonspecific skin eruption: Secondary | ICD-10-CM | POA: Diagnosis not present

## 2019-08-28 LAB — VITAMIN D 25 HYDROXY (VIT D DEFICIENCY, FRACTURES): Vit D, 25-Hydroxy: 34.92

## 2019-08-28 NOTE — Progress Notes (Signed)
This is an acute visit.  Level of care skilled.  [facility]  Chief complaint-acute visit secondary to suspected rash.  History of present illness.  Patient is a 78 year old female who is a long-term resident of facility  \Nursing staff has noted what appears to be a rash of her arms bilaterally and partially of her right thorax.  Patient has significant dementia and cannot give any review of systems but talking with her nursing tech-apparently  has had a similar looking rash that has appeared occasionally-.  Patient does not appear to be in any discomfort nursing does not report any other associated concerns  Past Medical History:  Diagnosis Date  . Alzheimer's disease ([place])   . Chronic constipation   . Depressed   . Failure to thrive in adult   . HCAP (healthcare-associated pneumonia)   . Hyperlipidemia   . Hypernatremia   . Hypertension   . Hypokalemia   . Low back pain   . Migraines   . Osteoarthritis   . Osteoporosis   . PBA (pseudobulbar affect)   . Post-ictal state ([place])   . Seizures ([place])   . Urinary tract infection with hematuria   . Vitamin B12 deficiency          Past Surgical History:  Procedure Laterality Date  . ABDOMINAL HYSTERECTOMY    . CHOLECYSTECTOMY    . KNEE SURGERY     total         Allergies as of 08/23/2019      Reactions   Penicillins Other (See Comments)   Tolerated Ceftriaxone 04/2018 Other reaction(s): UNKNOWN REACTION         Medication List             acetaminophen 325 MG tablet Commonly known as: TYLENOL Take 650 mg by mouth every 6 (six) hours as needed for mild pain.   amLODipine 10 MG tablet Commonly known as: NORVASC Take 10 mg by mouth daily.   aspirin EC 81 MG EC tablet Generic drug: aspirin Take 81 mg by mouth daily. Swallow whole.   bisacodyl 10 MG suppository Commonly known as: DULCOLAX Place 10 mg rectally daily as needed for moderate  constipation (if not relieved by MOM).   calcium carbonate 1250 (500 Ca) MG chewable tablet Commonly known as: OS-CAL Chew 1 tablet by mouth daily.   cloNIDine 0.3 mg/24hr patch Commonly known as: CATAPRES - Dosed in mg/24 hr Place 0.3 mg onto the skin once a week.   D3-1000 25 MCG (1000 UT) tablet Generic drug: Cholecalciferol Take 1,000 Units by mouth daily.   FISH OIL PO Take 5 mLs by mouth daily. 1,600 MG/5 ML LIQUID- take 5 ml po daily as supplement   levETIRAcetam 100 MG/ML solution Commonly known as: KEPPRA Take 5 mLs (500 mg total) by mouth 2 (two) times daily.   LORazepam 1 MG tablet Commonly known as: ATIVAN Take 1 mg by mouth once. Give 1 tablet prior to dental procedure as directed by [facility] Start taking on: August 27, 2019   losartan 50 MG tablet Commonly known as: COZAAR Take 100 mg by mouth at bedtime.   MILK OF MAGNESIA PO Take 30 mLs by mouth daily as needed (if no BM in 3 days).   Namenda XR 28 MG Cp24 24 hr capsule Generic drug: memantine Take 28 mg by mouth at bedtime.   Nuedexta 20-10 MG capsule Generic drug: Dextromethorphan-quiNIDine Take 1 capsule by mouth daily.   NUTRITIONAL SUPPLEMENT PO  Take 1 each by mouth 3 (three) times daily. Magic Cup   Ensure Take 237 mLs by mouth 2 (two) times daily between meals.   pravastatin 40 MG tablet Commonly known as: PRAVACHOL Take 40 mg by mouth daily.   RA SALINE ENEMA RE If not relieved by Biscodyl suppository, give disposable Saline Enema rectally X 1 dose/24 hrs as needed (Do not use constipation standing orders for residents with renal failure/CFR less than 30. Contact MD for orders)(Physician Or   rivastigmine 6 MG capsule Commonly known as: EXELON Take 6 mg by mouth 2 (two) times daily.   vitamin B-12 1000 MCG tablet Commonly known as: CYANOCOBALAMIN Take 1,000 mcg by mouth daily.       No orders of the defined types were placed in this encounter.        Immunization History  Administered Date(s) Administered  . Influenza, High Dose Seasonal PF 08/01/2016  . Influenza-Unspecified 07/31/2017, 07/05/2018, 07/24/2019  . PPD Test 12/09/2017  . Pneumococcal Conjugate-13 08/01/2016  . Tdap 12/16/2014    Social History       Tobacco Use  . Smoking status: Former Games developer  . Smokeless tobacco: Never Used  Substance Use Topics  . Alcohol use: No    Review of systems.  This is unobtainable secondary to dementia please see HPI.  Physical exam.  Temperature is 99.0 pulse 55 respiration 17 blood pressure 124/79.  In general this is an elderly female in no distress appears to be lying comfortably in bed.  Her skin is warm and dry I do note on her arms bilaterally and right thorax area there appears to be a dry rash with scaly patches I do not see significant erythema concerning for cellulitis-or increased warmth.  Oropharynx was difficult to assess patient did not really open her mouth and did not follow verbal commands  Chest is clear to auscultation with poor respiratory effort no labored breathing.  Heart is regular rhythm slightly bradycardic she does not have significant edema.  Abdomen is soft nontender with positive bowel sounds  Musculoskeletal Limited exam since she does not really follow verbal commands I did not note any acute abnormalities.  Neurologic she is alert does not verbalize   Psych findings consistent with significant dementia.  Labs.  August 28, 2019.  Vitamin D 34.92.  June 28, 2019.  Sodium 146 potassium 3.5 BUN 8.9 creatinine 0.33  Recent Labs    12/26/18 2332 12/28/18 0549 12/29/18 0554 02/21/19  NA 146* 147* 147* 145  K 3.6 3.5 3.2* 4.1  CL 109 115* 111  --   CO2 27 27 27   --   GLUCOSE 122* 92 100*  --   BUN 22 13 6* 8  CREATININE 0.74 0.55 0.42* 0.4*  CALCIUM 9.5 8.8* 9.4  --      Recent Labs (within last 365 days)      Recent Labs    12/26/18 2332 02/21/19  AST 20 23   ALT 12 30  ALKPHOS 78 101  BILITOT 0.5  --   PROT 6.9  --   ALBUMIN 3.5  --      Recent Labs (within last 365 days)        Recent Labs    07/22/18 12/26/18 2332 12/28/18 0549 02/21/19  WBC 7.2 4.3 8.4 6.5  NEUTROABS 6  --   --   --   HGB 15.4 14.3 11.5* 13.8  HCT 46 45.5 35.8* 40  MCV  --  98.5 97.3  --  PLT 120* 133* 102* 143*     Recent Labs       Lab Results  Component Value Date   TSH 1.30 02/21/2019     Recent Labs    Assessment and plan.  Rash-this appears to be fungal at this point will treat with antifungal Nizoral cream daily for 14 days monitor for resolution-- if there is any increased size warmth or erythema notify provider of such changes.  [id number]

## 2019-09-01 ENCOUNTER — Encounter: Payer: Self-pay | Admitting: Internal Medicine

## 2019-09-20 ENCOUNTER — Non-Acute Institutional Stay (SKILLED_NURSING_FACILITY): Payer: Medicare Other | Admitting: Internal Medicine

## 2019-09-20 ENCOUNTER — Encounter: Payer: Self-pay | Admitting: Internal Medicine

## 2019-09-20 DIAGNOSIS — F482 Pseudobulbar affect: Secondary | ICD-10-CM

## 2019-09-20 DIAGNOSIS — R569 Unspecified convulsions: Secondary | ICD-10-CM | POA: Diagnosis not present

## 2019-09-20 DIAGNOSIS — E538 Deficiency of other specified B group vitamins: Secondary | ICD-10-CM | POA: Diagnosis not present

## 2019-09-20 NOTE — Progress Notes (Signed)
Location:  [facility] Room Number: 203-D Place of Service:  SNF (31)  Hennie Duos, MD  Patient Care Team: Hennie Duos, MD as PCP - General (Internal Medicine)  Extended Emergency Contact Information Primary Emergency Contact: [name],[name] Address: [address]          [place], [place] [address] [place] Phone: [phone] Relation: Daughter Secondary Emergency Contact: Helayne Seminole Mobile Phone: [phone] Relation: Daughter    Allergies: Penicillins  Chief Complaint  Patient presents with  . Medical Management of Chronic Issues    Routine [facility] visit    HPI: Patient is a 78 y.o. female who is being seen for routine issues of B12 deficiency, seizures, and PBA.  Past Medical History:  Diagnosis Date  . Alzheimer's disease ([place])   . Chronic constipation   . Depressed   . Failure to thrive in adult   . HCAP (healthcare-associated pneumonia)   . Hyperlipidemia   . Hypernatremia   . Hypertension   . Hypokalemia   . Low back pain   . Migraines   . Osteoarthritis   . Osteoporosis   . PBA (pseudobulbar affect)   . Post-ictal state ([place])   . Seizures ([place])   . Urinary tract infection with hematuria   . Vitamin B12 deficiency     Past Surgical History:  Procedure Laterality Date  . ABDOMINAL HYSTERECTOMY    . CHOLECYSTECTOMY    . KNEE SURGERY     total    Allergies as of 09/20/2019      Reactions   Penicillins Other (See Comments)   Tolerated Ceftriaxone 04/2018 Other reaction(s): UNKNOWN REACTION      Medication List       Accurate as of September 20, 2019 11:59 PM. If you have any questions, ask your nurse or doctor.        STOP taking these medications   acetaminophen 325 MG tablet Commonly known as: TYLENOL Stopped by: Inocencio Homes, MD     TAKE these medications   amLODipine 10 MG tablet Commonly known as: NORVASC Take 10 mg by mouth daily.   aspirin EC 81 MG  EC tablet Generic drug: aspirin Take 81 mg by mouth daily. Swallow whole.   bisacodyl 10 MG suppository Commonly known as: DULCOLAX Place 10 mg rectally daily as needed for moderate constipation (if not relieved by MOM).   calcium carbonate 1250 (500 Ca) MG chewable tablet Commonly known as: OS-CAL Chew 1 tablet by mouth daily.   cloNIDine 0.3 mg/24hr patch Commonly known as: CATAPRES - Dosed in mg/24 hr Place 0.3 mg onto the skin once a week.   D3-1000 25 MCG (1000 UT) tablet Generic drug: Cholecalciferol Take 1,000 Units by mouth daily.   FISH OIL PO Take 5 mLs by mouth daily. 1,600 MG/5 ML LIQUID- take 5 ml po daily as supplement   levETIRAcetam 100 MG/ML solution Commonly known as: KEPPRA Take 5 mLs (500 mg total) by mouth 2 (two) times daily.   losartan 50 MG tablet Commonly known as: COZAAR Take 100 mg by mouth at bedtime.   MILK OF MAGNESIA PO Take 30 mLs by mouth daily as needed (if no BM in 3 days).   Namenda XR 28 MG Cp24 24 hr capsule Generic drug: memantine Take 28 mg by mouth at bedtime.   Nuedexta 20-10 MG capsule Generic drug: Dextromethorphan-quiNIDine Take 1 capsule by mouth daily.   NUTRITIONAL SUPPLEMENT PO Take 1  each by mouth 3 (three) times daily. Magic Cup   Ensure Take 237 mLs by mouth 2 (two) times daily between meals.   pravastatin 40 MG tablet Commonly known as: PRAVACHOL Take 40 mg by mouth daily.   RA SALINE ENEMA RE If not relieved by Biscodyl suppository, give disposable Saline Enema rectally X 1 dose/24 hrs as needed (Do not use constipation standing orders for residents with renal failure/CFR less than 30. Contact MD for orders)(Physician Or   rivastigmine 6 MG capsule Commonly known as: EXELON Take 6 mg by mouth 2 (two) times daily.   vitamin B-12 1000 MCG tablet Commonly known as: CYANOCOBALAMIN Take 1,000 mcg by mouth daily.       No orders of the defined types were placed in this encounter.   Immunization  History  Administered Date(s) Administered  . Influenza, High Dose Seasonal PF 08/01/2016  . Influenza-Unspecified 07/31/2017, 07/05/2018, 07/24/2019  . PPD Test 12/09/2017  . Pneumococcal Conjugate-13 08/01/2016  . Tdap 12/16/2014    Social History   Tobacco Use  . Smoking status: Former Games developermoker  . Smokeless tobacco: Never Used  Substance Use Topics  . Alcohol use: No    Review of Systems unable to obtain secondary to condition; nursing-no acute concerns    Vitals:   09/20/19 1444  BP: 140/80  Pulse: 80  Resp: 17  Temp: (!) 97.1 F (36.2 C)  SpO2: 96%   Body mass index is 23.21 kg/m. Physical Exam  GENERAL APPEARANCE: Alert,  No acute distress  SKIN: No diaphoresis rash HEENT: Unremarkable RESPIRATORY: Breathing is even, unlabored. Lung sounds are clear   CARDIOVASCULAR: Heart RRR no murmurs, rubs or gallops. No peripheral edema  GASTROINTESTINAL: Abdomen is soft, non-tender, not distended w/ normal bowel sounds.  GENITOURINARY: Bladder non tender, not distended  MUSCULOSKELETAL: No abnormal joints or musculature except thin NEUROLOGIC: Cranial nerves 2-12 grossly intact. Moves all extremities PSYCHIATRIC: Dementia, no behavioral issues  Patient Active Problem List   Diagnosis Date Noted  . Real time reverse transcriptase PCR positive for COVID-19 virus 06/02/2019  . Malnutrition of moderate degree 12/28/2018  . Palliative care by specialist   . Goals of care, counseling/discussion   . Emesis 12/27/2018  . Enterococcus UTI 04/29/2018  . CAP (community acquired pneumonia) 04/25/2018  . Multifocal pneumonia 04/22/2018  . Post-ictal state (HCC) 02/18/2018  . Urinary tract infection with hematuria   . Seizure (HCC) 02/11/2018  . Chronic constipation 02/09/2018  . Dementia (HCC) 12/16/2017  . Failure to thrive in adult 12/16/2017  . Hypernatremia 12/16/2017  . Hypokalemia 12/16/2017  . Hypertension 12/16/2017  . 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  Hyperlipidemia 12/16/2017  . Osteoporosis 12/16/2017  . Vitamin B12 deficiency 12/16/2017  . Depression 12/16/2017    CMP     Component Value Date/Time   NA 144 02/25/2019 0000   K 4.8 02/25/2019 0000   CL 111 12/29/2018 0554   CO2 27 12/29/2018 0554   GLUCOSE 100 (H) 12/29/2018 0554   BUN 8 02/25/2019 0000   CREATININE 0.4 (A) 02/25/2019 0000   CREATININE 0.42 (L) 12/29/2018 0554   CALCIUM 9.4 12/29/2018 0554   PROT 6.9 12/26/2018 2332   ALBUMIN 3.5 12/26/2018 2332   AST 23 02/21/2019 0000   ALT 30 02/21/2019 0000   ALKPHOS 101 02/21/2019 0000   BILITOT 0.5 12/26/2018 2332   GFRNONAA >60 12/29/2018 0554   GFRAA >60 12/29/2018 0554   Recent Labs    12/26/18 2332 12/28/18 0549 12/29/18 0554 02/21/19  0000 02/22/19 0000 02/25/19 0000  NA 146* 147* 147* 145 145 144  K 3.6 3.5 3.2* 4.1 4.1 4.8  CL 109 115* 111  --   --   --   CO2 27 27 27   --   --   --   GLUCOSE 122* 92 100*  --   --   --   BUN 22 13 6* 8 8 8   CREATININE 0.74 0.55 0.42* 0.4* 0.4* 0.4*  CALCIUM 9.5 8.8* 9.4  --   --   --    Recent Labs    12/26/18 2332 02/21/19 0000  AST 20 23  ALT 12 30  ALKPHOS 78 101  BILITOT 0.5  --   PROT 6.9  --   ALBUMIN 3.5  --    Recent Labs    12/26/18 2332 12/28/18 0549 02/21/19 0000  WBC 4.3 8.4 6.5  HGB 14.3 11.5* 13.8  HCT 45.5 35.8* 40  MCV 98.5 97.3  --   PLT 133* 102* 143*   Recent Labs    02/21/19 0000  CHOL 130  LDLCALC 68  TRIG 82   No results found for: [facility] Lab Results  Component Value Date   TSH 1.30 02/21/2019   Lab Results  Component Value Date   HGBA1C 5.5 02/21/2019   Lab Results  Component Value Date   CHOL 130 02/21/2019   HDL 45 02/21/2019   LDLCALC 68 02/21/2019   TRIG 82 02/21/2019   CHOLHDL 3.1 02/12/2018    Significant Diagnostic Results in last 30 days:  No results found.  Assessment and Plan    Vitamin B12 deficiency Chronic and stable; continue vitamin B-12 1000 units daily  Seizure (HCC) No  reported seizures; continue Keppra 500 mg twice daily  PBA (pseudobulbar affect) Chronic and stable; continue Nuedexta 1 p.o. daily    04/23/2019, MD

## 2019-09-28 ENCOUNTER — Encounter: Payer: Self-pay | Admitting: Internal Medicine

## 2019-09-28 NOTE — Assessment & Plan Note (Signed)
No reported seizures; continue Keppra 500 mg twice daily 

## 2019-09-28 NOTE — Assessment & Plan Note (Signed)
Chronic and stable; continue vitamin B-12 1000 units daily

## 2019-09-28 NOTE — Assessment & Plan Note (Signed)
Chronic and stable; continue Nuedexta 1 p.o. daily

## 2019-10-21 ENCOUNTER — Non-Acute Institutional Stay (SKILLED_NURSING_FACILITY): Payer: Medicare Other | Admitting: Internal Medicine

## 2019-10-21 ENCOUNTER — Encounter: Payer: Self-pay | Admitting: Internal Medicine

## 2019-10-21 DIAGNOSIS — M810 Age-related osteoporosis without current pathological fracture: Secondary | ICD-10-CM

## 2019-10-21 DIAGNOSIS — K5909 Other constipation: Secondary | ICD-10-CM | POA: Diagnosis not present

## 2019-10-21 DIAGNOSIS — I10 Essential (primary) hypertension: Secondary | ICD-10-CM

## 2019-10-21 NOTE — Progress Notes (Signed)
Location:  [facility] Room Number: 203-D Place of Service:  SNF (31)  [name], MD  Patient Care Team: [name], MD as PCP - General (Internal Medicine)  Extended Emergency Contact Information Primary Emergency Contact: [name],[name] Address: [address]          [place], [place] [address] [place] Home Phone: [phone] Relation: Daughter Secondary Emergency Contact: [name] Mobile Phone: [phone] Relation: Daughter    Allergies: Penicillins  Chief Complaint  Patient presents with  . Medical Management of Chronic Issues    Routine [facility] visit    HPI: Patient is a 79 y.o. female who is being seen for routine issues of hypertension, constipation, and osteoporosis.  Past Medical History:  Diagnosis Date  . Alzheimer's disease (HCC)   . Chronic constipation   . Depressed   . Failure to thrive in adult   . HCAP (healthcare-associated pneumonia)   . Hyperlipidemia   . Hypernatremia   . Hypertension   . Hypokalemia   . Low back pain   . Migraines   . Osteoarthritis   . Osteoporosis   . PBA (pseudobulbar affect)   . Post-ictal state (HCC)   . Seizures (HCC)   . Urinary tract infection with hematuria   . Vitamin B12 deficiency     Past Surgical History:  Procedure Laterality Date  . ABDOMINAL HYSTERECTOMY    . CHOLECYSTECTOMY    . KNEE SURGERY     total    Allergies as of 10/21/2019      Reactions   Penicillins Other (See Comments)   Tolerated Ceftriaxone 04/2018 Other reaction(s): UNKNOWN REACTION      Medication List       Accurate as of October 21, 2019 11:59 PM. If you have any questions, ask your nurse or doctor.        amLODipine 10 MG tablet Commonly known as: NORVASC Take 10 mg by mouth daily.   aspirin EC 81 MG EC tablet Generic drug: aspirin Take 81 mg by mouth daily. Swallow whole.   bisacodyl 10 MG suppository Commonly known as: DULCOLAX Place  10 mg rectally daily as needed for moderate constipation (if not relieved by MOM).   calcium carbonate 1250 (500 Ca) MG chewable tablet Commonly known as: OS-CAL Chew 1 tablet by mouth daily.   cloNIDine 0.3 mg/24hr patch Commonly known as: CATAPRES - Dosed in mg/24 hr Place 0.3 mg onto the skin once a week.   D3-1000 25 MCG (1000 UT) tablet Generic drug: Cholecalciferol Take 1,000 Units by mouth daily.   FISH OIL PO Take 5 mLs by mouth daily. 1,600 MG/5 ML LIQUID- take 5 ml po daily as supplement   levETIRAcetam 100 MG/ML solution Commonly known as: KEPPRA Take 5 mLs (500 mg total) by mouth 2 (two) times daily.   losartan 50 MG tablet Commonly known as: COZAAR Take 100 mg by mouth at bedtime.   MILK OF MAGNESIA PO Take 30 mLs by mouth daily as needed (if no BM in 3 days).   Namenda XR 28 MG Cp24 24 hr capsule Generic drug: memantine Take 28 mg by mouth at bedtime.   Nuedexta 20-10 MG capsule Generic drug: Dextromethorphan-quiNIDine Take 1 capsule by mouth daily.   NUTRITIONAL SUPPLEMENT PO Take 1 each by mouth 3 (three) times daily. Magic Cup   Ensure Take 237 mLs by mouth 2 (two) times daily between meals.   pravastatin 40 MG tablet  Commonly known as: PRAVACHOL Take 40 mg by mouth daily.   RA SALINE ENEMA RE If not relieved by Biscodyl suppository, give disposable Saline Enema rectally X 1 dose/24 hrs as needed (Do not use constipation standing orders for residents with renal failure/CFR less than 30. Contact MD for orders)(Physician Or   rivastigmine 6 MG capsule Commonly known as: EXELON Take 6 mg by mouth 2 (two) times daily.   vitamin B-12 1000 MCG tablet Commonly known as: CYANOCOBALAMIN Take 1,000 mcg by mouth daily.       No orders of the defined types were placed in this encounter.   Immunization History  Administered Date(s) Administered  . Influenza, High Dose Seasonal PF 08/01/2016  . Influenza-Unspecified 07/31/2017, 07/05/2018,  07/24/2019  . PPD Test 12/09/2017  . Pneumococcal Conjugate-13 08/01/2016  . Tdap 12/16/2014    Social History   Tobacco Use  . Smoking status: Former Games developer  . Smokeless tobacco: Never Used  Substance Use Topics  . Alcohol use: No    Review of Systems-unable to obtain secondary to dementia; nursing-no acute concerns    Vitals:   10/21/19 1441  BP: 140/71  Pulse: 87  Resp: 19  Temp: 97.6 F (36.4 C)   Body mass index is 23.02 kg/m. Physical Exam  GENERAL APPEARANCE: Alert, No acute distress  SKIN: No diaphoresis rash HEENT: Unremarkable RESPIRATORY: Breathing is even, unlabored. Lung sounds are clear   CARDIOVASCULAR: Heart RRR no murmurs, rubs or gallops. No peripheral edema  GASTROINTESTINAL: Abdomen is soft, non-tender, not distended w/ normal bowel sounds.  GENITOURINARY: Bladder non tender, not distended  MUSCULOSKELETAL: No abnormal joints or musculature NEUROLOGIC: Cranial nerves 2-12 grossly intact. Moves all extremities PSYCHIATRIC: Dementia, no behavioral issues  Patient Active Problem List   Diagnosis Date Noted  . Real time reverse transcriptase PCR positive for COVID-19 virus 06/02/2019  . Malnutrition of moderate degree 12/28/2018  . Palliative care by specialist   . Goals of care, counseling/discussion   . Emesis 12/27/2018  . Enterococcus UTI 04/29/2018  . CAP (community acquired pneumonia) 04/25/2018  . Multifocal pneumonia 04/22/2018  . Post-ictal state (HCC) 02/18/2018  . Urinary tract infection with hematuria   . Seizure (HCC) 02/11/2018  . Chronic constipation 02/09/2018  . Dementia (HCC) 12/16/2017  . Failure to thrive in adult 12/16/2017  . Hypernatremia 12/16/2017  . Hypokalemia 12/16/2017  . Hypertension 12/16/2017  . 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 Hyperlipidemia 12/16/2017  . Osteoporosis 12/16/2017  . Vitamin B12 deficiency 12/16/2017  . Depression 12/16/2017    CMP     Component Value Date/Time   NA 146  06/21/2019 0000   K 4.4 06/21/2019 0000   CL 105 06/21/2019 0000   CO2 25 (A) 06/21/2019 0000   GLUCOSE 100 (H) 12/29/2018 0554   BUN 7 06/21/2019 0000   CREATININE 0.4 (A) 06/21/2019 0000   CREATININE 0.42 (L) 12/29/2018 0554   CALCIUM 8.9 06/28/2019 0000   PROT 6.9 12/26/2018 2332   ALBUMIN 3.5 12/26/2018 2332   AST 23 02/21/2019 0000   ALT 30 02/21/2019 0000   ALKPHOS 101 02/21/2019 0000   BILITOT 0.5 12/26/2018 2332   GFRNONAA 90 06/21/2019 0000   GFRAA 90 06/21/2019 0000   Recent Labs    12/26/18 2332 12/28/18 0549 12/28/18 0549 12/29/18 0554 02/25/19 0000 05/21/19 0000 06/21/19 0000 06/28/19 0000  NA 146* 147*   < > 147* 144 145 146  --   K 3.6 3.5  --  3.2* 4.8 3.9 4.4  --  CL 109 115*  --  111  --  104 105  --   CO2 27 27  --  27  --  27* 25*  --   GLUCOSE 122* 92  --  100*  --   --   --   --   BUN 22 13   < > 6* 8 8 7   --   CREATININE 0.74 0.55   < > 0.42* 0.4* 0.4* 0.4*  --   CALCIUM 9.5 8.8*  --  9.4  --  9.5 9.3 8.9   < > = values in this interval not displayed.   Recent Labs    12/26/18 2332 02/21/19 0000  AST 20 23  ALT 12 30  ALKPHOS 78 101  BILITOT 0.5  --   PROT 6.9  --   ALBUMIN 3.5  --    Recent Labs    12/26/18 2332 12/28/18 0549 02/21/19 0000 06/21/19 0000  WBC 4.3 8.4 6.5 4.1  HGB 14.3 11.5* 13.8 14.0  HCT 45.5 35.8* 40 42  MCV 98.5 97.3  --   --   PLT 133* 102* 143* 173   Recent Labs    02/21/19 0000  CHOL 130  LDLCALC 68  TRIG 82   No results found for: [facility] Lab Results  Component Value Date   TSH 1.30 02/21/2019   Lab Results  Component Value Date   HGBA1C 5.5 02/21/2019   Lab Results  Component Value Date   CHOL 130 02/21/2019   HDL 45 02/21/2019   LDLCALC 68 02/21/2019   TRIG 82 02/21/2019   CHOLHDL 3.1 02/12/2018    Significant Diagnostic Results in last 30 days:  No results found.  Assessment and Plan  Hypertension Controlled today; continue Norvasc 10 mg daily, clonidine patch 0.3 mg,  losartan 100 mg daily  Chronic constipation Stable; continue MiraLAX 17 g daily  Osteoporosis Stable with no fractures continue Os-Cal 1250 mg chewables 1 p.o. daily and vitamin D 1000 units daily    Hennie Duos, MD

## 2019-10-24 IMAGING — DX DG CHEST 1V PORT
1 series · 1 of 1 positions shown · non-contrast
Comparison: 02/11/2018

CLINICAL DATA: Weakness.

EXAM:
PORTABLE CHEST 1 VIEW

[chest]
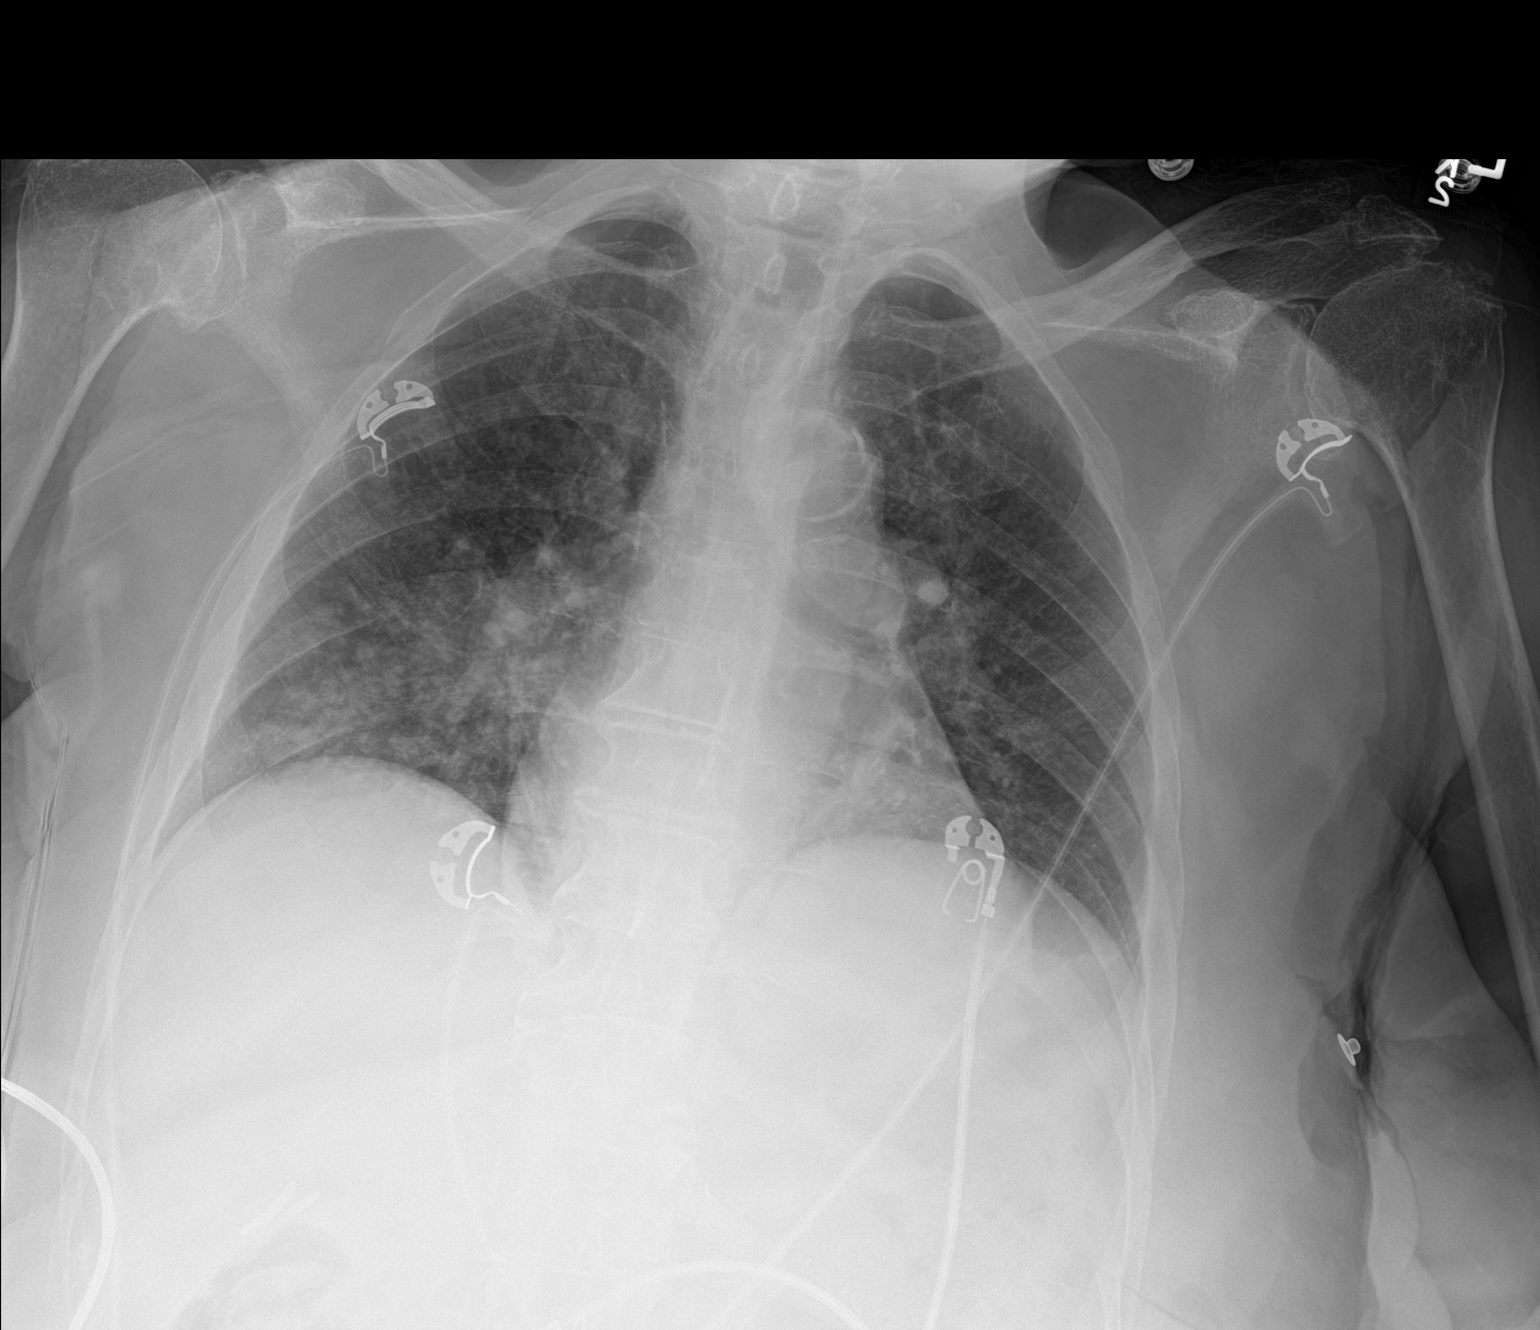

[1 of 1 positions shown; findings below may reference images not displayed]

FINDINGS: Airspace opacity newly seen at the right base. No Kerley lines,
effusion, or pneumothorax. Normal heart size and mediastinal
contours.
IMPRESSION: Pneumonia on the right. Followup PA and lateral chest X-ray is
recommended in 3-4 weeks following trial of antibiotic therapy to
ensure resolution.

## 2019-10-25 IMAGING — MR MR HEAD W/O CM
9 of 10 series · 42 of 48 positions shown · non-contrast
Comparison: 02/12/2018

CLINICAL DATA: Encephalopathy

EXAM:
MRI HEAD WITHOUT CONTRAST
TECHNIQUE: Multiplanar, multiecho pulse sequences of the brain and surrounding
structures were obtained without intravenous contrast.

[Series 3: DWI · axial · 3.0mm · 0.94mm/px · z∈[-89,+67]mm · 9 of 108 slices shown (1 of 2)]
[im 1/108]
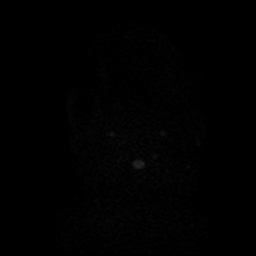
[im 20/108]
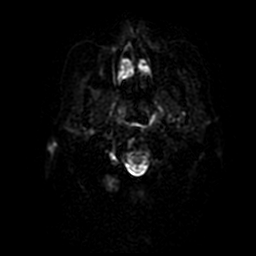
[im 30/108]
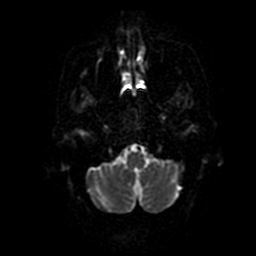
[im 49/108]
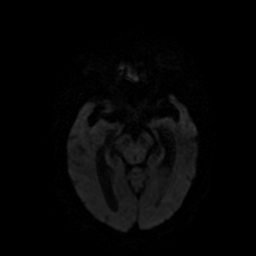
[im 59/108]
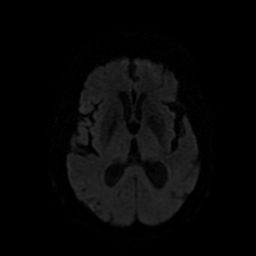
[im 78/108]
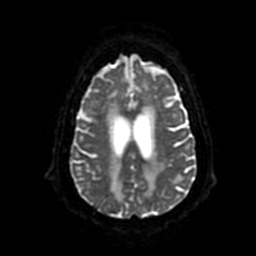
[im 88/108]
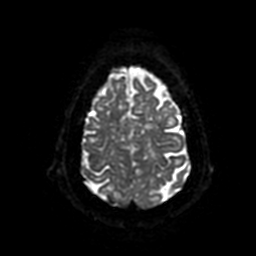
[im 98/108]
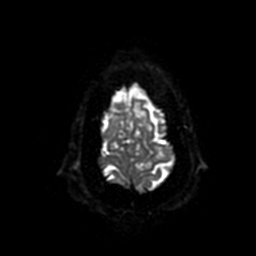
[im 108/108]
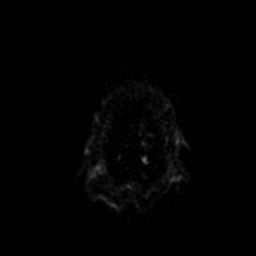

[Series 4: DWI · coronal · 4.0mm · 0.94mm/px · 8 of 74 slices shown (2 of 2)]
[im 1/74]
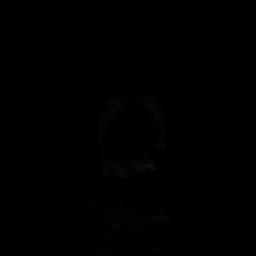
[im 11/74]
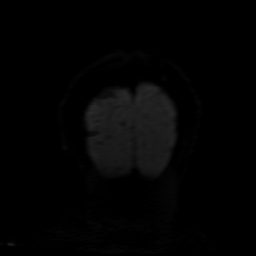
[im 21/74]
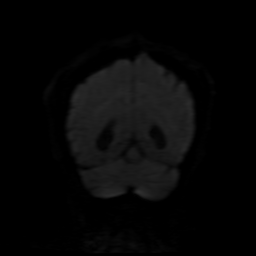
[im 32/74]
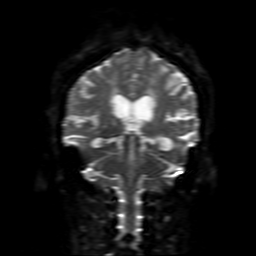
[im 42/74]
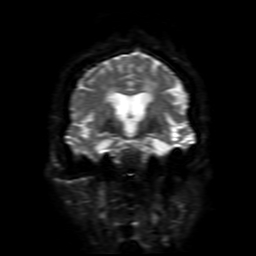
[im 53/74]
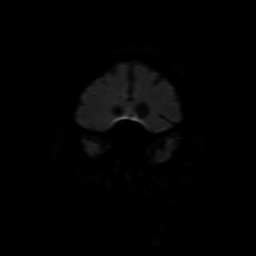
[im 63/74]
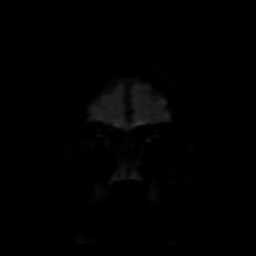
[im 74/74]
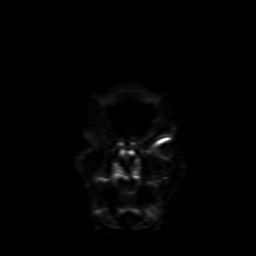

[Series 5: FLAIR · sagittal · 5.0mm · 0.47mm/px · 3 of 23 slices shown (1 of 2)]
[im 1/23]
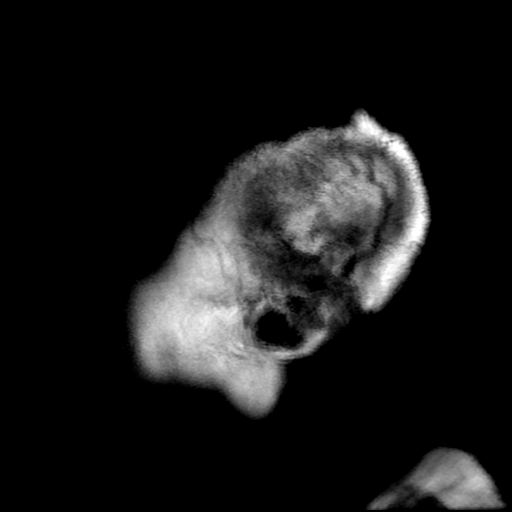
[im 12/23]
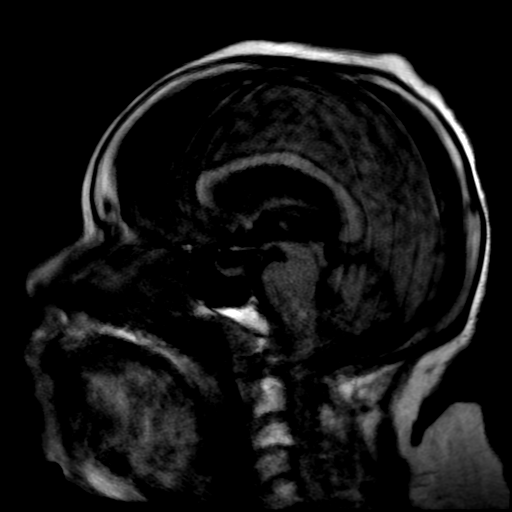
[im 23/23]
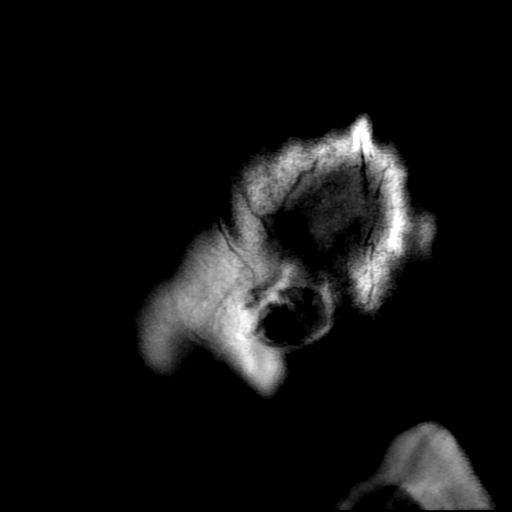

[Series 7: T2 · axial · 5.0mm · 0.47mm/px · z∈[-86,+56]mm · 3 of 25 slices shown (1 of 2)]
[im 1/25]
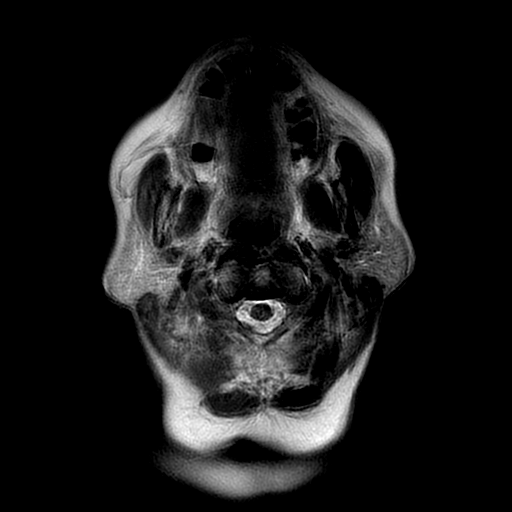
[im 13/25]
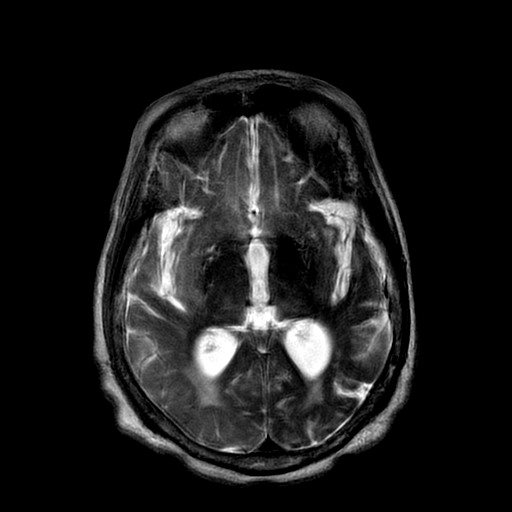
[im 25/25]
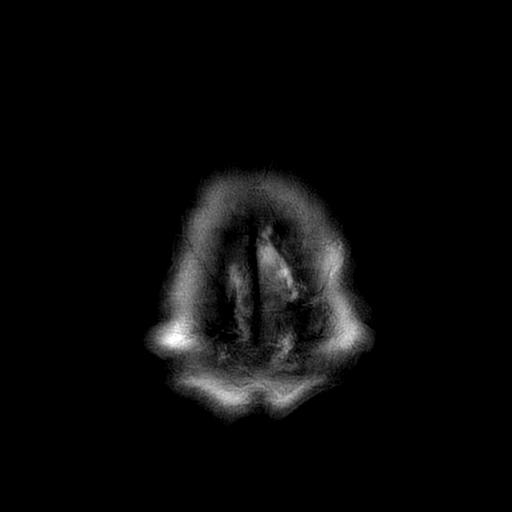

[Series 9: FLAIR · axial · 5.0mm · 0.47mm/px · z∈[-88,+54]mm · 3 of 25 slices shown (2 of 2)]
[im 1/25]
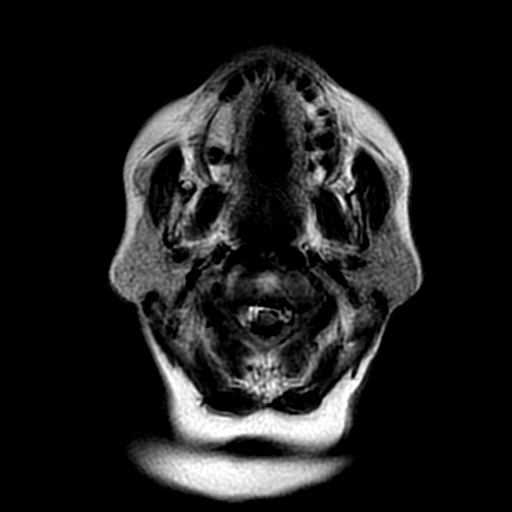
[im 13/25]
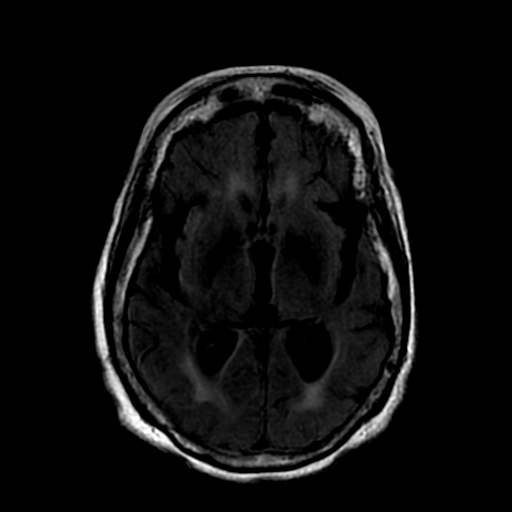
[im 25/25]
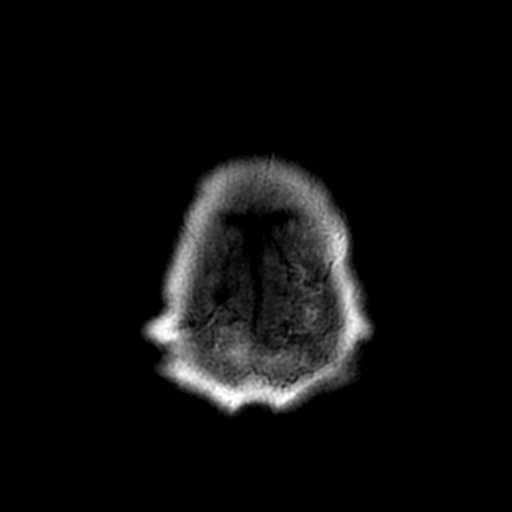

[Series 11: T1 · axial · 5.0mm · 0.94mm/px · z∈[-86,+50]mm · 3 of 24 slices shown]
[im 1/24]
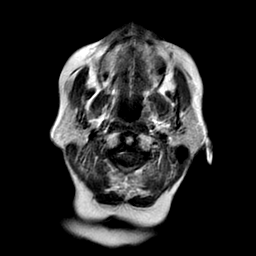
[im 12/24]
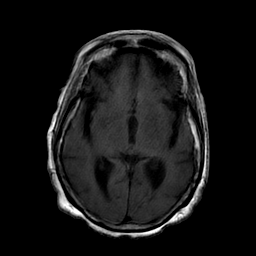
[im 24/24]
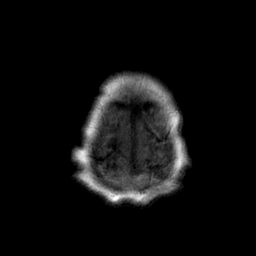

[Series 12: T2 · coronal · 5.0mm · 0.94mm/px · 3 of 26 slices shown (2 of 2)]
[im 1/26]
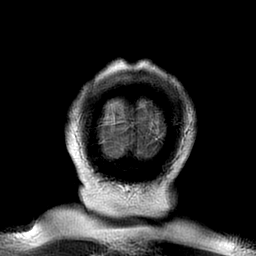
[im 13/26]
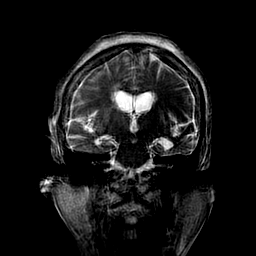
[im 26/26]
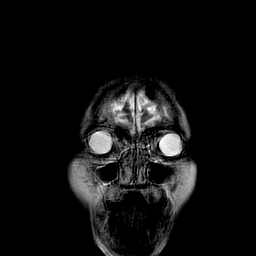

[Series 350: ADC · axial · 3.0mm · 0.94mm/px · z∈[-89,+67]mm · 6 of 54 slices shown (1 of 2)]
[im 1/54]
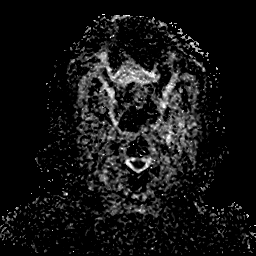
[im 11/54]
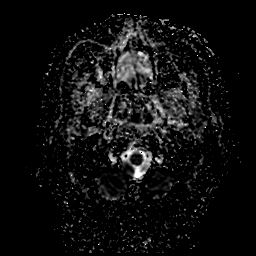
[im 22/54]
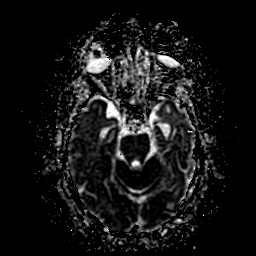
[im 32/54]
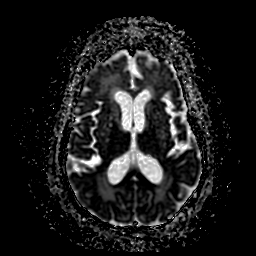
[im 43/54]
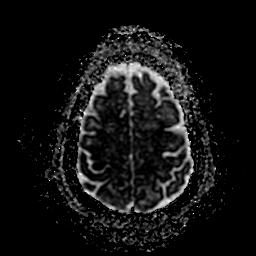
[im 54/54]
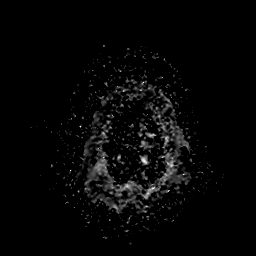

[Series 450: ADC · coronal · 4.0mm · 0.94mm/px · 4 of 37 slices shown (2 of 2)]
[im 1/37]
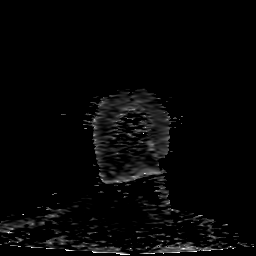
[im 13/37]
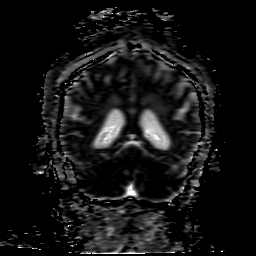
[im 25/37]
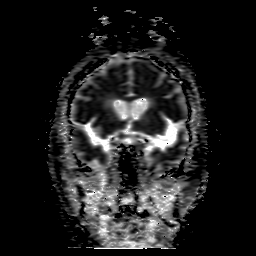
[im 37/37]
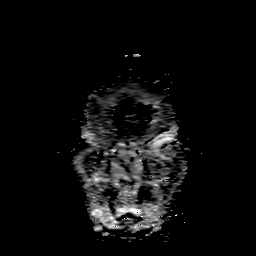

[42 of 48 positions shown; findings below may reference images not displayed]

FINDINGS: Brain: No acute infarction, hemorrhage, hydrocephalus, extra-axial
collection or mass lesion.

Atrophy, advanced in the medial temporal lobes.

Advanced chronic small vessel ischemia with confluent gliosis in the
deep cerebral white matter.

Multiple remote micro hemorrhages clustered in the posterior
superficial cerebrum.

Vascular: Major flow voids are preserved

Skull and upper cervical spine: No evidence of marrow lesion.

Sinuses/Orbits: No acute finding
IMPRESSION: 1. Significantly motion degraded exam without acute finding.
2. Advanced medial temporal atrophy in keeping with history of
Alzheimer's disease.
3. Moderate to advanced chronic small vessel ischemia in the
cerebral white matter.
4. Multiple remote micro hemorrhages clustered along posterior
cerebral convexities, question previous trauma given this pattern.
Amyloid angiopathy could have this appearance.

## 2019-10-26 ENCOUNTER — Encounter: Payer: Self-pay | Admitting: Internal Medicine

## 2019-10-26 NOTE — Assessment & Plan Note (Signed)
Stable; continue MiraLAX 17 g daily

## 2019-10-26 NOTE — Assessment & Plan Note (Signed)
Stable with no fractures continue Os-Cal 1250 mg chewables 1 p.o. daily and vitamin D 1000 units daily

## 2019-10-26 NOTE — Assessment & Plan Note (Signed)
Controlled today; continue Norvasc 10 mg daily, clonidine patch 0.3 mg, losartan 100 mg daily

## 2019-10-29 ENCOUNTER — Non-Acute Institutional Stay (SKILLED_NURSING_FACILITY): Payer: Medicare Other | Admitting: Internal Medicine

## 2019-10-29 ENCOUNTER — Encounter: Payer: Self-pay | Admitting: Internal Medicine

## 2019-10-29 DIAGNOSIS — F03C0 Unspecified dementia, severe, without behavioral disturbance, psychotic disturbance, mood disturbance, and anxiety: Secondary | ICD-10-CM

## 2019-10-29 DIAGNOSIS — I69991 Dysphagia following unspecified cerebrovascular disease: Secondary | ICD-10-CM

## 2019-10-29 DIAGNOSIS — F0390 Unspecified dementia without behavioral disturbance: Secondary | ICD-10-CM

## 2019-10-29 DIAGNOSIS — Z7189 Other specified counseling: Secondary | ICD-10-CM | POA: Diagnosis not present

## 2019-10-29 NOTE — Progress Notes (Signed)
Location:  [facility] Room Number: 203-D Place of Service:  SNF (31)  [name], MD  Patient Care Team: [name], MD as PCP - General (Internal Medicine)  Extended Emergency Contact Information Primary Emergency Contact: [name],[name] Address: [address]          [place], [place] [address] [place] Home Phone: [phone] Relation: Daughter Secondary Emergency Contact: [name] Mobile Phone: [phone] Relation: Daughter    Allergies: Penicillins  Chief Complaint  Patient presents with  . Acute Visit    Patient seen for end of life care.    HPI: Patient is 79 y.o. female who is being seen to discuss with her 2 daughters comfort care for her mother.  The director of nursing Weston Settle and I, our meeting meeting over the phone and then later in person when they come to visit their mother.  Patient has had a very poor quality of life for several months.  Most recently she has begun to be unable to swallow so she cannot eat, but she is not interested in eating and she also cannot swallow her own secretions, they pulled the back of her throat.  This was relayed to patient's 2 daughters in an effort to get a comfort care resolution and the 2 daughters are coming over to see their mother today.  Past Medical History:  Diagnosis Date  . Alzheimer's disease (HCC)   . Chronic constipation   . Depressed   . Failure to thrive in adult   . HCAP (healthcare-associated pneumonia)   . Hyperlipidemia   . Hypernatremia   . Hypertension   . Hypokalemia   . Low back pain   . Migraines   . Osteoarthritis   . Osteoporosis   . PBA (pseudobulbar affect)   . Post-ictal state (HCC)   . Seizures (HCC)   . Urinary tract infection with hematuria   . Vitamin B12 deficiency     Past Surgical History:  Procedure Laterality Date  . ABDOMINAL HYSTERECTOMY    . CHOLECYSTECTOMY    . KNEE SURGERY     total     Allergies as of 10/29/2019      Reactions   Penicillins Other (See Comments)   Tolerated Ceftriaxone 04/2018 Other reaction(s): UNKNOWN REACTION      Medication List       Accurate as of October 29, 2019  9:11 PM. If you have any questions, ask your nurse or doctor.        amLODipine 10 MG tablet Commonly known as: NORVASC Take 10 mg by mouth daily.   aspirin EC 81 MG EC tablet Generic drug: aspirin Take 81 mg by mouth daily. Swallow whole.   bisacodyl 10 MG suppository Commonly known as: DULCOLAX Place 10 mg rectally daily as needed for moderate constipation (if not relieved by MOM).   calcium carbonate 1250 (500 Ca) MG chewable tablet Commonly known as: OS-CAL Chew 1 tablet by mouth daily.   cloNIDine 0.3 mg/24hr patch Commonly known as: CATAPRES - Dosed in mg/24 hr Place 0.3 mg onto the skin once a week.   D3-1000 25 MCG (1000 UT) tablet Generic drug: Cholecalciferol Take 1,000 Units by mouth daily.   FISH OIL PO Take 5 mLs by mouth daily. 1,600 MG/5 ML LIQUID- take 5 ml po daily as supplement   levETIRAcetam 100 MG/ML solution Commonly known as: KEPPRA Take 5 mLs (500 mg total) by mouth 2 (two) times daily.  losartan 50 MG tablet Commonly known as: COZAAR Take 100 mg by mouth at bedtime.   MILK OF MAGNESIA PO Take 30 mLs by mouth daily as needed (if no BM in 3 days).   Namenda XR 28 MG Cp24 24 hr capsule Generic drug: memantine Take 28 mg by mouth at bedtime.   Nuedexta 20-10 MG capsule Generic drug: Dextromethorphan-quiNIDine Take 1 capsule by mouth daily.   NUTRITIONAL SUPPLEMENT PO Take 1 each by mouth 3 (three) times daily. Magic Cup   Ensure Take 237 mLs by mouth 2 (two) times daily between meals.   pravastatin 40 MG tablet Commonly known as: PRAVACHOL Take 40 mg by mouth daily.   RA SALINE ENEMA RE If not relieved by Biscodyl suppository, give disposable Saline Enema rectally X 1 dose/24 hrs as needed (Do not use constipation  standing orders for residents with renal failure/CFR less than 30. Contact MD for orders)(Physician Or   rivastigmine 6 MG capsule Commonly known as: EXELON Take 6 mg by mouth 2 (two) times daily.   vitamin B-12 1000 MCG tablet Commonly known as: CYANOCOBALAMIN Take 1,000 mcg by mouth daily.       No orders of the defined types were placed in this encounter.   Immunization History  Administered Date(s) Administered  . Influenza, High Dose Seasonal PF 08/01/2016  . Influenza-Unspecified 07/31/2017, 07/05/2018, 07/24/2019  . Moderna SARS-COVID-2 Vaccination 10/17/2019  . PPD Test 12/09/2017  . Pneumococcal Conjugate-13 08/01/2016  . Tdap 12/16/2014    Social History   Tobacco Use  . Smoking status: Former Games developer  . Smokeless tobacco: Never Used  Substance Use Topics  . Alcohol use: No    Review of Systems unable to obtain secondary to dementia; nursing-as per history of present illness    Vitals:   10/29/19 1502  BP: 135/70  Pulse: 83  Resp: 18  Temp: 97.9 F (36.6 C)   Body mass index is 23.02 kg/m. Physical Exam  GENERAL APPEARANCE: Very sleepy nonconversant, No acute distress  SKIN: No diaphoresis rash HEENT: Unremarkable RESPIRATORY: Breathing is even, unlabored. Lung sounds are clear   CARDIOVASCULAR: Heart RRR no murmurs, rubs or gallops. No peripheral edema  GASTROINTESTINAL: Abdomen is soft, non-tender, not distended w/ normal bowel sounds.  GENITOURINARY: Bladder non tender, not distended  MUSCULOSKELETAL: Wasting and some contractures NEUROLOGIC: Cranial nerves 2-12 grossly intact; did not move any extremities this exam PSYCHIATRIC: Dementia, no behavioral issues  Patient Active Problem List   Diagnosis Date Noted  . Real time reverse transcriptase PCR positive for COVID-19 virus 06/02/2019  . Malnutrition of moderate degree 12/28/2018  . Palliative care by specialist   . Goals of care, counseling/discussion   . Emesis 12/27/2018  .  Enterococcus UTI 04/29/2018  . CAP (community acquired pneumonia) 04/25/2018  . Multifocal pneumonia 04/22/2018  . Post-ictal state (HCC) 02/18/2018  . Urinary tract infection with hematuria   . Seizure (HCC) 02/11/2018  . Chronic constipation 02/09/2018  . Dementia (HCC) 12/16/2017  . Failure to thrive in adult 12/16/2017  . Hypernatremia 12/16/2017  . Hypokalemia 12/16/2017  . Hypertension 12/16/2017  . 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 Hyperlipidemia 12/16/2017  . Osteoporosis 12/16/2017  . Vitamin B12 deficiency 12/16/2017  . Depression 12/16/2017    CMP     Component Value Date/Time   NA 146 06/21/2019 0000   K 4.4 06/21/2019 0000   CL 105 06/21/2019 0000   CO2 25 (A) 06/21/2019 0000   GLUCOSE 100 (H) 12/29/2018 0554   BUN  7 06/21/2019 0000   CREATININE 0.4 (A) 06/21/2019 0000   CREATININE 0.42 (L) 12/29/2018 0554   CALCIUM 8.9 06/28/2019 0000   PROT 6.9 12/26/2018 2332   ALBUMIN 3.5 12/26/2018 2332   AST 23 02/21/2019 0000   ALT 30 02/21/2019 0000   ALKPHOS 101 02/21/2019 0000   BILITOT 0.5 12/26/2018 2332   GFRNONAA 90 06/21/2019 0000   GFRAA 90 06/21/2019 0000   Recent Labs    12/26/18 2332 12/28/18 0549 12/28/18 0549 12/29/18 0554 02/25/19 0000 05/21/19 0000 06/21/19 0000 06/28/19 0000  NA 146* 147*   < > 147* 144 145 146  --   K 3.6 3.5  --  3.2* 4.8 3.9 4.4  --   CL 109 115*  --  111  --  104 105  --   CO2 27 27  --  27  --  27* 25*  --   GLUCOSE 122* 92  --  100*  --   --   --   --   BUN 22 13   < > 6* 8 8 7   --   CREATININE 0.74 0.55   < > 0.42* 0.4* 0.4* 0.4*  --   CALCIUM 9.5 8.8*  --  9.4  --  9.5 9.3 8.9   < > = values in this interval not displayed.   Recent Labs    12/26/18 2332 02/21/19 0000  AST 20 23  ALT 12 30  ALKPHOS 78 101  BILITOT 0.5  --   PROT 6.9  --   ALBUMIN 3.5  --    Recent Labs    12/26/18 2332 12/28/18 0549 02/21/19 0000 06/21/19 0000  WBC 4.3 8.4 6.5 4.1  HGB 14.3 11.5* 13.8 14.0  HCT 45.5 35.8*  40 42  MCV 98.5 97.3  --   --   PLT 133* 102* 143* 173   Recent Labs    02/21/19 0000  CHOL 130  LDLCALC 68  TRIG 82   No results found for: [facility] Lab Results  Component Value Date   TSH 1.30 02/21/2019   Lab Results  Component Value Date   HGBA1C 5.5 02/21/2019   Lab Results  Component Value Date   CHOL 130 02/21/2019   HDL 45 02/21/2019   LDLCALC 68 02/21/2019   TRIG 82 02/21/2019   CHOLHDL 3.1 02/12/2018    Significant Diagnostic Results in last 30 days:  No results found.  Assessment and Plan  Advanced dementia/Dysphagia/advance care planning -patient sisters have been aware of patient's declining status, sounded surprised that patient is no longer swallowing and that her secretions are pooling in her throat.  During the visit they could not of course, closure than 6 feet to her.  When I was in the room and they both called to their mother she responded minimally by opening her eyes briefly and then closing them again.  Later when they were in the room alone with her mother they stated that they asked her if she still had everything done for her to keep her alive and did she want to die and she nodded no.  Based on her somnolence 5 minutes prior question as to whether there has been some misunderstanding.  In any case patient family has decided to put a feeding tube in her.  I do not want to put a feeding tube in a dying woman but I had no choice.  She will be full hospitalization.  They had made her a DNR.  Time spent greater than 65 minutes; total time spent discussing end-of-life measures greater than 65 minutes.  [name], MD

## 2019-10-30 ENCOUNTER — Other Ambulatory Visit (HOSPITAL_COMMUNITY): Payer: Self-pay | Admitting: Internal Medicine

## 2019-10-30 DIAGNOSIS — R1310 Dysphagia, unspecified: Secondary | ICD-10-CM

## 2019-10-31 ENCOUNTER — Other Ambulatory Visit (HOSPITAL_COMMUNITY): Payer: Medicare Other

## 2019-10-31 ENCOUNTER — Other Ambulatory Visit: Payer: Self-pay | Admitting: Radiology

## 2019-11-01 ENCOUNTER — Other Ambulatory Visit: Payer: Self-pay | Admitting: Physician Assistant

## 2019-11-01 NOTE — Progress Notes (Signed)
Called facility to ensure pt has a covid test with results prior to Monday. Nurse states that she will test patient and place results on MAR prior to coming to hospital for procedure

## 2019-11-03 ENCOUNTER — Inpatient Hospital Stay (HOSPITAL_COMMUNITY)
Admission: EM | Admit: 2019-11-03 | Discharge: 2019-11-07 | DRG: 871 | Disposition: A | Discharge status: Hospice | Payer: Medicare Other | Attending: Internal Medicine | Admitting: Internal Medicine

## 2019-11-03 ENCOUNTER — Other Ambulatory Visit: Payer: Self-pay

## 2019-11-03 ENCOUNTER — Emergency Department (HOSPITAL_COMMUNITY): Payer: Medicare Other

## 2019-11-03 ENCOUNTER — Encounter (HOSPITAL_COMMUNITY): Payer: Self-pay | Admitting: Internal Medicine

## 2019-11-03 DIAGNOSIS — R627 Adult failure to thrive: Secondary | ICD-10-CM | POA: Diagnosis present

## 2019-11-03 DIAGNOSIS — G309 Alzheimer's disease, unspecified: Secondary | ICD-10-CM | POA: Diagnosis present

## 2019-11-03 DIAGNOSIS — L89152 Pressure ulcer of sacral region, stage 2: Secondary | ICD-10-CM | POA: Diagnosis present

## 2019-11-03 DIAGNOSIS — Z79899 Other long term (current) drug therapy: Secondary | ICD-10-CM

## 2019-11-03 DIAGNOSIS — Z8616 Personal history of COVID-19: Secondary | ICD-10-CM | POA: Diagnosis not present

## 2019-11-03 DIAGNOSIS — E870 Hyperosmolality and hypernatremia: Secondary | ICD-10-CM | POA: Diagnosis not present

## 2019-11-03 DIAGNOSIS — F482 Pseudobulbar affect: Secondary | ICD-10-CM | POA: Diagnosis present

## 2019-11-03 DIAGNOSIS — Z87891 Personal history of nicotine dependence: Secondary | ICD-10-CM

## 2019-11-03 DIAGNOSIS — R6520 Severe sepsis without septic shock: Secondary | ICD-10-CM | POA: Diagnosis present

## 2019-11-03 DIAGNOSIS — D696 Thrombocytopenia, unspecified: Secondary | ICD-10-CM | POA: Diagnosis present

## 2019-11-03 DIAGNOSIS — E876 Hypokalemia: Secondary | ICD-10-CM | POA: Diagnosis present

## 2019-11-03 DIAGNOSIS — E162 Hypoglycemia, unspecified: Secondary | ICD-10-CM | POA: Diagnosis not present

## 2019-11-03 DIAGNOSIS — Z8701 Personal history of pneumonia (recurrent): Secondary | ICD-10-CM

## 2019-11-03 DIAGNOSIS — J189 Pneumonia, unspecified organism: Secondary | ICD-10-CM | POA: Diagnosis present

## 2019-11-03 DIAGNOSIS — E860 Dehydration: Secondary | ICD-10-CM | POA: Diagnosis present

## 2019-11-03 DIAGNOSIS — J9601 Acute respiratory failure with hypoxia: Secondary | ICD-10-CM | POA: Diagnosis present

## 2019-11-03 DIAGNOSIS — R64 Cachexia: Secondary | ICD-10-CM | POA: Diagnosis present

## 2019-11-03 DIAGNOSIS — E785 Hyperlipidemia, unspecified: Secondary | ICD-10-CM | POA: Diagnosis present

## 2019-11-03 DIAGNOSIS — I10 Essential (primary) hypertension: Secondary | ICD-10-CM | POA: Diagnosis present

## 2019-11-03 DIAGNOSIS — F03C0 Unspecified dementia, severe, without behavioral disturbance, psychotic disturbance, mood disturbance, and anxiety: Secondary | ICD-10-CM | POA: Diagnosis present

## 2019-11-03 DIAGNOSIS — Z880 Allergy status to penicillin: Secondary | ICD-10-CM

## 2019-11-03 DIAGNOSIS — Z515 Encounter for palliative care: Secondary | ICD-10-CM | POA: Diagnosis not present

## 2019-11-03 DIAGNOSIS — M810 Age-related osteoporosis without current pathological fracture: Secondary | ICD-10-CM | POA: Diagnosis present

## 2019-11-03 DIAGNOSIS — Z66 Do not resuscitate: Secondary | ICD-10-CM | POA: Diagnosis not present

## 2019-11-03 DIAGNOSIS — K5909 Other constipation: Secondary | ICD-10-CM | POA: Diagnosis present

## 2019-11-03 DIAGNOSIS — E538 Deficiency of other specified B group vitamins: Secondary | ICD-10-CM | POA: Diagnosis present

## 2019-11-03 DIAGNOSIS — Z7189 Other specified counseling: Secondary | ICD-10-CM | POA: Diagnosis not present

## 2019-11-03 DIAGNOSIS — Z7982 Long term (current) use of aspirin: Secondary | ICD-10-CM

## 2019-11-03 DIAGNOSIS — I160 Hypertensive urgency: Secondary | ICD-10-CM | POA: Diagnosis present

## 2019-11-03 DIAGNOSIS — F0390 Unspecified dementia without behavioral disturbance: Secondary | ICD-10-CM | POA: Diagnosis present

## 2019-11-03 DIAGNOSIS — R4182 Altered mental status, unspecified: Secondary | ICD-10-CM | POA: Diagnosis not present

## 2019-11-03 DIAGNOSIS — A419 Sepsis, unspecified organism: Principal | ICD-10-CM | POA: Diagnosis present

## 2019-11-03 DIAGNOSIS — Z9049 Acquired absence of other specified parts of digestive tract: Secondary | ICD-10-CM

## 2019-11-03 DIAGNOSIS — Z820 Family history of epilepsy and other diseases of the nervous system: Secondary | ICD-10-CM

## 2019-11-03 DIAGNOSIS — F0150 Vascular dementia without behavioral disturbance: Secondary | ICD-10-CM | POA: Diagnosis not present

## 2019-11-03 DIAGNOSIS — L8990 Pressure ulcer of unspecified site, unspecified stage: Secondary | ICD-10-CM | POA: Insufficient documentation

## 2019-11-03 DIAGNOSIS — Z90710 Acquired absence of both cervix and uterus: Secondary | ICD-10-CM

## 2019-11-03 DIAGNOSIS — Z8249 Family history of ischemic heart disease and other diseases of the circulatory system: Secondary | ICD-10-CM

## 2019-11-03 DIAGNOSIS — F0280 Dementia in other diseases classified elsewhere without behavioral disturbance: Secondary | ICD-10-CM | POA: Diagnosis present

## 2019-11-03 DIAGNOSIS — Z6822 Body mass index (BMI) 22.0-22.9, adult: Secondary | ICD-10-CM

## 2019-11-03 LAB — CBC WITH DIFFERENTIAL/PLATELET
Abs Immature Granulocytes: 1.08 10*3/uL — ABNORMAL HIGH (ref 0.00–0.07)
Basophils Absolute: 0 10*3/uL (ref 0.0–0.1)
Basophils Relative: 0 %
Eosinophils Absolute: 0 10*3/uL (ref 0.0–0.5)
Eosinophils Relative: 0 %
HCT: 45.9 % (ref 36.0–46.0)
Hemoglobin: 14.1 g/dL (ref 12.0–15.0)
Immature Granulocytes: 5 %
Lymphocytes Relative: 5 %
Lymphs Abs: 1.2 10*3/uL (ref 0.7–4.0)
MCH: 31.1 pg (ref 26.0–34.0)
MCHC: 30.7 g/dL (ref 30.0–36.0)
MCV: 101.1 fL — ABNORMAL HIGH (ref 80.0–100.0)
Monocytes Absolute: 1.2 10*3/uL — ABNORMAL HIGH (ref 0.1–1.0)
Monocytes Relative: 5 %
Neutro Abs: 19.6 10*3/uL — ABNORMAL HIGH (ref 1.7–7.7)
Neutrophils Relative %: 85 %
Platelets: 100 10*3/uL — ABNORMAL LOW (ref 150–400)
RBC: 4.54 MIL/uL (ref 3.87–5.11)
RDW: 14.5 % (ref 11.5–15.5)
WBC: 23.1 10*3/uL — ABNORMAL HIGH (ref 4.0–10.5)
nRBC: 0 % (ref 0.0–0.2)

## 2019-11-03 LAB — COMPREHENSIVE METABOLIC PANEL
ALT: 11 U/L (ref 0–44)
AST: 22 U/L (ref 15–41)
Albumin: 2 g/dL — ABNORMAL LOW (ref 3.5–5.0)
Alkaline Phosphatase: 96 U/L (ref 38–126)
Anion gap: 12 (ref 5–15)
BUN: 21 mg/dL (ref 8–23)
CO2: 20 mmol/L — ABNORMAL LOW (ref 22–32)
Calcium: 8.1 mg/dL — ABNORMAL LOW (ref 8.9–10.3)
Chloride: 127 mmol/L — ABNORMAL HIGH (ref 98–111)
Creatinine, Ser: 0.88 mg/dL (ref 0.44–1.00)
GFR calc Af Amer: >60 mL/min (ref >60)
GFR calc non Af Amer: >60 mL/min (ref >60)
Glucose, Bld: 106 mg/dL — ABNORMAL HIGH (ref 70–99)
Potassium: 3.4 mmol/L — ABNORMAL LOW (ref 3.5–5.1)
Sodium: 159 mmol/L — ABNORMAL HIGH (ref 135–145)
Total Bilirubin: 2.4 mg/dL — ABNORMAL HIGH (ref 0.3–1.2)
Total Protein: 5.3 g/dL — ABNORMAL LOW (ref 6.5–8.1)

## 2019-11-03 LAB — LACTATE DEHYDROGENASE: LDH: 341 U/L — ABNORMAL HIGH (ref 98–192)

## 2019-11-03 LAB — TROPONIN I (HIGH SENSITIVITY): Troponin I (High Sensitivity): 74 ng/L — ABNORMAL HIGH (ref <18)

## 2019-11-03 LAB — RESPIRATORY PANEL BY RT PCR (FLU A&B, COVID)
Influenza A by PCR: NEGATIVE
Influenza B by PCR: NEGATIVE
SARS Coronavirus 2 by RT PCR: NEGATIVE

## 2019-11-03 LAB — I-STAT CHEM 8, ED
BUN: 21 mg/dL (ref 8–23)
Calcium, Ion: 1.13 mmol/L — ABNORMAL LOW (ref 1.15–1.40)
Chloride: 126 mmol/L — ABNORMAL HIGH (ref 98–111)
Creatinine, Ser: 0.9 mg/dL (ref 0.44–1.00)
Glucose, Bld: 108 mg/dL — ABNORMAL HIGH (ref 70–99)
HCT: 38 % (ref 36.0–46.0)
Hemoglobin: 12.9 g/dL (ref 12.0–15.0)
Potassium: 2.7 mmol/L — CL (ref 3.5–5.1)
Sodium: 162 mmol/L (ref 135–145)
TCO2: 25 mmol/L (ref 22–32)

## 2019-11-03 LAB — D-DIMER, QUANTITATIVE: D-Dimer, Quant: 3.42 ug/mL-FEU — ABNORMAL HIGH (ref 0.00–0.50)

## 2019-11-03 LAB — URINALYSIS, ROUTINE W REFLEX MICROSCOPIC
Bilirubin Urine: NEGATIVE
Glucose, UA: NEGATIVE mg/dL
Hgb urine dipstick: NEGATIVE
Ketones, ur: NEGATIVE mg/dL
Leukocytes,Ua: NEGATIVE
Nitrite: NEGATIVE
Protein, ur: 30 mg/dL — AB
Specific Gravity, Urine: 1.014 (ref 1.005–1.030)
pH: 5 (ref 5.0–8.0)

## 2019-11-03 LAB — SODIUM, URINE, RANDOM: Sodium, Ur: 17 mmol/L

## 2019-11-03 LAB — TRIGLYCERIDES: Triglycerides: 92 mg/dL (ref <150)

## 2019-11-03 LAB — C-REACTIVE PROTEIN: CRP: 22.7 mg/dL — ABNORMAL HIGH (ref <1.0)

## 2019-11-03 LAB — LACTIC ACID, PLASMA: Lactic Acid, Venous: 3.1 mmol/L (ref 0.5–1.9)

## 2019-11-03 LAB — CBG MONITORING, ED: Glucose-Capillary: 94 mg/dL (ref 70–99)

## 2019-11-03 LAB — FERRITIN: Ferritin: 557 ng/mL — ABNORMAL HIGH (ref 11–307)

## 2019-11-03 LAB — CREATININE, URINE, RANDOM: Creatinine, Urine: 76.89 mg/dL

## 2019-11-03 LAB — POC SARS CORONAVIRUS 2 AG -  ED: SARS Coronavirus 2 Ag: NEGATIVE

## 2019-11-03 LAB — FIBRINOGEN: Fibrinogen: 797 mg/dL — ABNORMAL HIGH (ref 210–475)

## 2019-11-03 MED ORDER — ACETAMINOPHEN 650 MG RE SUPP
650.0000 mg | Freq: Four times a day (QID) | RECTAL | Status: DC | PRN
  Administered 2019-11-05: 650 mg via RECTAL
  Filled 2019-11-03: qty 1

## 2019-11-03 MED ORDER — SODIUM CHLORIDE 0.9 % IV SOLN
2.0000 g | INTRAVENOUS | Status: DC
  Administered 2019-11-03 – 2019-11-05 (×3): 2 g via INTRAVENOUS
  Filled 2019-11-03: qty 20
  Filled 2019-11-03: qty 2
  Filled 2019-11-03 (×2): qty 20

## 2019-11-03 MED ORDER — SODIUM CHLORIDE 0.9 % IV BOLUS
500.0000 mL | Freq: Once | INTRAVENOUS | Status: AC
  Administered 2019-11-03: 500 mL via INTRAVENOUS

## 2019-11-03 MED ORDER — DEXTROSE 5 % IV SOLN: INTRAVENOUS | Status: AC

## 2019-11-03 MED ORDER — ONDANSETRON HCL 4 MG/2ML IJ SOLN: 4.0000 mg | Freq: Four times a day (QID) | INTRAMUSCULAR | Status: DC | PRN

## 2019-11-03 MED ORDER — RIVASTIGMINE TARTRATE 1.5 MG PO CAPS
6.0000 mg | ORAL_CAPSULE | Freq: Two times a day (BID) | ORAL | Status: DC
  Filled 2019-11-03 (×7): qty 4

## 2019-11-03 MED ORDER — PRAVASTATIN SODIUM 40 MG PO TABS: 40.0000 mg | ORAL_TABLET | Freq: Every day | ORAL | Status: DC

## 2019-11-03 MED ORDER — CLONIDINE HCL 0.3 MG/24HR TD PTWK
0.3000 mg | MEDICATED_PATCH | TRANSDERMAL | Status: DC
  Administered 2019-11-04: 0.3 mg via TRANSDERMAL
  Filled 2019-11-03: qty 1

## 2019-11-03 MED ORDER — SODIUM CHLORIDE 0.9 % IV SOLN
500.0000 mg | INTRAVENOUS | Status: DC
  Administered 2019-11-03 – 2019-11-05 (×3): 500 mg via INTRAVENOUS
  Filled 2019-11-03 (×4): qty 500

## 2019-11-03 MED ORDER — ONDANSETRON HCL 4 MG PO TABS: 4.0000 mg | ORAL_TABLET | Freq: Four times a day (QID) | ORAL | Status: DC | PRN

## 2019-11-03 MED ORDER — LEVETIRACETAM IN NACL 500 MG/100ML IV SOLN
500.0000 mg | Freq: Two times a day (BID) | INTRAVENOUS | Status: DC
  Administered 2019-11-04 – 2019-11-06 (×5): 500 mg via INTRAVENOUS
  Filled 2019-11-03 (×6): qty 100

## 2019-11-03 MED ORDER — ASPIRIN EC 81 MG PO TBEC
81.0000 mg | DELAYED_RELEASE_TABLET | Freq: Every day | ORAL | Status: DC
  Filled 2019-11-03: qty 1

## 2019-11-03 MED ORDER — LOSARTAN POTASSIUM 50 MG PO TABS: 100.0000 mg | ORAL_TABLET | Freq: Every day | ORAL | Status: DC

## 2019-11-03 MED ORDER — SODIUM CHLORIDE 0.9 % IV SOLN
2.0000 g | Freq: Once | INTRAVENOUS | Status: AC
  Administered 2019-11-03: 2 g via INTRAVENOUS
  Filled 2019-11-03: qty 2

## 2019-11-03 MED ORDER — ACETAMINOPHEN 650 MG RE SUPP
650.0000 mg | Freq: Once | RECTAL | Status: AC
  Administered 2019-11-03: 650 mg via RECTAL
  Filled 2019-11-03: qty 1

## 2019-11-03 MED ORDER — VITAMIN B-12 1000 MCG PO TABS: 1000.0000 ug | ORAL_TABLET | Freq: Every day | ORAL | Status: DC

## 2019-11-03 MED ORDER — VANCOMYCIN HCL 1500 MG/300ML IV SOLN
1500.0000 mg | Freq: Once | INTRAVENOUS | Status: DC
  Administered 2019-11-03: 1500 mg via INTRAVENOUS
  Filled 2019-11-03: qty 300

## 2019-11-03 MED ORDER — ACETAMINOPHEN 325 MG PO TABS: 650.0000 mg | ORAL_TABLET | Freq: Four times a day (QID) | ORAL | Status: DC | PRN

## 2019-11-03 MED ORDER — MEMANTINE HCL ER 28 MG PO CP24
28.0000 mg | ORAL_CAPSULE | Freq: Every day | ORAL | Status: DC
  Filled 2019-11-03 (×2): qty 1

## 2019-11-03 MED ORDER — OMEGA-3-ACID ETHYL ESTERS 1 G PO CAPS
1.0000 g | ORAL_CAPSULE | Freq: Every day | ORAL | Status: DC
  Filled 2019-11-03: qty 1

## 2019-11-03 MED ORDER — ENOXAPARIN SODIUM 40 MG/0.4ML ~~LOC~~ SOLN
40.0000 mg | SUBCUTANEOUS | Status: DC
  Administered 2019-11-03 – 2019-11-04 (×2): 40 mg via SUBCUTANEOUS
  Filled 2019-11-03 (×2): qty 0.4

## 2019-11-03 MED ORDER — AMLODIPINE BESYLATE 10 MG PO TABS: 10.0000 mg | ORAL_TABLET | Freq: Every day | ORAL | Status: DC

## 2019-11-03 MED ORDER — VANCOMYCIN HCL IN DEXTROSE 1-5 GM/200ML-% IV SOLN: 1000.0000 mg | INTRAVENOUS | Status: DC

## 2019-11-03 NOTE — H&P (Addendum)
History and Physical    ABRIELLA FILKINS DGL:[id number] DOB: [birth year] DOA: 11/03/2019  PCP: Hennie Duos, MD  Patient coming from: From [place] from nursing facility.  History obtained from ER physician as patient has advanced dementia unable to reach patient's daughter.  Chief Complaint: Shortness of breath fever.  HPI: Melissa Jarvis is a 79 y.o. female with history of advanced dementia, hypertension, hyperlipidemia history of seizures was brought to the ER after patient was found to be suddenly short of breath at around 11:00 this morning.  As per the daughter who spoke to the ER physician patient has been doing poorly over the last few weeks with poor intake and a PICC line was started and IV fluids was initiated.  Plan was to have feeding tube placed for the poor nutrition.  Given the acute changes patient was brought to the ER.  Prior to coming as per the report patient had a rapid Covid test done at the facility which was negative.  Patient also was given Tylenol before coming.  ED Course: In the ER patient appears cachectic fever 101.1 F with labs showing sodium 159 potassium 3.4 creatinine 0.8 high sensitive 20 was 74 Covid test came back negative lactic acid was 3.1 ferritin 527 CRP 22.7 WBC count was 23.1 platelets 100 patient was started on empiric antibiotics for community-acquired pneumonia after blood cultures were obtained.  Patient also was started on IV fluids hypernatremia.  Review of Systems: As per HPI, rest all negative.   Past Medical History:  Diagnosis Date  . Alzheimer's disease ([place])   . Chronic constipation   . Depressed   . Failure to thrive in adult   . HCAP (healthcare-associated pneumonia)   . Hyperlipidemia   . Hypernatremia   . Hypertension   . Hypokalemia   . Low back pain   . Migraines   . Osteoarthritis   . Osteoporosis   . PBA (pseudobulbar affect)   . Post-ictal state ([place])   . Seizures ([place])   . Urinary tract infection with  hematuria   . Vitamin B12 deficiency     Past Surgical History:  Procedure Laterality Date  . ABDOMINAL HYSTERECTOMY    . CHOLECYSTECTOMY    . KNEE SURGERY     total     reports that she has quit smoking. She has never used smokeless tobacco. She reports that she does not drink alcohol or use drugs.  Allergies  Allergen Reactions  . Penicillins Other (See Comments)    Tolerated Ceftriaxone 04/2018 Other reaction(s): UNKNOWN REACTION      Family History  Problem Relation Age of Onset  . Hypertension Mother   . Peripheral vascular disease Mother        Bilateral leg amputations  . Alzheimer's disease Father   . Stroke Brother   . Heart attack Brother     Prior to Admission medications   Medication Sig Start Date End Date Taking? Authorizing Provider  amLODipine (NORVASC) 10 MG tablet Take 10 mg by mouth daily.    [provider]  aspirin (ASPIRIN EC) 81 MG EC tablet Take 81 mg by mouth daily. Swallow whole.     [provider]  bisacodyl (DULCOLAX) 10 MG suppository Place 10 mg rectally daily as needed for moderate constipation (if not relieved by MOM).     [provider]  calcium carbonate (OS-CAL) 1250 (500 Ca) MG chewable tablet Chew 1 tablet by mouth daily.    [provider]  Cholecalciferol (D3-1000) 1000 units tablet Take 1,000 Units by mouth daily.    [provider]  cloNIDine (CATAPRES - DOSED IN MG/24 HR) 0.3 mg/24hr patch Place 0.3 mg onto the skin once a week.    [provider]  Dextromethorphan-quiNIDine (NUEDEXTA) 20-10 MG CAPS Take 1 capsule by mouth daily.     [provider]  Ensure (ENSURE) Take 237 mLs by mouth 2 (two) times daily between meals.    [provider]  levETIRAcetam (KEPPRA) 100 MG/ML solution Take 5 mLs (500 mg total) by mouth 2 (two) times daily. 02/14/18   Shirley, [place], DO  losartan (COZAAR) 50 MG tablet Take 100 mg by mouth at bedtime.     [provider]   Magnesium Hydroxide (MILK OF MAGNESIA PO) Take 30 mLs by mouth daily as needed (if no BM in 3 days).     [provider]  memantine (NAMENDA XR) 28 MG CP24 24 hr capsule Take 28 mg by mouth at bedtime.     [provider]  Nutritional Supplements (NUTRITIONAL SUPPLEMENT PO) Take 1 each by mouth 3 (three) times daily. Manufacturing engineer, Historical, MD  Omega-3 Fatty Acids (FISH OIL PO) Take 5 mLs by mouth daily. 1,600 MG/5 ML LIQUID- take 5 ml po daily as supplement     [provider]  pravastatin (PRAVACHOL) 40 MG tablet Take 40 mg by mouth daily.    [provider]  rivastigmine (EXELON) 6 MG capsule Take 6 mg by mouth 2 (two) times daily.     [provider]  Sodium Phosphates (RA SALINE ENEMA RE) If not relieved by Biscodyl suppository, give disposable Saline Enema rectally X 1 dose/24 hrs as needed (Do not use constipation standing orders for residents with renal failure/CFR less than 30. Contact MD for orders)(Physician Or    [provider]  vitamin B-12 (CYANOCOBALAMIN) 1000 MCG tablet Take 1,000 mcg by mouth daily.    [provider]    Physical Exam: Constitutional: Moderately built and nourished. Vitals:   11/03/19 2015 11/03/19 2030 11/03/19 2045 11/03/19 2100  BP:   (!) 195/100 (!) 189/81  Pulse:  99    Resp: (!) 27 (!) 25 20 (!) 26  Temp:      TempSrc:      SpO2:  98%     Eyes: Anicteric no pallor. ENMT: No discharge from the ears eyes nose or mouth. Neck: No mass felt.  No neck rigidity. Respiratory: No rhonchi or crepitations. Cardiovascular: S1-S2 heard. Abdomen: Soft nontender bowel sounds present. Musculoskeletal: No edema.  No joint effusion. Skin: No rash. Neurologic: Patient is minimally responsive and as per the report this is the baseline. Psychiatric: Minimally responsive.   Labs on Admission: I have personally reviewed following labs and imaging studies  CBC: Recent Labs  Lab  11/03/19 2016 11/03/19 2019  WBC  --  23.1*  NEUTROABS  --  19.6*  HGB 12.9 14.1  HCT 38.0 45.9  MCV  --  101.1*  PLT  --  100*   Basic Metabolic Panel: Recent Labs  Lab 11/03/19 2001 11/03/19 2016  NA 159* 162*  K 3.4* 2.7*  CL 127* 126*  CO2 20*  --   GLUCOSE 106* 108*  BUN 21 21  CREATININE 0.88 0.90  CALCIUM 8.1*  --    GFR: Estimated Creatinine Clearance: 48.2 mL/min (by C-G formula based on SCr of 0.9 mg/dL). Liver Function Tests: Recent Labs  Lab 11/03/19 2001  AST 22  ALT 11  ALKPHOS 96  BILITOT 2.4*  PROT 5.3*  ALBUMIN 2.0*   No results for input(s): LIPASE, AMYLASE in the last 168 hours. No results for input(s): AMMONIA in the last 168 hours. Coagulation Profile: No results for input(s): INR, PROTIME in the last 168 hours. Cardiac Enzymes: No results for input(s): CKTOTAL, CKMB, CKMBINDEX, TROPONINI in the last 168 hours. BNP (last 3 results) No results for input(s): PROBNP in the last 8760 hours. HbA1C: No results for input(s): HGBA1C in the last 72 hours. CBG: No results for input(s): GLUCAP in the last 168 hours. Lipid Profile: Recent Labs    11/03/19 2001  TRIG 92   Thyroid Function Tests: No results for input(s): TSH, T4TOTAL, FREET4, T3FREE, THYROIDAB in the last 72 hours. Anemia Panel: No results for input(s): VITAMINB12, FOLATE, FERRITIN, TIBC, IRON, RETICCTPCT in the last 72 hours. Urine analysis:    Component Value Date/Time   COLORURINE YELLOW 12/27/2018 0557   APPEARANCEUR HAZY (A) 12/27/2018 0557   LABSPEC 1.042 (H) 12/27/2018 0557   PHURINE 5.0 12/27/2018 0557   GLUCOSEU NEGATIVE 12/27/2018 0557   HGBUR NEGATIVE 12/27/2018 0557   BILIRUBINUR NEGATIVE 12/27/2018 0557   KETONESUR NEGATIVE 12/27/2018 0557   PROTEINUR NEGATIVE 12/27/2018 0557   UROBILINOGEN 1.0 12/16/2014 1330   NITRITE NEGATIVE 12/27/2018 0557   LEUKOCYTESUR SMALL (A) 12/27/2018 0557   Sepsis Labs: @LABRCNTIP (procalcitonin:4,lacticidven:4) ) Recent  Results (from the past 240 hour(s))  Respiratory Panel by RT PCR (Flu A&B, Covid) - Nasopharyngeal Swab     Status: None   Collection Time: 11/03/19  7:43 PM   Specimen: Nasopharyngeal Swab  Result Value Ref Range Status   SARS Coronavirus 2 by RT PCR NEGATIVE NEGATIVE Final    Comment: (NOTE) SARS-CoV-2 target nucleic acids are NOT DETECTED. The SARS-CoV-2 RNA is generally detectable in upper respiratoy specimens during the acute phase of infection. The lowest concentration of SARS-CoV-2 viral copies this assay can detect is 131 copies/mL. A negative result does not preclude SARS-Cov-2 infection and should not be used as the sole basis for treatment or other patient management decisions. A negative result may occur with  improper specimen collection/handling, submission of specimen other than nasopharyngeal swab, presence of viral mutation(s) within the areas targeted by this assay, and inadequate number of viral copies (<131 copies/mL). A negative result must be combined with clinical observations, patient history, and epidemiological information. The expected result is Negative. Fact Sheet for Patients:  11/05/19 Fact Sheet for Healthcare Providers:  https://www.moore.com/ This test is not yet ap proved or cleared by the https://www.young.biz/ FDA and  has been authorized for detection and/or diagnosis of SARS-CoV-2 by FDA under an Emergency Use Authorization (EUA). This EUA will remain  in effect (meaning this test can be used) for the duration of the COVID-19 declaration under Section 564(b)(1) of the Act, 21 U.S.C. section 360bbb-3(b)(1), unless the authorization is terminated or revoked sooner.    Influenza A by PCR NEGATIVE NEGATIVE Final   Influenza B by PCR NEGATIVE NEGATIVE Final    Comment: (NOTE) The Xpert Xpress SARS-CoV-2/FLU/RSV assay is intended as an aid in  the diagnosis of influenza from Nasopharyngeal swab specimens  and  should not be used as a sole basis for treatment. Nasal washings and  aspirates are unacceptable for Xpert Xpress SARS-CoV-2/FLU/RSV  testing. Fact Sheet for Patients: [place] Fact Sheet for Healthcare Providers: https://www.moore.com/ This test is not yet approved or cleared by the https://www.young.biz/ FDA and  has been  authorized for detection and/or diagnosis of SARS-CoV-2 by  FDA under an Emergency Use Authorization (EUA). This EUA will remain  in effect (meaning this test can be used) for the duration of the  Covid-19 declaration under Section 564(b)(1) of the Act, 21  U.S.C. section 360bbb-3(b)(1), unless the authorization is  terminated or revoked. Performed at [facility], [address], [place], [place] [address]      Radiological Exams on Admission: DG Chest Port 1 View  Result Date: 11/03/2019 CLINICAL DATA:  Pneumonia EXAM: PORTABLE CHEST 1 VIEW COMPARISON:  April 22, 2018 FINDINGS: There is consolidation within the right mid and right lower lung zones. There is no pneumothorax. No large pleural effusion. There is mild elevation of the left hemidiaphragm. The heart size is normal. Dense aortic calcifications are noted. There is no acute osseous abnormality. IMPRESSION: Right-sided pneumonia as above. Follow-up to radiologic resolution is recommended. Aortic Atherosclerosis (ICD10-I70.0). Electronically Signed   By: Katherine Mantle M.D.   On: 11/03/2019 20:14    EKG: Independently reviewed.  Normal sinus rhythm with nonspecific sensory changes.  Assessment/Plan Principal Problem:   Acute respiratory failure with hypoxia (HCC) Active Problems:   Dementia (HCC)   Hypernatremia   Hypertension   Sepsis (HCC)    1. Acute respiratory failure with sepsis secondary to community-acquired pneumonia for which patient is placed on empiric antibiotics.  We will get a speech therapy evaluation for swallow.  Follow  cultures.  Continue hydration. 2. Severe Hypernatremia and mild hypokalemia-likely from dehydration and poor oral intake for which I have placed patient on D5W.  Closely follow metabolic panel.  Correct potassium. 3. Uncontrolled hypertension for which we will keep patient on as needed IV hydralazine and continue home medications when patient can swallow.  Patient is on clonidine patch. 4. History of seizures on Keppra which I had was IV for now until patient passes swallow.  On my exam patient opens her eyes on calling her name but I do not think patient is having any active seizures.  Will check EEG. 5. Thrombocytopenia appears to be chronic. 6. Severe protein calorie malnutrition will need nutrition input.  Note that patient plan is planned to have a PEG tube placed eventually. 7. Dementia on Namenda. 8. Hyperlipidemia on statins.  Since patient has acute respiratory failure with septic picture with pneumonia will need inpatient status.  Speech therapy consult has been requested for swallow evaluation.  Family also was requesting possible PEG tube placement if patient cannot swallow.   DVT prophylaxis: Lovenox.  Note that patient has thrombocytopenia. Code Status: Full code as confirmed by ER physician with patient's daughter. Family Communication: ER physician discussed with patient daughter.  I was unable to reach. Disposition Plan: To be determined. Consults called: Speech therapy. Admission status: Inpatient.   Eduard Clos MD [facility] Pager [phone].  If 7PM-7AM, please contact night-coverage [email or web address] Password [facility]  11/03/2019, 9:28 PM

## 2019-11-03 NOTE — ED Triage Notes (Signed)
Pt bib ems from [facility] with reports of breathing difficulty onset 1100 today with oxygen saturations in the mid 60's. Pt placed on 2L [place] at that time with improvement into the 90's. Pt febrile to 101 earlier today. Given tylenol PTA. 97.8 with EMS. Mental status is at baseline per EMS. Rapid covid at [facility] was negative. VSS with ems.

## 2019-11-03 NOTE — ED Provider Notes (Signed)
[facility] EMERGENCY DEPARTMENT Provider Note   CSN: [id number] Arrival date & time: 11/03/19  1812     History No chief complaint on file.   Melissa Jarvis is a 79 y.o. female.  Patient with history of Alzheimer's dementia, long-term nursing facility resident, history of pneumonia --presents to the emergency department with respiratory distress, hypoxia, altered mental status.  Level 5 caveat due to dementia.  Patient with worsening shortness of breath and breathing difficulty today at around 11 AM.  Did that her oxygen saturations were in the 60s.  Patient was placed on 2 L nasal cannula with improvement of her oxygen saturations.  Reported fever 101 earlier today.  She was given Tylenol.  Patient is a full code.  Reported that her neighbor had Covid, however patient has not had a positive test.  Chest x-ray today showed a right basilar pneumonia.        Past Medical History:  Diagnosis Date  . Alzheimer's disease (HCC)   . Chronic constipation   . Depressed   . Failure to thrive in adult   . HCAP (healthcare-associated pneumonia)   . Hyperlipidemia   . Hypernatremia   . Hypertension   . Hypokalemia   . Low back pain   . Migraines   . Osteoarthritis   . Osteoporosis   . PBA (pseudobulbar affect)   . Post-ictal state (HCC)   . Seizures (HCC)   . Urinary tract infection with hematuria   . Vitamin B12 deficiency     Patient Active Problem List   Diagnosis Date Noted  . Real time reverse transcriptase PCR positive for COVID-19 virus 06/02/2019  . Malnutrition of moderate degree 12/28/2018  . Palliative care by specialist   . Goals of care, counseling/discussion   . Emesis 12/27/2018  . Enterococcus UTI 04/29/2018  . CAP (community acquired pneumonia) 04/25/2018  . Multifocal pneumonia 04/22/2018  . Post-ictal state (HCC) 02/18/2018  . Urinary tract infection with hematuria   . Seizure (HCC) 02/11/2018  . Chronic constipation 02/09/2018  .  Dementia (HCC) 12/16/2017  . Failure to thrive in adult 12/16/2017  . Hypernatremia 12/16/2017  . Hypokalemia 12/16/2017  . Hypertension 12/16/2017  . 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 Hyperlipidemia 12/16/2017  . Osteoporosis 12/16/2017  . Vitamin B12 deficiency 12/16/2017  . Depression 12/16/2017    Past Surgical History:  Procedure Laterality Date  . ABDOMINAL HYSTERECTOMY    . CHOLECYSTECTOMY    . KNEE SURGERY     total     OB History    Gravida  2   Para  2   Term  2   Preterm      AB      Living        SAB      TAB      Ectopic      Multiple      Live Births              Family History  Problem Relation Age of Onset  . Hypertension Mother   . Peripheral vascular disease Mother        Bilateral leg amputations  . Alzheimer's disease Father   . Stroke Brother   . Heart attack Brother     Social History   Tobacco Use  . Smoking status: Former Games developer  . Smokeless tobacco: Never Used  Substance Use Topics  . Alcohol use: No  . Drug use: No    Home Medications Prior  to Admission medications   Medication Sig Start Date End Date Taking? Authorizing Provider  amLODipine (NORVASC) 10 MG tablet Take 10 mg by mouth daily.    [provider]  aspirin (ASPIRIN EC) 81 MG EC tablet Take 81 mg by mouth daily. Swallow whole.     [provider]  bisacodyl (DULCOLAX) 10 MG suppository Place 10 mg rectally daily as needed for moderate constipation (if not relieved by MOM).     [provider]  calcium carbonate (OS-CAL) 1250 (500 Ca) MG chewable tablet Chew 1 tablet by mouth daily.    [provider]  Cholecalciferol (D3-1000) 1000 units tablet Take 1,000 Units by mouth daily.    [provider]  cloNIDine (CATAPRES - DOSED IN MG/24 HR) 0.3 mg/24hr patch Place 0.3 mg onto the skin once a week.    [provider]  Dextromethorphan-quiNIDine (NUEDEXTA) 20-10 MG CAPS Take 1 capsule by mouth daily.      [provider]  Ensure (ENSURE) Take 237 mLs by mouth 2 (two) times daily between meals.    [provider]  levETIRAcetam (KEPPRA) 100 MG/ML solution Take 5 mLs (500 mg total) by mouth 2 (two) times daily. 02/14/18   Shirley, [place], DO  losartan (COZAAR) 50 MG tablet Take 100 mg by mouth at bedtime.     [provider]  Magnesium Hydroxide (MILK OF MAGNESIA PO) Take 30 mLs by mouth daily as needed (if no BM in 3 days).     [provider]  memantine (NAMENDA XR) 28 MG CP24 24 hr capsule Take 28 mg by mouth at bedtime.     [provider]  Nutritional Supplements (NUTRITIONAL SUPPLEMENT PO) Take 1 each by mouth 3 (three) times daily. Manufacturing engineerMagic Cup    [provider]  Omega-3 Fatty Acids (FISH OIL PO) Take 5 mLs by mouth daily. 1,600 MG/5 ML LIQUID- take 5 ml po daily as supplement     [provider]  pravastatin (PRAVACHOL) 40 MG tablet Take 40 mg by mouth daily.    [provider]  rivastigmine (EXELON) 6 MG capsule Take 6 mg by mouth 2 (two) times daily.     [provider]  Sodium Phosphates (RA SALINE ENEMA RE) If not relieved by Biscodyl suppository, give disposable Saline Enema rectally X 1 dose/24 hrs as needed (Do not use constipation standing orders for residents with renal failure/CFR less than 30. Contact MD for orders)(Physician Or    [provider]  vitamin B-12 (CYANOCOBALAMIN) 1000 MCG tablet Take 1,000 mcg by mouth daily.    [provider]    Allergies    Penicillins  Review of Systems   Review of Systems  Unable to perform ROS: Dementia    Physical Exam Updated Vital Signs BP (!) 146/88 (BP Location: Right Arm)   Pulse (!) 108   Temp 98.3 F (36.8 C) (Oral)   Resp 14   Physical Exam Vitals and nursing note reviewed.  Constitutional:      Appearance: She is well-developed.  HENT:     Head: Normocephalic and atraumatic.     Nose: Nose normal.     Mouth/Throat:      Mouth: Mucous membranes are dry.  Eyes:     General:        Right eye: No discharge.        Left eye: No discharge.     Conjunctiva/sclera: Conjunctivae normal.  Cardiovascular:     Rate and Rhythm: Regular  rhythm. Tachycardia present.     Heart sounds: Normal heart sounds.  Pulmonary:     Comments: Patient with neck extended and snoring respirations.  No apparent increased work of breathing.  Oxygen saturation 98-100 on oxygen. Abdominal:     Palpations: Abdomen is soft.     Tenderness: There is no guarding.  Musculoskeletal:        General: No swelling.     Cervical back: Normal range of motion and neck supple.     Comments: No lower extremity swelling.  Skin:    General: Skin is warm and dry.  Neurological:     Mental Status: She is alert.     Comments: Patient with dementia.  She is nonverbal.  She is sleeping soundly during time of exam and is not interactive.  Neuro exam limited due to this.     ED Results / Procedures / Treatments   Labs (all labs ordered are listed, but only abnormal results are displayed) Labs Reviewed  LACTIC ACID, PLASMA - Abnormal; Notable for the following components:      Result Value   Lactic Acid, Venous 3.1 (*)    All other components within normal limits  CBC WITH DIFFERENTIAL/PLATELET - Abnormal; Notable for the following components:   WBC 23.1 (*)    MCV 101.1 (*)    Platelets 100 (*)    Neutro Abs 19.6 (*)    Monocytes Absolute 1.2 (*)    Abs Immature Granulocytes 1.08 (*)    All other components within normal limits  D-DIMER, QUANTITATIVE (NOT AT [facility]) - Abnormal; Notable for the following components:   D-Dimer, Quant 3.42 (*)    All other components within normal limits  FIBRINOGEN - Abnormal; Notable for the following components:   Fibrinogen 797 (*)    All other components within normal limits  COMPREHENSIVE METABOLIC PANEL - Abnormal; Notable for the following components:   Sodium 159 (*)    Potassium 3.4 (*)    Chloride  127 (*)    CO2 20 (*)    Glucose, Bld 106 (*)    Calcium 8.1 (*)    Total Protein 5.3 (*)    Albumin 2.0 (*)    Total Bilirubin 2.4 (*)    All other components within normal limits  LACTATE DEHYDROGENASE - Abnormal; Notable for the following components:   LDH 341 (*)    All other components within normal limits  I-STAT CHEM 8, ED - Abnormal; Notable for the following components:   Sodium 162 (*)    Potassium 2.7 (*)    Chloride 126 (*)    Glucose, Bld 108 (*)    Calcium, Ion 1.13 (*)    All other components within normal limits  TROPONIN I (HIGH SENSITIVITY) - Abnormal; Notable for the following components:   Troponin I (High Sensitivity) 74 (*)    All other components within normal limits  RESPIRATORY PANEL BY RT PCR (FLU A&B, COVID)  CULTURE, BLOOD (ROUTINE X 2)  CULTURE, BLOOD (ROUTINE X 2)  URINE CULTURE  TRIGLYCERIDES  LACTIC ACID, PLASMA  SODIUM, URINE, RANDOM  CREATININE, URINE, RANDOM  C-REACTIVE PROTEIN  FERRITIN  URINALYSIS, ROUTINE W REFLEX MICROSCOPIC  POC SARS CORONAVIRUS 2 AG -  ED  TROPONIN I (HIGH SENSITIVITY)    EKG EKG Interpretation  Date/Time:  Sunday November 03 2019 18:29:32 EST Ventricular Rate:  103 PR Interval:  130 QRS Duration: 74 QT Interval:  280 QTC Calculation: 366 R Axis:   27 Text Interpretation:  Unusual P axis and short PR, probable junctional tachycardia Marked ST abnormality, possible inferior subendocardial injury Abnormal ECG new ischemic changes from 3/20 Confirmed by Meridee Score [phone]) on 11/03/2019 6:46:38 PM   Radiology DG Chest Port 1 View  Result Date: 11/03/2019 CLINICAL DATA:  Pneumonia EXAM: PORTABLE CHEST 1 VIEW COMPARISON:  April 22, 2018 FINDINGS: There is consolidation within the right mid and right lower lung zones. There is no pneumothorax. No large pleural effusion. There is mild elevation of the left hemidiaphragm. The heart size is normal. Dense aortic calcifications are noted. There is no acute osseous  abnormality. IMPRESSION: Right-sided pneumonia as above. Follow-up to radiologic resolution is recommended. Aortic Atherosclerosis (ICD10-I70.0). Electronically Signed   By: Katherine Mantle M.D.   On: 11/03/2019 20:14    Procedures Procedures (including critical care time)  Medications Ordered in ED Medications  vancomycin (VANCOREADY) IVPB 1500 mg/300 mL (has no administration in time range)    Followed by  vancomycin (VANCOCIN) IVPB 1000 mg/200 mL premix (has no administration in time range)  aspirin EC tablet 81 mg (has no administration in time range)  amLODipine (NORVASC) tablet 10 mg (has no administration in time range)  cloNIDine (CATAPRES - Dosed in mg/24 hr) patch 0.3 mg (has no administration in time range)  losartan (COZAAR) tablet 100 mg (has no administration in time range)  pravastatin (PRAVACHOL) tablet 40 mg (has no administration in time range)  memantine (NAMENDA XR) 24 hr capsule 28 mg (has no administration in time range)  rivastigmine (EXELON) capsule 6 mg (has no administration in time range)  vitamin B-12 (CYANOCOBALAMIN) tablet 1,000 mcg (has no administration in time range)  omega-3 acid ethyl esters (LOVAZA) capsule 1 g (has no administration in time range)  acetaminophen (TYLENOL) tablet 650 mg (has no administration in time range)    Or  acetaminophen (TYLENOL) suppository 650 mg (has no administration in time range)  ondansetron (ZOFRAN) tablet 4 mg (has no administration in time range)    Or  ondansetron (ZOFRAN) injection 4 mg (has no administration in time range)  cefTRIAXone (ROCEPHIN) 2 g in sodium chloride 0.9 % 100 mL IVPB (has no administration in time range)  azithromycin (ZITHROMAX) 500 mg in sodium chloride 0.9 % 250 mL IVPB (has no administration in time range)  enoxaparin (LOVENOX) injection 40 mg (has no administration in time range)  dextrose 5 % solution (has no administration in time range)  levETIRAcetam (KEPPRA) IVPB 500 mg/100 mL  premix (has no administration in time range)  sodium chloride 0.9 % bolus 500 mL (0 mLs Intravenous Stopped 11/03/19 2008)  acetaminophen (TYLENOL) suppository 650 mg (650 mg Rectal Given 11/03/19 1926)  ceFEPIme (MAXIPIME) 2 g in sodium chloride 0.9 % 100 mL IVPB (0 g Intravenous Stopped 11/03/19 2100)    ED Course  I have reviewed the triage vital signs and the nursing notes.  Pertinent labs & imaging results that were available during my care of the patient were reviewed by me and considered in my medical decision making (see chart for details).  Patient seen and examined. Work-up initiated. Medications ordered.  Sepsis labs and coronavirus labs ordered.  Will check rapid Covid test and x-ray.  If negative, will need PCR.  If chest x-ray demonstrates pneumonia, may initiate antibiotics.  Vital signs reviewed and are as follows: BP (!) 146/88 (BP Location: Right Arm)   Pulse (!) 108   Temp 98.3 F (36.8 C) (Oral)   Resp 14    9:25 PM I  discussed the patient's current findings and plan for treatment with her daughter, Carmela by telephone.  She confirmed the patient is currently full code.  PICC line was placed for IV hydration due to poor oral intake over the past several weeks.  She was due to have a feeding tube placed tomorrow.  Discussed that this will likely not happen as planned.  Patient with severe hypernatremia, likely acute on chronic, continue IV hydration.  Normal kidney function.  Lactate of 3 with normal blood pressure.  Continue hydration.  Vanco/cefepime have been ordered for initial treatment of pneumonia.  I discussed case with Dr. Hal Hope of [facility] hospitalist who will see patient.  Patient will likely benefit from palliative care consult in the hospital.    Clinical Course as of Nov 03 2123  Sun Nov 03, 6867  2782 79 year old female transfer from nursing home for decreased sats.  At baseline patient is noncommunicative.  She is overall chronically ill-appearing.   Sats better on nasal cannula.  Getting blood work EKG Covid testing and labs.  Chest x-ray appears to have possibly right lower lobe infiltrate.  Awaiting radiology reading.  Will need admission.   [MB]    Clinical Course User Index [MB] Hayden Rasmussen, MD   CRITICAL CARE Performed by: Carlisle Cater PA-C Total critical care time: 40 minutes Critical care time was exclusive of separately billable procedures and treating other patients. Critical care was necessary to treat or prevent imminent or life-threatening deterioration. Critical care was time spent personally by me on the following activities: development of treatment plan with patient and/or surrogate as well as nursing, discussions with consultants, evaluation of patient's response to treatment, examination of patient, obtaining history from patient or surrogate, ordering and performing treatments and interventions, ordering and review of laboratory studies, ordering and review of radiographic studies, pulse oximetry and re-evaluation of patient's condition.   MDM Rules/Calculators/A&P                      Admit, severe sepsis 2/2 pneumonia with severe electrolyte derangement.    Final Clinical Impression(s) / ED Diagnoses Final diagnoses:  Community acquired pneumonia of right lower lobe of lung  Sepsis with acute organ dysfunction without septic shock, due to unspecified organism, unspecified type ([place])  Hypernatremia  Hypokalemia    Rx / DC Orders ED Discharge Orders    None       Carlisle Cater, Hershal Coria 11/03/19 2224    Hayden Rasmussen, MD 11/04/19 1133

## 2019-11-03 NOTE — Progress Notes (Signed)
Pharmacy Antibiotic Note  Melissa Jarvis is a 79 y.o. female admitted on 11/03/2019 with pneumonia.  Pharmacy has been consulted for Vancomycin dosing.  Plan:  - Loading dose of Vancomycin 1500mg  IV x 1 dose  - Followed by Vancomycin 1000mg  IV q24h - Est Calc AUC 483 - Monitor patient's renal fxn     Temp (24hrs), Avg:99.7 F (37.6 C), Min:98.3 F (36.8 C), Max:101.1 F (38.4 C)  Recent Labs  Lab 11/03/19 2016  CREATININE 0.90    Estimated Creatinine Clearance: 48.2 mL/min (by C-G formula based on SCr of 0.9 mg/dL).    Allergies  Allergen Reactions  . Penicillins Other (See Comments)    Tolerated Ceftriaxone 04/2018 Other reaction(s): UNKNOWN REACTION      Antimicrobials this admission: 1/17 Cefepime >>  1/17 Vancomycin >>   Dose adjustments this admission:   Microbiology results: 1/17 BCx: Pending   Thank you for allowing pharmacy to be a part of this patient's care.  2/17 PharmD. BCPS  11/03/2019 8:31 PM

## 2019-11-03 NOTE — ED Notes (Signed)
There have been four RNs that have tried to get blood on this patient d/t the pt being a difficult stick. MD made aware.

## 2019-11-04 ENCOUNTER — Ambulatory Visit (HOSPITAL_COMMUNITY)
Admission: RE | Admit: 2019-11-04 | Discharge: 2019-11-04 | Disposition: A | Discharge status: Home / Self Care | Payer: Medicare Other | Source: Ambulatory Visit | Attending: Internal Medicine | Admitting: Internal Medicine

## 2019-11-04 ENCOUNTER — Inpatient Hospital Stay (HOSPITAL_COMMUNITY): Payer: Medicare Other

## 2019-11-04 ENCOUNTER — Encounter (HOSPITAL_COMMUNITY): Payer: Self-pay

## 2019-11-04 DIAGNOSIS — F0150 Vascular dementia without behavioral disturbance: Secondary | ICD-10-CM

## 2019-11-04 DIAGNOSIS — R4182 Altered mental status, unspecified: Secondary | ICD-10-CM

## 2019-11-04 LAB — CBG MONITORING, ED
Glucose-Capillary: 109 mg/dL — ABNORMAL HIGH (ref 70–99)
Glucose-Capillary: 41 mg/dL — CL (ref 70–99)
Glucose-Capillary: 63 mg/dL — ABNORMAL LOW (ref 70–99)
Glucose-Capillary: 72 mg/dL (ref 70–99)
Glucose-Capillary: 82 mg/dL (ref 70–99)
Glucose-Capillary: 98 mg/dL (ref 70–99)

## 2019-11-04 LAB — BASIC METABOLIC PANEL
Anion gap: 11 (ref 5–15)
Anion gap: 7 (ref 5–15)
BUN: 22 mg/dL (ref 8–23)
BUN: 22 mg/dL (ref 8–23)
CO2: 20 mmol/L — ABNORMAL LOW (ref 22–32)
CO2: 20 mmol/L — ABNORMAL LOW (ref 22–32)
Calcium: 8.1 mg/dL — ABNORMAL LOW (ref 8.9–10.3)
Calcium: 8 mg/dL — ABNORMAL LOW (ref 8.9–10.3)
Chloride: 127 mmol/L — ABNORMAL HIGH (ref 98–111)
Chloride: 124 mmol/L — ABNORMAL HIGH (ref 98–111)
Creatinine, Ser: 0.91 mg/dL (ref 0.44–1.00)
Creatinine, Ser: 0.78 mg/dL (ref 0.44–1.00)
GFR calc Af Amer: >60 mL/min (ref >60)
GFR calc Af Amer: >60 mL/min (ref >60)
GFR calc non Af Amer: >60 mL/min (ref >60)
GFR calc non Af Amer: >60 mL/min (ref >60)
Glucose, Bld: 107 mg/dL — ABNORMAL HIGH (ref 70–99)
Glucose, Bld: 101 mg/dL — ABNORMAL HIGH (ref 70–99)
Potassium: 2.3 mmol/L — CL (ref 3.5–5.1)
Potassium: 7.3 mmol/L (ref 3.5–5.1)
Sodium: 158 mmol/L — ABNORMAL HIGH (ref 135–145)
Sodium: 151 mmol/L — ABNORMAL HIGH (ref 135–145)

## 2019-11-04 LAB — PROTIME-INR
INR: 1.9 — ABNORMAL HIGH (ref 0.8–1.2)
Prothrombin Time: 21.3 seconds — ABNORMAL HIGH (ref 11.4–15.2)

## 2019-11-04 LAB — CBC
HCT: 43.1 % (ref 36.0–46.0)
HCT: 28.3 % — ABNORMAL LOW (ref 36.0–46.0)
Hemoglobin: 13.1 g/dL (ref 12.0–15.0)
Hemoglobin: 8.3 g/dL — ABNORMAL LOW (ref 12.0–15.0)
MCH: 31.2 pg (ref 26.0–34.0)
MCH: 31.3 pg (ref 26.0–34.0)
MCHC: 30.4 g/dL (ref 30.0–36.0)
MCHC: 29.3 g/dL — ABNORMAL LOW (ref 30.0–36.0)
MCV: 102.6 fL — ABNORMAL HIGH (ref 80.0–100.0)
MCV: 106.8 fL — ABNORMAL HIGH (ref 80.0–100.0)
Platelets: 77 10*3/uL — ABNORMAL LOW (ref 150–400)
Platelets: 65 10*3/uL — ABNORMAL LOW (ref 150–400)
RBC: 4.2 MIL/uL (ref 3.87–5.11)
RBC: 2.65 MIL/uL — ABNORMAL LOW (ref 3.87–5.11)
RDW: 14.7 % (ref 11.5–15.5)
RDW: 14.9 % (ref 11.5–15.5)
WBC: 25.1 10*3/uL — ABNORMAL HIGH (ref 4.0–10.5)
WBC: 15 10*3/uL — ABNORMAL HIGH (ref 4.0–10.5)
nRBC: 0 % (ref 0.0–0.2)
nRBC: 0 % (ref 0.0–0.2)

## 2019-11-04 LAB — RENAL FUNCTION PANEL
Albumin: 1.8 g/dL — ABNORMAL LOW (ref 3.5–5.0)
Anion gap: 12 (ref 5–15)
BUN: 19 mg/dL (ref 8–23)
CO2: 16 mmol/L — ABNORMAL LOW (ref 22–32)
Calcium: 7.4 mg/dL — ABNORMAL LOW (ref 8.9–10.3)
Chloride: 115 mmol/L — ABNORMAL HIGH (ref 98–111)
Creatinine, Ser: 0.82 mg/dL (ref 0.44–1.00)
GFR calc Af Amer: >60 mL/min (ref >60)
GFR calc non Af Amer: >60 mL/min (ref >60)
Glucose, Bld: 359 mg/dL — ABNORMAL HIGH (ref 70–99)
Phosphorus: 2.2 mg/dL — ABNORMAL LOW (ref 2.5–4.6)
Potassium: 2.9 mmol/L — ABNORMAL LOW (ref 3.5–5.1)
Sodium: 143 mmol/L (ref 135–145)

## 2019-11-04 LAB — URINE CULTURE: Culture: NO GROWTH

## 2019-11-04 LAB — MAGNESIUM
Magnesium: 1.9 mg/dL (ref 1.7–2.4)
Magnesium: 1.9 mg/dL (ref 1.7–2.4)

## 2019-11-04 LAB — HIV ANTIBODY (ROUTINE TESTING W REFLEX): HIV Screen 4th Generation wRfx: NONREACTIVE

## 2019-11-04 MED ORDER — POTASSIUM CHLORIDE 10 MEQ/100ML IV SOLN
10.0000 meq | INTRAVENOUS | Status: AC
  Administered 2019-11-04 (×4): 10 meq via INTRAVENOUS
  Filled 2019-11-04 (×4): qty 100

## 2019-11-04 MED ORDER — HYDRALAZINE HCL 20 MG/ML IJ SOLN
10.0000 mg | INTRAMUSCULAR | Status: DC | PRN
  Administered 2019-11-07: 10 mg via INTRAVENOUS
  Filled 2019-11-04: qty 1

## 2019-11-04 MED ORDER — SODIUM CHLORIDE 0.9 % IV SOLN: INTRAVENOUS | Status: DC

## 2019-11-04 MED ORDER — SODIUM CHLORIDE 0.9 % IV BOLUS
1000.0000 mL | Freq: Once | INTRAVENOUS | Status: AC
  Administered 2019-11-04: 1000 mL via INTRAVENOUS

## 2019-11-04 MED ORDER — CALCIUM GLUCONATE-NACL 1-0.675 GM/50ML-% IV SOLN
1.0000 g | Freq: Once | INTRAVENOUS | Status: AC
  Administered 2019-11-04: 1000 mg via INTRAVENOUS
  Filled 2019-11-04: qty 50

## 2019-11-04 MED ORDER — DEXTROSE 50 % IV SOLN
INTRAVENOUS | Status: AC
  Administered 2019-11-04: 12.5 g
  Filled 2019-11-04: qty 50

## 2019-11-04 MED ORDER — POTASSIUM CHLORIDE 10 MEQ/100ML IV SOLN
10.0000 meq | INTRAVENOUS | Status: DC
  Administered 2019-11-04 (×4): 10 meq via INTRAVENOUS
  Filled 2019-11-04 (×4): qty 100

## 2019-11-04 MED ORDER — ALBUTEROL SULFATE (2.5 MG/3ML) 0.083% IN NEBU
10.0000 mg | INHALATION_SOLUTION | Freq: Once | RESPIRATORY_TRACT | Status: AC
  Administered 2019-11-04: 10 mg via RESPIRATORY_TRACT
  Filled 2019-11-04: qty 12

## 2019-11-04 MED ORDER — VANCOMYCIN HCL IN DEXTROSE 1-5 GM/200ML-% IV SOLN
1000.0000 mg | INTRAVENOUS | Status: AC
  Administered 2019-11-04: 1000 mg via INTRAVENOUS
  Filled 2019-11-04: qty 200

## 2019-11-04 NOTE — ED Notes (Signed)
This nurse attempted blood draw x 1, [place] RN attempted blood draw x 2, Phlebotomy notified of needing blood draw

## 2019-11-04 NOTE — ED Notes (Signed)
POC discussed with pt daughter.

## 2019-11-04 NOTE — ED Notes (Signed)
Assumed care of pt. Pt on cart in no apparent distress. Pt stable. vss on monitors. Equal rise and fall of chest noted. Breathing easy, non-labored on 5L [place]

## 2019-11-04 NOTE — ED Notes (Signed)
Stuck pt 1 once was unable to get pt blood

## 2019-11-04 NOTE — ED Notes (Signed)
RT at bedside.

## 2019-11-04 NOTE — ED Notes (Signed)
hospitalist at bedside

## 2019-11-04 NOTE — ED Notes (Signed)
Dr. Dartha Lodge contacted with updated K value: 2.87mmol/L, will place orders to continue previous potassium chloride IVBP regimen. Aware that daughters have been to bedside.

## 2019-11-04 NOTE — ED Notes (Signed)
Labs drawn, labeled with 2 pt identifiers, and sent to lab 

## 2019-11-04 NOTE — ED Notes (Signed)
EKG given to hospitalist

## 2019-11-04 NOTE — ED Notes (Signed)
Critical Potassium 2.3,  message sent to MD

## 2019-11-04 NOTE — Progress Notes (Addendum)
PROGRESS NOTE    Melissa Jarvis  HEN:[id number] DOB: [birth year] DOA: 11/03/2019 PCP: Hennie Duos, MD  Outpatient Specialists:   Brief Narrative:  Patient is a 79 year old African-American female with advanced dementia, hypertension, hyperlipidemia and history of seizure.  Patient was admitted with fever, shortness of breath and worsening mentation.  Chest x-ray done on admission revealed right-sided pneumonia.  Potassium was said to be 2.3 on admission.  However, repeat potassium level was said to be 7.3!!  Efforts have been made to repeat renal panel stat.  Low blood sugar is also noted, with blood sugar of 45 documented.  Patient has had to receive D50 which could make elevated potassium worse i.e. if the potassium is actually elevated.  Will await repeat renal panel.  Discuss CODE STATUS and goal of care with patient's daughter, Melissa Jarvis (phone #[phone]).  Patient's daughter intends to discuss goal of care with the sister and get back to [place].  Patient remains critically ill.  Assessment & Plan:   Principal Problem:   Acute respiratory failure with hypoxia (HCC) Active Problems:   Dementia ([place])   Hypernatremia   Hypertension   Sepsis ([place])  1. Acute respiratory failure with sepsis secondary to community-acquired pneumonia for which patient is placed on empiric antibiotics.  We will get a speech therapy evaluation for swallow.  Follow cultures.  Continue hydration. 2. Severe Hypernatremia and mild hypokalemia-likely from dehydration and poor oral intake for which I have placed patient on D5W.  Closely follow metabolic panel.  Correct potassium. 3. Uncontrolled hypertension for which we will keep patient on as needed IV hydralazine and continue home medications when patient can swallow.  Patient is on clonidine patch. 4. History of seizures on Keppra which I had was IV for now until patient passes swallow.  On my exam patient opens her eyes on calling her name but I do not think  patient is having any active seizures.  Will check EEG. 5. Thrombocytopenia appears to be chronic. 6. Severe protein calorie malnutrition will need nutrition input.  Note that patient plan is planned to have a PEG tube placed eventually. 7. Dementia on Namenda. 8. Hyperlipidemia on statins.   DVT prophylaxis: Subcutaneous Lovenox Code Status: Full code Family Communication: Daughter Melissa Jarvis) Disposition Plan: This will depend on goal of care and hospital course   Consultants:   Palliative care team  Procedures:   None  Antimicrobials:   IV azithromycin  IV Rocephin   Subjective: No history from patient.  Patient is lethargic  Objective: Vitals:   11/04/19 1030 11/04/19 1100 11/04/19 1200 11/04/19 1230  BP: (!) 177/85 (!) 176/97 (!) 173/84 (!) 152/78  Pulse:      Resp: (!) 31 (!) 24 15 (!) 21  Temp:      TempSrc:      SpO2:       No intake or output data in the 24 hours ending 11/04/19 1336 There were no vitals filed for this visit.  Examination:  General exam: Acute on chronic ill looking.  Lethargic. Respiratory system: Decreased air entry with transmitted sounds.   Cardiovascular system: S1 & S2 heard Gastrointestinal system: Abdomen is nondistended, soft and nontender. No organomegaly or masses felt. Normal bowel sounds heard. Central nervous system: Lethargic.  Will not comply with full neuro exam.   Extremities: Chronically ill looking mild edema from immobility.  Data Reviewed: I have personally reviewed following labs and imaging studies  CBC: Recent Labs  Lab 11/03/19 2016 11/03/19 2019 11/04/19 0456  WBC  --  23.1* 25.1*  NEUTROABS  --  19.6*  --   HGB 12.9 14.1 13.1  HCT 38.0 45.9 43.1  MCV  --  101.1* 102.6*  PLT  --  100* 77*   Basic Metabolic Panel: Recent Labs  Lab 11/03/19 2001 11/03/19 2016 11/04/19 0456 11/04/19 1201  NA 159* 162* 158* 151*  K 3.4* 2.7* 2.3* 7.3*  CL 127* 126* 127* 124*  CO2 20*  --  20* 20*  GLUCOSE 106*  108* 107* 101*  BUN 21 21 22 22   CREATININE 0.88 0.90 0.91 0.78  CALCIUM 8.1*  --  8.1* 8.0*  MG  --   --   --  1.9   GFR: Estimated Creatinine Clearance: 54.3 mL/min (by C-G formula based on SCr of 0.78 mg/dL). Liver Function Tests: Recent Labs  Lab 11/03/19 2001  AST 22  ALT 11  ALKPHOS 96  BILITOT 2.4*  PROT 5.3*  ALBUMIN 2.0*   No results for input(s): LIPASE, AMYLASE in the last 168 hours. No results for input(s): AMMONIA in the last 168 hours. Coagulation Profile: No results for input(s): INR, PROTIME in the last 168 hours. Cardiac Enzymes: No results for input(s): CKTOTAL, CKMB, CKMBINDEX, TROPONINI in the last 168 hours. BNP (last 3 results) No results for input(s): PROBNP in the last 8760 hours. HbA1C: No results for input(s): HGBA1C in the last 72 hours. CBG: Recent Labs  Lab 11/03/19 2304 11/04/19 0022 11/04/19 0725 11/04/19 1209 11/04/19 1312  GLUCAP 94 109* 98 63* 41*   Lipid Profile: Recent Labs    11/03/19 2001  TRIG 92   Thyroid Function Tests: No results for input(s): TSH, T4TOTAL, FREET4, T3FREE, THYROIDAB in the last 72 hours. Anemia Panel: Recent Labs    11/03/19 2020  FERRITIN 557*   Urine analysis:    Component Value Date/Time   COLORURINE AMBER (A) 11/03/2019 2101   APPEARANCEUR HAZY (A) 11/03/2019 2101   LABSPEC 1.014 11/03/2019 2101   PHURINE 5.0 11/03/2019 2101   GLUCOSEU NEGATIVE 11/03/2019 2101   HGBUR NEGATIVE 11/03/2019 2101   BILIRUBINUR NEGATIVE 11/03/2019 2101   KETONESUR NEGATIVE 11/03/2019 2101   PROTEINUR 30 (A) 11/03/2019 2101   UROBILINOGEN 1.0 12/16/2014 1330   NITRITE NEGATIVE 11/03/2019 2101   LEUKOCYTESUR NEGATIVE 11/03/2019 2101   Sepsis Labs: @LABRCNTIP (procalcitonin:4,lacticidven:4)  ) Recent Results (from the past 240 hour(s))  Blood Culture (routine x 2)     Status: None (Preliminary result)   Collection Time: 11/03/19  7:00 PM   Specimen: BLOOD  Result Value Ref Range Status   Specimen  Description BLOOD RIGHT HAND  Final   Special Requests   Final    BOTTLES DRAWN AEROBIC AND ANAEROBIC Blood Culture results may not be optimal due to an inadequate volume of blood received in culture bottles   Culture   Final    NO GROWTH < 24 HOURS Performed at [facility], [address], [place], [address] [place]    Report Status PENDING  Incomplete  Respiratory Panel by RT PCR (Flu A&B, Covid) - Nasopharyngeal Swab     Status: None   Collection Time: 11/03/19  7:43 PM   Specimen: Nasopharyngeal Swab  Result Value Ref Range Status   SARS Coronavirus 2 by RT PCR NEGATIVE NEGATIVE Final    Comment: (NOTE) SARS-CoV-2 target nucleic acids are NOT DETECTED. The SARS-CoV-2 RNA is generally detectable in upper respiratoy specimens during the acute phase of infection. The lowest concentration of SARS-CoV-2 viral copies  this assay can detect is 131 copies/mL. A negative result does not preclude SARS-Cov-2 infection and should not be used as the sole basis for treatment or other patient management decisions. A negative result may occur with  improper specimen collection/handling, submission of specimen other than nasopharyngeal swab, presence of viral mutation(s) within the areas targeted by this assay, and inadequate number of viral copies (<131 copies/mL). A negative result must be combined with clinical observations, patient history, and epidemiological information. The expected result is Negative. Fact Sheet for Patients:  https://www.moore.com/ Fact Sheet for Healthcare Providers:  https://www.young.biz/ This test is not yet ap proved or cleared by the [place] FDA and  has been authorized for detection and/or diagnosis of SARS-CoV-2 by FDA under an Emergency Use Authorization (EUA). This EUA will remain  in effect (meaning this test can be used) for the duration of the COVID-19 declaration under Section 564(b)(1) of the Act, 21  U.S.C. section 360bbb-3(b)(1), unless the authorization is terminated or revoked sooner.    Influenza A by PCR NEGATIVE NEGATIVE Final   Influenza B by PCR NEGATIVE NEGATIVE Final    Comment: (NOTE) The Xpert Xpress SARS-CoV-2/FLU/RSV assay is intended as an aid in  the diagnosis of influenza from Nasopharyngeal swab specimens and  should not be used as a sole basis for treatment. Nasal washings and  aspirates are unacceptable for Xpert Xpress SARS-CoV-2/FLU/RSV  testing. Fact Sheet for Patients: https://www.moore.com/ Fact Sheet for Healthcare Providers: https://www.young.biz/ This test is not yet approved or cleared by the [place] FDA and  has been authorized for detection and/or diagnosis of SARS-CoV-2 by  FDA under an Emergency Use Authorization (EUA). This EUA will remain  in effect (meaning this test can be used) for the duration of the  Covid-19 declaration under Section 564(b)(1) of the Act, 21  U.S.C. section 360bbb-3(b)(1), unless the authorization is  terminated or revoked. Performed at [facility], [address] [place], [place] [address]   Blood Culture (routine x 2)     Status: None (Preliminary result)   Collection Time: 11/03/19  8:02 PM   Specimen: BLOOD  Result Value Ref Range Status   Specimen Description BLOOD RIGHT HAND  Final   Special Requests   Final    BOTTLES DRAWN AEROBIC ONLY Blood Culture results may not be optimal due to an inadequate volume of blood received in culture bottles   Culture   Final    NO GROWTH < 24 HOURS Performed at [facility], [address] [place], [place] [address]    Report Status PENDING  Incomplete         Radiology Studies: EEG  Result Date: 11/04/2019 Charlsie Quest, MD     11/04/2019  9:19 AM Patient Name: KIRSTIN KUGLER MRN: [id number] Epilepsy Attending: Charlsie Quest Referring Physician/Provider: Dr Midge Minium Date: 11/04/2019 Duration:  24.40 mins Patient history: 79 y.o. female with history of advanced dementia, hypertension, hyperlipidemia, history of seizures who presented with SOB and AMS. Level of alertness: lethargic AEDs during EEG study: LEV Technical aspects: This EEG study was done with scalp electrodes positioned according to the 10-20 International system of electrode placement. Electrical activity was acquired at a sampling rate of 500Hz  and reviewed with a high frequency filter of 70Hz  and a low frequency filter of 1Hz . EEG data were recorded continuously and digitally stored. DESCRIPTION: No clear posterior dominant rhythm was seen. EEG showed continuous generalized, maximal bitemporal 3-6Hz  theta-delta slowing. Hyperventilation and photic  stimulation were not performed. ABNORMALITY - Continuous slow, generalized IMPRESSION: This study is suggestive of bitemporal cortical dysfunction which could be secondary to dementia. Additionally, there is evidence of mild to moderate diffuse encephalopathy, non specific to etiology. No seizures or epileptiform discharges were seen throughout the recording. Charlsie Quest   DG Chest Port 1 View  Result Date: 11/03/2019 CLINICAL DATA:  Pneumonia EXAM: PORTABLE CHEST 1 VIEW COMPARISON:  April 22, 2018 FINDINGS: There is consolidation within the right mid and right lower lung zones. There is no pneumothorax. No large pleural effusion. There is mild elevation of the left hemidiaphragm. The heart size is normal. Dense aortic calcifications are noted. There is no acute osseous abnormality. IMPRESSION: Right-sided pneumonia as above. Follow-up to radiologic resolution is recommended. Aortic Atherosclerosis (ICD10-I70.0). Electronically Signed   By: Katherine Mantle M.D.   On: 11/03/2019 20:14        Scheduled Meds: . albuterol  10 mg Nebulization Once  . amLODipine  10 mg Oral Daily  . aspirin EC  81 mg Oral Daily  . cloNIDine  0.3 mg Transdermal Weekly  . enoxaparin (LOVENOX)  injection  40 mg Subcutaneous Q24H  . losartan  100 mg Oral QHS  . memantine  28 mg Oral QHS  . omega-3 acid ethyl esters  1 g Oral Daily  . pravastatin  40 mg Oral Daily  . rivastigmine  6 mg Oral BID  . vitamin B-12  1,000 mcg Oral Daily   Continuous Infusions: . azithromycin Stopped (11/03/19 2345)  . calcium gluconate    . cefTRIAXone (ROCEPHIN)  IV Stopped (11/03/19 2310)  . dextrose 75 mL/hr at 11/03/19 2234  . levETIRAcetam Stopped (11/04/19 0920)  . sodium chloride       LOS: 1 day    Time spent: 35 minutes    Berton Mount, MD  [facility] Pager #: [phone] 7PM-7AM contact night coverage as above  Addendum done on 11/04/2019 at 6 0 4 PM: -Repeat potassium is 2.9. -We will order IV KCl 10 M EQ every hour x4 hours. -Continue to monitor renal function and electrolytes closely -Further management will depend on hospital course.

## 2019-11-04 NOTE — ED Notes (Addendum)
Report given to and care endorsed to [place], [place]. Pt transported to 2W RM 15 in NAD with all belongings on continuous monitors. Pt breathing non-labored. Equal rise and fall of chest noted.

## 2019-11-04 NOTE — ED Notes (Signed)
EEG at bedside.

## 2019-11-04 NOTE — Procedures (Signed)
Patient Name: Melissa Jarvis  MRN: [id number]  Epilepsy Attending: Charlsie Quest  Referring Physician/Provider: Dr Midge Minium Date: 11/04/2019  Duration: 24.40 mins  Patient history: 79 y.o. female with history of advanced dementia, hypertension, hyperlipidemia, history of seizures who presented with SOB and AMS.   Level of alertness: lethargic  AEDs during EEG study: LEV  Technical aspects: This EEG study was done with scalp electrodes positioned according to the 10-20 International system of electrode placement. Electrical activity was acquired at a sampling rate of 500Hz  and reviewed with a high frequency filter of 70Hz  and a low frequency filter of 1Hz . EEG data were recorded continuously and digitally stored.   DESCRIPTION: No clear posterior dominant rhythm was seen. EEG showed continuous generalized, maximal bitemporal 3-6Hz  theta-delta slowing. Hyperventilation and photic stimulation were not performed.  ABNORMALITY - Continuous slow, generalized  IMPRESSION: This study is suggestive of bitemporal cortical dysfunction which could be secondary to dementia. Additionally, there is evidence of mild to moderate diffuse encephalopathy, non specific to etiology. No seizures or epileptiform discharges were seen throughout the recording.    Khilee Hendricksen 

## 2019-11-04 NOTE — ED Notes (Signed)
Available meds given per [facility]

## 2019-11-04 NOTE — ED Notes (Addendum)
hospitalist paged for critical potassium and CBG.

## 2019-11-04 NOTE — Evaluation (Signed)
Clinical/Bedside Swallow Evaluation Patient Details  Name: Melissa Jarvis MRN: [id number] Date of Birth: Jun 03, 1941  Today's Date: 11/04/2019 Time: SLP Start Time (ACUTE ONLY): 1315 SLP Stop Time (ACUTE ONLY): 1325 SLP Time Calculation (min) (ACUTE ONLY): 10 min  Past Medical History:  Past Medical History:  Diagnosis Date  . Alzheimer's disease ([place])   . Chronic constipation   . Depressed   . Failure to thrive in adult   . HCAP (healthcare-associated pneumonia)   . Hyperlipidemia   . Hypernatremia   . Hypertension   . Hypokalemia   . Low back pain   . Migraines   . Osteoarthritis   . Osteoporosis   . PBA (pseudobulbar affect)   . Post-ictal state ([place])   . Seizures ([place])   . Urinary tract infection with hematuria   . Vitamin B12 deficiency    Past Surgical History:  Past Surgical History:  Procedure Laterality Date  . ABDOMINAL HYSTERECTOMY    . CHOLECYSTECTOMY    . KNEE SURGERY     total   HPI:  79 y.o. female with history of advanced dementia, hypertension, hyperlipidemia,  seizures was brought to the ER 1/17 with SOB.  As per the daughter who spoke to the ER physician patient has been doing poorly over the last few weeks with poor intake.  Plan was to have feeding tube placed for the poor nutrition.  Given the acute changes patient was brought to the ER.  Pt has been followed by SLP services during previous admissions with functional swallowing.  Dx acute respiratory failure with sepsis secondary to community-acquired pneumonia, severe hypernatremia.    Assessment / Plan / Recommendation Clinical Impression  Pt with poor responsiveness and inability to participate in full swallow assessment. HOB elevated and pt repositioned to optimize performance. Her eyes were open, but she did not follow commands nor engage with examiner in any way.  Attempts to elicit a swallow or motor movement were facilitated - pt demonstrated no recognition of ice chips or 1/4 teaspoon water  at lips or in anterior oral cavity; material drained passively from mouth. No spontaneous swallows were observed. Pt is not safe for any PO intake.  Chart indicates family may proceed with PEG - if pt has not had a Palliative consult in the past, she may benefit during this hospitalization.  SLP will follow briefly for improvements/readiness for POs.  SLP Visit Diagnosis: Dysphagia, oropharyngeal phase (R13.12)    Aspiration Risk  Severe aspiration risk    Diet Recommendation   npo      Other  Recommendations Oral Care Recommendations: Oral care QID   Follow up Recommendations        Frequency and Duration min 2x/week  1 week       Prognosis Prognosis for Safe Diet Advancement: Guarded      Swallow Study   General Date of Onset: 11/03/19 HPI: 79 y.o. female with history of advanced dementia, hypertension, hyperlipidemia,  seizures was brought to the ER 1/17 with SOB.  As per the daughter who spoke to the ER physician patient has been doing poorly over the last few weeks with poor intake.  Plan was to have feeding tube placed for the poor nutrition.  Given the acute changes patient was brought to the ER.  Pt has been followed by SLP services during previous admissions with functional swallowing.  Dx acute respiratory failure with sepsis secondary to community-acquired pneumonia, severe hypernatremia.  Type of Study: Bedside Swallow Evaluation Diet Prior to  this Study: NPO Temperature Spikes Noted: Yes Respiratory Status: Room air History of Recent Intubation: No Behavior/Cognition: Lethargic/Drowsy Oral Cavity Assessment: Within Functional Limits Oral Cavity - Dentition: Edentulous Self-Feeding Abilities: Total assist Patient Positioning: Upright in bed Baseline Vocal Quality: Not observed Volitional Cough: Cognitively unable to elicit Volitional Swallow: Unable to elicit    Oral/Motor/Sensory Function Overall Oral Motor/Sensory Function: (unable to assess)   Ice Chips Ice  chips: Impaired Presentation: Spoon Oral Phase Impairments: Reduced labial seal;Poor awareness of bolus Oral Phase Functional Implications: Right anterior spillage;Left anterior spillage Pharyngeal Phase Impairments: Unable to trigger swallow   Thin Liquid Thin Liquid: Impaired Presentation: Spoon Oral Phase Impairments: Reduced labial seal;Poor awareness of bolus Oral Phase Functional Implications: Right anterior spillage;Left anterior spillage    Nectar Thick Nectar Thick Liquid: Not tested   Honey Thick Honey Thick Liquid: Not tested   Puree Puree: Not tested   Solid     Solid: Not tested      Blenda Mounts Laurice 11/04/2019,1:41 PM  Marchelle Folks L. Samson Frederic, MA CCC/SLP Acute Rehabilitation Services Office number [phone] Pager [phone]

## 2019-11-04 NOTE — ED Notes (Signed)
Pt suctioned. Peri care performed and full bed linen change performed. HOB elevated. Heels elevated.

## 2019-11-04 NOTE — ED Notes (Signed)
This nurse called phlebotomy for ordered labs.

## 2019-11-04 NOTE — Progress Notes (Signed)
EEG complete - results pending 

## 2019-11-04 NOTE — ED Notes (Signed)
Pt's daughters called and updated on plan of care

## 2019-11-05 DIAGNOSIS — I10 Essential (primary) hypertension: Secondary | ICD-10-CM

## 2019-11-05 DIAGNOSIS — L8990 Pressure ulcer of unspecified site, unspecified stage: Secondary | ICD-10-CM | POA: Insufficient documentation

## 2019-11-05 DIAGNOSIS — E876 Hypokalemia: Secondary | ICD-10-CM

## 2019-11-05 DIAGNOSIS — Z66 Do not resuscitate: Secondary | ICD-10-CM

## 2019-11-05 DIAGNOSIS — Z7189 Other specified counseling: Secondary | ICD-10-CM

## 2019-11-05 DIAGNOSIS — L89152 Pressure ulcer of sacral region, stage 2: Secondary | ICD-10-CM

## 2019-11-05 DIAGNOSIS — R6520 Severe sepsis without septic shock: Secondary | ICD-10-CM

## 2019-11-05 LAB — GLUCOSE, CAPILLARY
Glucose-Capillary: 53 mg/dL — ABNORMAL LOW (ref 70–99)
Glucose-Capillary: 71 mg/dL (ref 70–99)
Glucose-Capillary: 82 mg/dL (ref 70–99)
Glucose-Capillary: 63 mg/dL — ABNORMAL LOW (ref 70–99)
Glucose-Capillary: 106 mg/dL — ABNORMAL HIGH (ref 70–99)
Glucose-Capillary: 62 mg/dL — ABNORMAL LOW (ref 70–99)
Glucose-Capillary: 80 mg/dL (ref 70–99)
Glucose-Capillary: 73 mg/dL (ref 70–99)
Glucose-Capillary: 63 mg/dL — ABNORMAL LOW (ref 70–99)
Glucose-Capillary: 72 mg/dL (ref 70–99)

## 2019-11-05 LAB — RENAL FUNCTION PANEL
Albumin: 1.8 g/dL — ABNORMAL LOW (ref 3.5–5.0)
Anion gap: 10 (ref 5–15)
BUN: 21 mg/dL (ref 8–23)
CO2: 19 mmol/L — ABNORMAL LOW (ref 22–32)
Calcium: 8 mg/dL — ABNORMAL LOW (ref 8.9–10.3)
Chloride: 118 mmol/L — ABNORMAL HIGH (ref 98–111)
Creatinine, Ser: 0.91 mg/dL (ref 0.44–1.00)
GFR calc Af Amer: >60 mL/min (ref >60)
GFR calc non Af Amer: >60 mL/min (ref >60)
Glucose, Bld: 159 mg/dL — ABNORMAL HIGH (ref 70–99)
Phosphorus: 1.9 mg/dL — ABNORMAL LOW (ref 2.5–4.6)
Potassium: 5.6 mmol/L — ABNORMAL HIGH (ref 3.5–5.1)
Sodium: 147 mmol/L — ABNORMAL HIGH (ref 135–145)

## 2019-11-05 LAB — CBC WITH DIFFERENTIAL/PLATELET
Abs Immature Granulocytes: 0.53 10*3/uL — ABNORMAL HIGH (ref 0.00–0.07)
Basophils Absolute: 0.1 10*3/uL (ref 0.0–0.1)
Basophils Relative: 0 %
Eosinophils Absolute: 0 10*3/uL (ref 0.0–0.5)
Eosinophils Relative: 0 %
HCT: 40.4 % (ref 36.0–46.0)
Hemoglobin: 13 g/dL (ref 12.0–15.0)
Immature Granulocytes: 2 %
Lymphocytes Relative: 5 %
Lymphs Abs: 1.1 10*3/uL (ref 0.7–4.0)
MCH: 30.7 pg (ref 26.0–34.0)
MCHC: 32.2 g/dL (ref 30.0–36.0)
MCV: 95.3 fL (ref 80.0–100.0)
Monocytes Absolute: 1.3 10*3/uL — ABNORMAL HIGH (ref 0.1–1.0)
Monocytes Relative: 6 %
Neutro Abs: 19.2 10*3/uL — ABNORMAL HIGH (ref 1.7–7.7)
Neutrophils Relative %: 87 %
Platelets: 77 10*3/uL — ABNORMAL LOW (ref 150–400)
RBC: 4.24 MIL/uL (ref 3.87–5.11)
RDW: 14.5 % (ref 11.5–15.5)
WBC: 22.2 10*3/uL — ABNORMAL HIGH (ref 4.0–10.5)
nRBC: 0 % (ref 0.0–0.2)

## 2019-11-05 LAB — MRSA PCR SCREENING: MRSA by PCR: NEGATIVE

## 2019-11-05 LAB — MAGNESIUM: Magnesium: 1.8 mg/dL (ref 1.7–2.4)

## 2019-11-05 MED ORDER — COLLAGENASE 250 UNIT/GM EX OINT
TOPICAL_OINTMENT | Freq: Every day | CUTANEOUS | Status: DC
  Filled 2019-11-05: qty 30

## 2019-11-05 MED ORDER — DEXTROSE 50 % IV SOLN
INTRAVENOUS | Status: AC
  Administered 2019-11-05: 25 mL
  Filled 2019-11-05: qty 50

## 2019-11-05 MED ORDER — DEXTROSE 50 % IV SOLN
INTRAVENOUS | Status: AC
  Administered 2019-11-05: 25 g via INTRAVENOUS
  Filled 2019-11-05: qty 50

## 2019-11-05 MED ORDER — CHLORHEXIDINE GLUCONATE 0.12 % MT SOLN
15.0000 mL | Freq: Two times a day (BID) | OROMUCOSAL | Status: DC
  Administered 2019-11-05 (×2): 15 mL via OROMUCOSAL
  Filled 2019-11-05 (×2): qty 15

## 2019-11-05 MED ORDER — ORAL CARE MOUTH RINSE
15.0000 mL | Freq: Two times a day (BID) | OROMUCOSAL | Status: DC
  Administered 2019-11-05 – 2019-11-07 (×6): 15 mL via OROMUCOSAL

## 2019-11-05 MED ORDER — DEXTROSE 10 % IV SOLN: INTRAVENOUS | Status: AC

## 2019-11-05 MED ORDER — DEXTROSE 5 % IV SOLN: INTRAVENOUS | Status: DC

## 2019-11-05 MED ORDER — DEXTROSE 50 % IV SOLN
INTRAVENOUS | Status: AC
  Administered 2019-11-05: 50 mL
  Filled 2019-11-05: qty 50

## 2019-11-05 MED ORDER — SCOPOLAMINE 1 MG/3DAYS TD PT72
1.0000 | MEDICATED_PATCH | TRANSDERMAL | Status: DC
  Administered 2019-11-05: 1.5 mg via TRANSDERMAL
  Filled 2019-11-05 (×2): qty 1

## 2019-11-05 MED ORDER — DEXTROSE 50 % IV SOLN: 25.0000 g | INTRAVENOUS | Status: AC

## 2019-11-05 NOTE — Progress Notes (Signed)
Nutrition Brief Note  Chart reviewed. Pt now DNR; pt and family declining tube feeding.  No further nutrition interventions warranted at this time.  Please re-consult as needed.   Eugene Gavia, MS, RD, LDN Pager: [phone] Weekend/After Hours Pager: [phone]

## 2019-11-05 NOTE — Progress Notes (Signed)
SLP Cancellation Note  Patient Details Name: Melissa Jarvis MRN: [id number] DOB: Dec 26, 1940   Cancelled treatment:        Pt seen at bedside to assess readiness for po intake, however, pt was poorly responsive to any stimulus aside from slight grimacing in response to sternal rub. RN and MD summoned. No PO trials given at this time. SLP will continue to follow to assess readiness to proceed.  Vonte Rossin B. Murvin Natal, [facility], CCC-SLP Speech Language Pathologist Office: [phone] Pager: [phone]   Leigh Aurora 11/05/2019, 9:15 AM

## 2019-11-05 NOTE — Consult Note (Signed)
Consultation Note Date: 11/05/2019   Patient Name: Melissa Jarvis  DOB: [birth year]  MRN: [id number]  Age / Sex: 79 y.o., female   PCP: Melissa Hanks, MD Referring Physician: Vassie Loll, MD   REASON FOR CONSULTATION:Establishing goals of care  Palliative Care consult requested for this 79 y.o. female with multiple medical problems including advanced dementia, hypertension, hyperlipidemia, seizures, osteoarthritis, and failure to thrive. She presented to the ED with complaints of poor po intake and shortness of breath. Patient recently had a COVID test which was negative. During her ED work-up temp was 101.1, NA 159, potassium 3.4, WBC 23.1. Patient was started on IV hydration and antibiotics post cultures. Rapid response called in the setting of unresponsive and poor oxygenation. Patient was placed on 15L NRB.   Clinical Assessment and Goals of Care: I have reviewed medical records including lab results, imaging, Epic notes, and MAR, received report from the bedside RN, and assessed the patient. I spoke with patient's daughters via phone, Melissa Jarvis and Melissa Jarvis  to discuss diagnosis prognosis, GOC, EOL wishes, disposition and options.  I introduced Palliative Medicine as specialized medical care for people living with serious illness. It focuses on providing relief from the symptoms and stress of a serious illness. The goal is to improve quality of life for both the patient and the family.  Family reports patient has been a resident at [facility] facility for almost 3 years after health decline. Prior to placement she was living in the home with her daughter Melissa Jarvis. Patient is a native of [place] and retired from her career in the furniture. She has 2 daughters and several grandchildren whom she loved dearly.   Prior to admission daughters report a significant decline over the past several months in the setting of COVID. They expressed patient began declining once  they were unable to physically see and visit patient. Months ago patient was interactive, eating and drinking. Over the past month she has become less interactive, decreased appetite, and less communicative.   We discussed Her current illness and what it means in the larger context of Her on-going co-morbidities. With specific discussions regarding her overall functional and nutritional decline. Natural disease trajectory and expectations at EOL were discussed.  Daughters verbalized understanding. They were tearful in discussion expressing their previous hopes that she would "turn the corner" and show signs of getting better however they feel she is close to the end of life. Daughters shared memories of their mother and their distress dealing with not seeing her during COVID. Therapeutic listening and support given.   I attempted to elicit values and goals of care important to the patient.    The difference between aggressive medical intervention and comfort care was considered in light of the patient's goals of care. I educated family on what comfort care measures would look like. I discussed with daughters patient would no longer receive aggressive medical interventions such as continuous vital signs, lab work, radiology testing, or medications not focused on comfort. All care would focus on how the patient is looking and feeling. This would include management of any symptoms that may cause discomfort, pain, shortness of breath, cough, nausea, agitation, anxiety, and/or secretions etc. Symptoms will be managed with medications and other non-pharmacological interventions such as spiritual support if requested, repositioning, music therapy, or therapeutic listening. Family verbalized understanding and appreciation.   Family is requesting to watchfully wait over the next 24 hours and if no significant change they  are leaning towards comfort and allowing her to be at peace during EOL. Daughters verbalized  they would not want patient to return to [facility] once she transitions to comfort. We dicussed transitioning to comfort while hospitalized and if stable for transfer consideration for hospice home placement.   Hospice outpatient were explained and offered. Family verbalized their understanding and awareness of hospice's goals and philosophy of care. I educated family on residential hospice continuation of care and the different locations. Family verbalized understanding and appreciation. They expressed once they make the decision [facility] in [facility] would be their preferred location.   Family confirms DNR/DNI, continue with current plan of care with no aggressive interventions or escalation.   Questions and concerns were addressed.The family was encouraged to call with questions or concerns.  PMT will continue to support holistically.   SOCIAL HISTORY:     reports that she has quit smoking. She has never used smokeless tobacco. She reports that she does not drink alcohol or use drugs.  CODE STATUS: DNR  ADVANCE DIRECTIVES: Daughters   SYMPTOM MANAGEMENT: per attending   Palliative Prophylaxis:   Aspiration, Bowel Regimen, Delirium Protocol, Eye Care, Frequent Pain Assessment, Oral Care and Turn Reposition  PSYCHO-SOCIAL/SPIRITUAL:  Support System: Family  Desire for further Chaplaincy support:NO   Additional Recommendations (Limitations, Scope, Preferences):  Continue current plan no escalation.    PAST MEDICAL HISTORY: Past Medical History:  Diagnosis Date  . Alzheimer's disease (HCC)   . Chronic constipation   . Depressed   . Failure to thrive in adult   . HCAP (healthcare-associated pneumonia)   . Hyperlipidemia   . Hypernatremia   . Hypertension   . Hypokalemia   . Low back pain   . Migraines   . Osteoarthritis   . Osteoporosis   . PBA (pseudobulbar affect)   . Post-ictal state (HCC)   . Seizures (HCC)   . Urinary tract infection with  hematuria   . Vitamin B12 deficiency     PAST SURGICAL HISTORY:  Past Surgical History:  Procedure Laterality Date  . ABDOMINAL HYSTERECTOMY    . CHOLECYSTECTOMY    . KNEE SURGERY     total    ALLERGIES:  is allergic to penicillins.   MEDICATIONS:  Current Facility-Administered Medications  Medication Dose Route Frequency Provider Last Rate Last Admin  . 0.9 %  sodium chloride infusion   Intravenous Continuous Ralene Muskrat, PA-C 10 mL/hr at 11/05/19 6295 Other (enter comment in med admin window) at 11/05/19 0457  . acetaminophen (TYLENOL) tablet 650 mg  650 mg Oral Q6H PRN Eduard Clos, MD       Or  . acetaminophen (TYLENOL) suppository 650 mg  650 mg Rectal Q6H PRN Eduard Clos, MD      . aspirin EC tablet 81 mg  81 mg Oral Daily Eduard Clos, MD      . azithromycin (ZITHROMAX) 500 mg in sodium chloride 0.9 % 250 mL IVPB  500 mg Intravenous Q24H Eduard Clos, MD 250 mL/hr at 11/04/19 2308 500 mg at 11/04/19 2308  . cefTRIAXone (ROCEPHIN) 2 g in sodium chloride 0.9 % 100 mL IVPB  2 g Intravenous Q24H Eduard Clos, MD   Stopped at 11/04/19 2304  . chlorhexidine (PERIDEX) 0.12 % solution 15 mL  15 mL Mouth Rinse BID Melissa Loll, MD   15 mL at 11/05/19 1100  . cloNIDine (CATAPRES - Dosed in mg/24 hr) patch 0.3 mg  0.3  mg Transdermal Weekly Rise Patience, MD   0.3 mg at 11/04/19 1139  . collagenase (SANTYL) ointment   Topical Daily Barton Dubois, MD   Given at 11/05/19 1308  . dextrose 5 % solution   Intravenous Continuous Barton Dubois, MD 50 mL/hr at 11/05/19 0922 New Bag at 11/05/19 7902  . hydrALAZINE (APRESOLINE) injection 10 mg  10 mg Intravenous Q4H PRN Rise Patience, MD      . levETIRAcetam (KEPPRA) IVPB 500 mg/100 mL premix  500 mg Intravenous Q12H Rise Patience, MD 400 mL/hr at 11/05/19 0930 500 mg at 11/05/19 0930  . MEDLINE mouth rinse  15 mL Mouth Rinse q12n4p Barton Dubois, MD   15 mL at 11/05/19 1158  .  ondansetron (ZOFRAN) tablet 4 mg  4 mg Oral Q6H PRN Rise Patience, MD       Or  . ondansetron [facility]) injection 4 mg  4 mg Intravenous Q6H PRN Rise Patience, MD      . rivastigmine (EXELON) capsule 6 mg  6 mg Oral BID Rise Patience, MD      . scopolamine (TRANSDERM-SCOP) 1 MG/3DAYS 1.5 mg  1 patch Transdermal Q72H Barton Dubois, MD   1.5 mg at 11/05/19 1308    VITAL SIGNS: BP (!) 149/66 (BP Location: Right Arm)   Pulse 60   Temp 97.9 F (36.6 C) (Axillary)   Resp 16   Wt 64.4 kg   SpO2 94%   BMI 22.92 kg/m  Filed Weights   11/04/19 2236  Weight: 64.4 kg    Estimated body mass index is 22.92 kg/m as calculated from the following:   Height as of 10/29/19: 5\' 6"  (1.676 m).   Weight as of this encounter: 64.4 kg.  LABS: CBC:    Component Value Date/Time   WBC 22.2 (H) 11/05/2019 0008   HGB 13.0 11/05/2019 0008   HCT 40.4 11/05/2019 0008   PLT 77 (L) 11/05/2019 0008   Comprehensive Metabolic Panel:    Component Value Date/Time   NA 147 (H) 11/05/2019 0008   NA 146 06/21/2019 0000   K 5.6 (H) 11/05/2019 0008   CO2 19 (L) 11/05/2019 0008   BUN 21 11/05/2019 0008   BUN 7 06/21/2019 0000   CREATININE 0.91 11/05/2019 0008   ALBUMIN 1.8 (L) 11/05/2019 0008     Review of Systems  Unable to perform ROS  Unless otherwise noted, a complete review of systems is negative.  Physical Exam General: NAD, frail chronically-ill appearing, cachectic Cardiovascular: regular rate and rhythm Pulmonary: diminished bases Abdomen: soft, nontender, + bowel sounds GU: no suprapubic tenderness Extremities: no edema, no joint deformities Neurological: Weakness but otherwise nonfocal   Prognosis: Hours - Days if family transitions to full comfort   Discharge Planning:  To Be Determined  Recommendations:  DNR/DNI-as confirmed by family  Continue current plan of care. No aggressive interventions or escalation of care.   Family requesting 24hr watchful  waiting. If no significant changes leaning towards comfort and hospice home transfer. Daughters requesting follow up tomorrow.  Will f/u with family tomorrow at 33.  PMT will continue to support and follow   Palliative Performance Scale: PPS 10%               Family expressed understanding and was in agreement with this plan.   Thank you for allowing the Palliative Medicine Team to assist in the care of this patient.  Time In: 1325 Time Out: 1430 Time Total:65 min.  Visit consisted of counseling and education dealing with the complex and emotionally intense issues of symptom management and palliative care in the setting of serious and potentially life-threatening illness.Greater than 50%  of this time was spent counseling and coordinating care related to the above assessment and plan.  Signed by:  Willette Alma, AGPCNP-BC Palliative Medicine Team  Phone: [phone] Fax: [phone] Pager: [phone] Amion: Thea Alken

## 2019-11-05 NOTE — Progress Notes (Signed)
PROGRESS NOTE    Melissa Jarvis  IFO:[id number] DOB: [birth year] DOA: 11/03/2019 PCP: [name], MD  Outpatient Specialists:   Brief Narrative:  Patient is a 79 year old African-American female with advanced dementia, hypertension, hyperlipidemia and history of seizure.  Patient was admitted with fever, shortness of breath and worsening mentation.  Chest x-ray done on admission revealed right-sided pneumonia.  Potassium was said to be 2.3 on admission.  However, repeat potassium level was said to be 7.3!!  Efforts have been made to repeat renal panel stat.  Low blood sugar is also noted, with blood sugar of 45 documented.  Patient has had to receive D50 which could make elevated potassium worse i.e. if the potassium is actually elevated.  Will await repeat renal panel.  Discuss CODE STATUS and goal of care with patient's daughter, Melissa Jarvis (phone #[phone]).  Patient's daughter intends to discuss goal of care with the sister and get back to [place].  Patient remains critically ill.  Assessment & Plan:   Principal Problem:   Acute respiratory failure with hypoxia (HCC) Active Problems:   Dementia (HCC)   Hypernatremia   Hypertension   Sepsis (HCC)   Pressure injury of skin  1. Acute respiratory failure with sepsis secondary to PNA: patient sepsis features improved with abx's and IVF's. Patient unable to cooperate for Speech evaluation and appears to be high risk due to underlying dementia for aspiration. Continue current abx's for 24 hours and follow clinical course.  2. Severe Hypernatremia, hypoglycemia and abnormal potassium: Sodium down to 147 and potassium repleted.  Will continue 24 hours of D5W in the setting of elevated sodium and hypoglycemia.  If no improvement seen then will be no further blood work for interventions and the patient will be transition to full comfort.  Follow further recommendations after GOC discussion by palliative care. 3. Uncontrolled hypertension: Patient  not taking p.o.'s at this time.  We will continue clonidine patch and as needed hydralazine. 4. History of seizures on Keppra: EEG demonstrated no active seizure activity.  Will continue Keppra IV for now. 5. Thrombocytopenia appears to be chronic: No signs of overt bleeding.  Will use SCD's. 6. Severe protein calorie malnutrition/failure to thrive: Long discussion with patient's daughter; at this time they are declining and understanding how tube feedings will impact patient's condition.  Patient is DNI/DNR and we will not pursued artificial nutrition. 7. Dementia: not taking PO's. Transitioning to comfort care in the next 24 hours if not further response from patient appreciated.  8. Hyperlipidemia: statins will be discontinue, patient is now DNR/DNI and moving into comfort care; also not able to take PO's. 9. DNR/DNI: Poor quality of life and overall poor prognosis.  Goals of care discussion initiated with daughter over the phone and decision has been made for patient to be DNR/DNI and not pursued artificial nutrition.  We will continue care for another 24 hours stabilizing electrolytes and preventing hypoglycemia; follow clinical course and start focusing on keeping patients comfortable.  Palliative care has been consulted.   DVT prophylaxis: Subcutaneous Lovenox Code Status: DNR Family Communication: Daughter [name]) Disposition Plan: palliative care consulted. Will discontinue oral PO's, after long discussion with family will not pursuit invasive treatments or artificial support. Patient is DNR/DNI now and no tube feedings. Will have 24 hours of electrolyte correction and follow further discussion from palliative care for best discharge pathway (residential hospice Vs hospice at [facility]).   Consultants:   Palliative care team  Procedures:   None  Antimicrobials:  IV azithromycin  IV Rocephin   Subjective: Afebrile, good O2 sat on 2 L; no eating, responding only to painful  stimuli. Positive hypoglycemia.   Objective: Vitals:   11/04/19 2239 11/05/19 0544 11/05/19 0803 11/05/19 0805  BP: 122/72 135/63  (!) 149/66  Pulse: 72   60  Resp: 16     Temp: 97.7 F (36.5 C) 97.7 F (36.5 C) 97.9 F (36.6 C)   TempSrc: Oral Axillary Axillary   SpO2: 99% 100%    Weight:        Intake/Output Summary (Last 24 hours) at 11/05/2019 1007 Last data filed at 11/04/2019 2300 Gross per 24 hour  Intake 993.47 ml  Output --  Net 993.47 ml   Filed Weights   11/04/19 2236  Weight: 64.4 kg    Examination: General exam: Afebrile, obtunded and lethargic; not eating and only responsive to painful stimuli.  Respiratory system: positive scattered rhonchi, no wheezing. Respiratory effort normal. Cardiovascular system:RRR. No murmurs, rubs, gallops. Gastrointestinal system: Abdomen is nondistended, soft and nontender. No organomegaly or masses felt. Normal bowel sounds heard. Central nervous system: unable to fully assess given current obtundation; patient retrieving limbs to painful stimuli.  Extremities: No cyanosis or clubbing. Feet felt slightly cold on palpation.  Skin: No rashes; no petechiae.  Positive stage II pressure injury in her sacral area that was present prior to admission. Psychiatry: Unable to properly assess secondary to obtunded/lethargic state.   General exam: Acute on chronic ill looking.  Lethargic. Respiratory system: Decreased air entry with transmitted sounds.   Cardiovascular system: S1 & S2 heard Gastrointestinal system: Abdomen is nondistended, soft and nontender. No organomegaly or masses felt. Normal bowel sounds heard. Central nervous system: Lethargic.  Will not comply with full neuro exam.   Extremities: Chronically ill looking mild edema from immobility.  Data Reviewed: I have personally reviewed following labs and imaging studies  CBC: Recent Labs  Lab 11/03/19 2016 11/03/19 2019 11/04/19 0456 11/04/19 2129 11/05/19 0008  WBC   --  23.1* 25.1* 15.0* 22.2*  NEUTROABS  --  19.6*  --   --  19.2*  HGB 12.9 14.1 13.1 8.3* 13.0  HCT 38.0 45.9 43.1 28.3* 40.4  MCV  --  101.1* 102.6* 106.8* 95.3  PLT  --  100* 77* 65* 77*   Basic Metabolic Panel: Recent Labs  Lab 11/03/19 2001 11/03/19 2001 11/03/19 2016 11/04/19 0456 11/04/19 1201 11/04/19 1645 11/05/19 0008  NA 159*   < > 162* 158* 151* 143 147*  K 3.4*   < > 2.7* 2.3* 7.3* 2.9* 5.6*  CL 127*   < > 126* 127* 124* 115* 118*  CO2 20*  --   --  20* 20* 16* 19*  GLUCOSE 106*   < > 108* 107* 101* 359* 159*  BUN 21   < > 21 22 22 19 21   CREATININE 0.88   < > 0.90 0.91 0.78 0.82 0.91  CALCIUM 8.1*  --   --  8.1* 8.0* 7.4* 8.0*  MG  --   --   --   --  1.9 1.9 1.8  PHOS  --   --   --   --   --  2.2* 1.9*   < > = values in this interval not displayed.   GFR: Estimated Creatinine Clearance: 47.7 mL/min (by C-G formula based on SCr of 0.91 mg/dL).   Liver Function Tests: Recent Labs  Lab 11/03/19 2001 11/04/19 1645 11/05/19 0008  AST 22  --   --  ALT 11  --   --   ALKPHOS 96  --   --   BILITOT 2.4*  --   --   PROT 5.3*  --   --   ALBUMIN 2.0* 1.8* 1.8*   Coagulation Profile: Recent Labs  Lab 11/04/19 2129  INR 1.9*   CBG: Recent Labs  Lab 11/04/19 1915 11/05/19 0541 11/05/19 0614 11/05/19 0802 11/05/19 0911  GLUCAP 72 53* 71 82 63*   Lipid Profile: Recent Labs    11/03/19 2001  TRIG 92   Anemia Panel: Recent Labs    11/03/19 2020  FERRITIN 557*   Urine analysis:    Component Value Date/Time   COLORURINE AMBER (A) 11/03/2019 2101   APPEARANCEUR HAZY (A) 11/03/2019 2101   LABSPEC 1.014 11/03/2019 2101   PHURINE 5.0 11/03/2019 2101   GLUCOSEU NEGATIVE 11/03/2019 2101   HGBUR NEGATIVE 11/03/2019 2101   BILIRUBINUR NEGATIVE 11/03/2019 2101   KETONESUR NEGATIVE 11/03/2019 2101   PROTEINUR 30 (A) 11/03/2019 2101   UROBILINOGEN 1.0 12/16/2014 1330   NITRITE NEGATIVE 11/03/2019 2101   LEUKOCYTESUR NEGATIVE 11/03/2019 2101     Recent Results (from the past 240 hour(s))  Blood Culture (routine x 2)     Status: None (Preliminary result)   Collection Time: 11/03/19  7:00 PM   Specimen: BLOOD  Result Value Ref Range Status   Specimen Description BLOOD RIGHT HAND  Final   Special Requests   Final    BOTTLES DRAWN AEROBIC AND ANAEROBIC Blood Culture results may not be optimal due to an inadequate volume of blood received in culture bottles   Culture   Final    NO GROWTH 2 DAYS Performed at [facility], [address], [place], [place] [address]    Report Status PENDING  Incomplete  Respiratory Panel by RT PCR (Flu A&B, Covid) - Nasopharyngeal Swab     Status: None   Collection Time: 11/03/19  7:43 PM   Specimen: Nasopharyngeal Swab  Result Value Ref Range Status   SARS Coronavirus 2 by RT PCR NEGATIVE NEGATIVE Final    Comment: (NOTE) SARS-CoV-2 target nucleic acids are NOT DETECTED. The SARS-CoV-2 RNA is generally detectable in upper respiratoy specimens during the acute phase of infection. The lowest concentration of SARS-CoV-2 viral copies this assay can detect is 131 copies/mL. A negative result does not preclude SARS-Cov-2 infection and should not be used as the sole basis for treatment or other patient management decisions. A negative result may occur with  improper specimen collection/handling, submission of specimen other than nasopharyngeal swab, presence of viral mutation(s) within the areas targeted by this assay, and inadequate number of viral copies (<131 copies/mL). A negative result must be combined with clinical observations, patient history, and epidemiological information. The expected result is Negative. Fact Sheet for Patients:  https://www.moore.com/ Fact Sheet for Healthcare Providers:  https://www.young.biz/ This test is not yet ap proved or cleared by the [place] FDA and  has been authorized for detection and/or diagnosis of  SARS-CoV-2 by FDA under an Emergency Use Authorization (EUA). This EUA will remain  in effect (meaning this test can be used) for the duration of the COVID-19 declaration under Section 564(b)(1) of the Act, 21 U.S.C. section 360bbb-3(b)(1), unless the authorization is terminated or revoked sooner.    Influenza A by PCR NEGATIVE NEGATIVE Final   Influenza B by PCR NEGATIVE NEGATIVE Final    Comment: (NOTE) The Xpert Xpress SARS-CoV-2/FLU/RSV assay is intended as an aid in  the  diagnosis of influenza from Nasopharyngeal swab specimens and  should not be used as a sole basis for treatment. Nasal washings and  aspirates are unacceptable for Xpert Xpress SARS-CoV-2/FLU/RSV  testing. Fact Sheet for Patients: https://www.moore.com/ Fact Sheet for Healthcare Providers: https://www.young.biz/ This test is not yet approved or cleared by the [place] FDA and  has been authorized for detection and/or diagnosis of SARS-CoV-2 by  FDA under an Emergency Use Authorization (EUA). This EUA will remain  in effect (meaning this test can be used) for the duration of the  Covid-19 declaration under Section 564(b)(1) of the Act, 21  U.S.C. section 360bbb-3(b)(1), unless the authorization is  terminated or revoked. Performed at [facility], [address], [place], [place] [address]   Blood Culture (routine x 2)     Status: None (Preliminary result)   Collection Time: 11/03/19  8:02 PM   Specimen: BLOOD  Result Value Ref Range Status   Specimen Description BLOOD RIGHT HAND  Final   Special Requests   Final    BOTTLES DRAWN AEROBIC ONLY Blood Culture results may not be optimal due to an inadequate volume of blood received in culture bottles   Culture   Final    NO GROWTH 2 DAYS Performed at [facility], [address] [place], [place] [address]    Report Status PENDING  Incomplete  Urine culture     Status: None   Collection Time: 11/03/19   9:01 PM   Specimen: Urine, Catheterized  Result Value Ref Range Status   Specimen Description URINE, CATHETERIZED  Final   Special Requests NONE  Final   Culture   Final    NO GROWTH Performed at [facility], [address], [place], [place] [address]    Report Status 11/04/2019 FINAL  Final  MRSA PCR Screening     Status: None   Collection Time: 11/04/19 10:47 PM   Specimen: Nasopharyngeal  Result Value Ref Range Status   MRSA by PCR NEGATIVE NEGATIVE Final    Comment:        The GeneXpert MRSA Assay (FDA approved for NASAL specimens only), is one component of a comprehensive MRSA colonization surveillance program. It is not intended to diagnose MRSA infection nor to guide or monitor treatment for MRSA infections. Performed at [facility], [address], [place], [place] [address]      Radiology Studies: EEG  Result Date: 11/04/2019 Charlsie Quest, MD     11/04/2019  9:19 AM Patient Name: NATHAN STALLWORTH MRN: [id number] Epilepsy Attending: Charlsie Quest Referring Physician/Provider: Dr Midge Minium Date: 11/04/2019 Duration: 24.40 mins Patient history: 79 y.o. female with history of advanced dementia, hypertension, hyperlipidemia, history of seizures who presented with SOB and AMS. Level of alertness: lethargic AEDs during EEG study: LEV Technical aspects: This EEG study was done with scalp electrodes positioned according to the 10-20 International system of electrode placement. Electrical activity was acquired at a sampling rate of 500Hz  and reviewed with a high frequency filter of 70Hz  and a low frequency filter of 1Hz . EEG data were recorded continuously and digitally stored. DESCRIPTION: No clear posterior dominant rhythm was seen. EEG showed continuous generalized, maximal bitemporal 3-6Hz  theta-delta slowing. Hyperventilation and photic stimulation were not performed. ABNORMALITY - Continuous slow, generalized IMPRESSION: This study is suggestive of  bitemporal cortical dysfunction which could be secondary to dementia. Additionally, there is evidence of mild to moderate diffuse encephalopathy, non specific to etiology. No  seizures or epileptiform discharges were seen throughout the recording. Charlsie QuestPriyanka O Yadav   DG Chest Port 1 View  Result Date: 11/03/2019 CLINICAL DATA:  Pneumonia EXAM: PORTABLE CHEST 1 VIEW COMPARISON:  April 22, 2018 FINDINGS: There is consolidation within the right mid and right lower lung zones. There is no pneumothorax. No large pleural effusion. There is mild elevation of the left hemidiaphragm. The heart size is normal. Dense aortic calcifications are noted. There is no acute osseous abnormality. IMPRESSION: Right-sided pneumonia as above. Follow-up to radiologic resolution is recommended. Aortic Atherosclerosis (ICD10-I70.0). Electronically Signed   By: Katherine Mantlehristopher  Green M.D.   On: 11/03/2019 20:14    Scheduled Meds: . aspirin EC  81 mg Oral Daily  . chlorhexidine  15 mL Mouth Rinse BID  . cloNIDine  0.3 mg Transdermal Weekly  . enoxaparin (LOVENOX) injection  40 mg Subcutaneous Q24H  . mouth rinse  15 mL Mouth Rinse q12n4p  . rivastigmine  6 mg Oral BID  . scopolamine  1 patch Transdermal Q72H   Continuous Infusions: . sodium chloride 10 mL/hr at 11/05/19 0457  . azithromycin 500 mg (11/04/19 2308)  . cefTRIAXone (ROCEPHIN)  IV Stopped (11/04/19 2304)  . dextrose 50 mL/hr at 11/05/19 0922  . levETIRAcetam 500 mg (11/05/19 0930)     LOS: 2 days    Time spent: 35 minutes  Vassie Lollarlos Maykayla Highley [phone]

## 2019-11-05 NOTE — Progress Notes (Signed)
Marcelino Duster RN took POC CBG = 53 Hypoglycemic standing orders initialized - D50 via PIV given.

## 2019-11-05 NOTE — Consult Note (Signed)
WOC Nurse Consult Note: Patient receiving care in [facility] 2W15.  Assisted with turning for wound assessment by primary RN, Amy. Reason for Consult: "stage 3 pressure injury" Wound type: UNSTAGEABLE PI to sacrum and coccyx Pressure Injury POA: Yes Measurement: 4.2 cm x 7 cm Wound bed: grey, brown, yellow, butterfly shaped Drainage (amount, consistency, odor) none Periwound: intact Dressing procedure/placement/frequency: Apply Santyl to the sacral/coccyx wound in a nickel thick layer. Cover with a saline moistened gauze, then a foam dressing.  Change daily. I have also ordered an air mattress. Monitor the wound area(s) for worsening of condition such as: Signs/symptoms of infection,  Increase in size,  Development of or worsening of odor, Development of pain, or increased pain at the affected locations.  Notify the medical team if any of these develop.  Thank you for the consult.  Discussed plan of care with the bedside nurse.  WOC nurse will not follow at this time.  Please re-consult the WOC team if needed.  Helmut Muster, RN, MSN, CWOCN, CNS-BC, pager [phone]

## 2019-11-05 NOTE — Significant Event (Signed)
Rapid Response Event Note  Overview: Time Called: 0907 Arrival Time: 0912 Event Type: Respiratory Pt unresponsive, RN unable to get oxygen saturation to read. Placed on 15L NRB.  Initial Focused Assessment: Pt lying in bed. Mouth breathing, RR 16. BP 151/86, HR 60 bpm. SpO2 reading on pt ear, 96%. Pt transitioned back to [facility] and sustained oxygen saturation >92%. Pt responds to pain. Movement noted in all four extremities. PERRLA, 3 mm. Pt mumbles words, speech incomprehensible. Extremities are cold. Pulses weak. Pt having recurrent hypoglycemic events, 12.5g IV dextrose given. Mucous membranes moist, saliva thick.   Interventions: -Dr. Gwenlyn Perking at bedside to assess pt. -Review of chart, reads that this is not an acute change in patient's mentation. -Palliative care consult placed. -Dextrose 50% gtt ordered for recurrent hypoglycemia. MD to discuss goals of care and plan for initiating tube feeds with pt family.  -Recheck CBG 15 minutes after treatment, then q4 hours.   Plan of Care (if not transferred): -Keep HOB at 30 degrees -Suction set up at bedside -Continuous SpO2 monitoring -Trend VS for change -Oral care, per ordered  Event Summary: Name of Physician Notified: Dr. Gwenlyn Perking at [phone]    at    Outcome: Stayed in room and stabalized  Event End Time: 0920  Melissa Jarvis

## 2019-11-06 LAB — BASIC METABOLIC PANEL
Anion gap: 12 (ref 5–15)
BUN: 21 mg/dL (ref 8–23)
CO2: 20 mmol/L — ABNORMAL LOW (ref 22–32)
Calcium: 8 mg/dL — ABNORMAL LOW (ref 8.9–10.3)
Chloride: 121 mmol/L — ABNORMAL HIGH (ref 98–111)
Creatinine, Ser: 0.79 mg/dL (ref 0.44–1.00)
GFR calc Af Amer: >60 mL/min (ref >60)
GFR calc non Af Amer: >60 mL/min (ref >60)
Glucose, Bld: 85 mg/dL (ref 70–99)
Potassium: 3.2 mmol/L — ABNORMAL LOW (ref 3.5–5.1)
Sodium: 153 mmol/L — ABNORMAL HIGH (ref 135–145)

## 2019-11-06 LAB — GLUCOSE, CAPILLARY
Glucose-Capillary: 99 mg/dL (ref 70–99)
Glucose-Capillary: 80 mg/dL (ref 70–99)
Glucose-Capillary: 92 mg/dL (ref 70–99)
Glucose-Capillary: 91 mg/dL (ref 70–99)
Glucose-Capillary: 93 mg/dL (ref 70–99)
Glucose-Capillary: 95 mg/dL (ref 70–99)
Glucose-Capillary: 53 mg/dL — ABNORMAL LOW (ref 70–99)
Glucose-Capillary: 91 mg/dL (ref 70–99)

## 2019-11-06 MED ORDER — DEXTROSE 50 % IV SOLN
12.5000 g | INTRAVENOUS | Status: AC
  Administered 2019-11-06: 12.5 g via INTRAVENOUS

## 2019-11-06 MED ORDER — DEXTROSE 50 % IV SOLN
INTRAVENOUS | Status: AC
  Filled 2019-11-06: qty 50

## 2019-11-06 MED ORDER — ONDANSETRON HCL 4 MG/2ML IJ SOLN: 4.0000 mg | Freq: Four times a day (QID) | INTRAMUSCULAR | Status: DC | PRN

## 2019-11-06 MED ORDER — MORPHINE SULFATE (PF) 2 MG/ML IV SOLN
1.0000 mg | INTRAVENOUS | Status: DC | PRN
  Administered 2019-11-07: 2 mg via INTRAVENOUS
  Filled 2019-11-06: qty 1

## 2019-11-06 MED ORDER — SODIUM CHLORIDE 0.9 % IV SOLN: INTRAVENOUS | Status: DC

## 2019-11-06 MED ORDER — LORAZEPAM 2 MG/ML IJ SOLN: 1.0000 mg | INTRAMUSCULAR | Status: DC | PRN

## 2019-11-06 MED ORDER — GLYCOPYRROLATE 0.2 MG/ML IJ SOLN: 0.3000 mg | INTRAMUSCULAR | Status: DC | PRN

## 2019-11-06 MED ORDER — MORPHINE SULFATE (CONCENTRATE) 10 MG/0.5ML PO SOLN: 5.0000 mg | ORAL | Status: DC | PRN

## 2019-11-06 MED ORDER — POLYVINYL ALCOHOL 1.4 % OP SOLN
1.0000 [drp] | Freq: Four times a day (QID) | OPHTHALMIC | Status: DC | PRN
  Filled 2019-11-06: qty 15

## 2019-11-06 MED ORDER — BIOTENE DRY MOUTH MT LIQD: 15.0000 mL | OROMUCOSAL | Status: DC | PRN

## 2019-11-06 NOTE — Progress Notes (Signed)
Pt has orders to be transitioned to Hospice care in the morning at SNF.  Family was notified,and were aware of visiting hours. No family members visited.   Per MD orders pt's D10 fluid was to be d/c'd once family had visited. With no visit occurring, LPN proceeded with next MD order to switch maintenance fluids to NS.  LPN will continue to monitor pt.

## 2019-11-06 NOTE — TOC Initial Note (Signed)
Transition of Care [facility]) - Initial/Assessment Note    Patient Details  Name: Melissa Jarvis MRN: [id number] Date of Birth: [birth year]  Transition of Care [facility]) CM/SW Contact:    Kermit Balo, RN Phone Number: 11/06/2019, 3:17 PM  Clinical Narrative:                 [facility] consulted for residential hospice facility. TOC referral requesting [facility]. CM has called Cheri with Hospice and made referral. She will reach out to the daughter.  TOC following.  Expected Discharge Plan: Hospice Medical Facility Barriers to Discharge: Hospice Bed not available   Patient Goals and CMS Choice   CMS Medicare.gov Compare Post Acute Care list provided to:: Patient Represenative (must comment) Choice offered to / list presented to : Adult Children  Expected Discharge Plan and Services Expected Discharge Plan: Hospice Medical Facility       Living arrangements for the past 2 months: Skilled Nursing Facility                                      Prior Living Arrangements/Services Living arrangements for the past 2 months: Skilled Nursing Facility Lives with:: Facility Resident                   Activities of Daily Living      Permission Sought/Granted                  Emotional Assessment              Admission diagnosis:  Hypokalemia [E87.6] Hypernatremia [E87.0] Sepsis (HCC) [A41.9] Community acquired pneumonia of right lower lobe of lung [J18.9] Sepsis with acute organ dysfunction without septic shock, due to unspecified organism, unspecified type (HCC) [A41.9, R65.20] Patient Active Problem List   Diagnosis Date Noted  . Pressure injury of skin 11/05/2019  . Acute respiratory failure with hypoxia (HCC) 11/03/2019  . Sepsis (HCC) 11/03/2019  . Real time reverse transcriptase PCR positive for COVID-19 virus 06/02/2019  . Malnutrition of moderate degree 12/28/2018  . Palliative care by specialist   . Goals of care,  counseling/discussion   . Emesis 12/27/2018  . Enterococcus UTI 04/29/2018  . CAP (community acquired pneumonia) 04/25/2018  . Multifocal pneumonia 04/22/2018  . Post-ictal state (HCC) 02/18/2018  . Urinary tract infection with hematuria   . Seizure (HCC) 02/11/2018  . Chronic constipation 02/09/2018  . Dementia (HCC) 12/16/2017  . Failure to thrive in adult 12/16/2017  . Hypernatremia 12/16/2017  . Hypokalemia 12/16/2017  . Hypertension 12/16/2017  . 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 Hyperlipidemia 12/16/2017  . Osteoporosis 12/16/2017  . Vitamin B12 deficiency 12/16/2017  . Depression 12/16/2017   PCP:  [name], MD Pharmacy:  No Pharmacies Listed    Social Determinants of Health (SDOH) Interventions    Readmission Risk Interventions No flowsheet data found.

## 2019-11-06 NOTE — Progress Notes (Signed)
PROGRESS NOTE    EBONEE STOBER    Code Status: DNR  YQM:[id number] DOB: [birth year] DOA: 11/03/2019  PCP: [name], MD    Hospital Summary  This is a 79 year old female with advanced dementia, hypertension, hyperlipidemia, seizures who presented with fever, shortness of breath and worsening mentation with right-sided pneumonia and hypokalemia.  Also with hypoglycemia.  After continued goals of care discussion and palliative care consult, patient was transitioned to comfort measures on 1/20 with goal to discharge to hospice facility once bed is available.  A & P   Principal Problem:   Acute respiratory failure with hypoxia (HCC) Active Problems:   Dementia (HCC)   Hypernatremia   Hypertension   Sepsis (HCC)   Pressure injury of skin   1. Acute toxic respiratory failure with sepsis secondary to pneumonia 2. Electrolyte abnormalities: Hyponatremia, hypokalemia, hypoglycemia 3. Hypertension 4. Thrombocytopenia 5. Severe protein calorie malnutrition/failure to thrive 6. Advanced dementia 7. Hyperlipidemia 8. History of seizures  Patient has had poor improvement and has even had some deterioration.  Due to this palliative care team was consulted and has had further goals of care discussion with patient's family who have agreed to have patient transition to comfort measures with plan to be discharged to hospice facility once bed is available.  We will continue with as needed medications per comfort protocol as per palliative care team.  Family Communication: Patient's family updated by palliative care Disposition Plan: Plan for discharge to hospice facility once bed is available, currently on comfort measures.  Consultants  Palliative care  Procedures  None  Antibiotics   Anti-infectives (From admission, onward)   Start     Dose/Rate Route Frequency Ordered Stop   11/04/19 2130  vancomycin (VANCOCIN) IVPB 1000 mg/200 mL premix  Status:  Discontinued     1,000  mg 200 mL/hr over 60 Minutes Intravenous Every 24 hours 11/03/19 2031 11/03/19 2225   11/04/19 2000  vancomycin (VANCOCIN) IVPB 1000 mg/200 mL premix     1,000 mg 200 mL/hr over 60 Minutes Intravenous To Radiology 11/04/19 1946 11/04/19 2106   11/03/19 2300  azithromycin (ZITHROMAX) 500 mg in sodium chloride 0.9 % 250 mL IVPB  Status:  Discontinued     500 mg 250 mL/hr over 60 Minutes Intravenous Every 24 hours 11/03/19 2127 11/06/19 1215   11/03/19 2200  cefTRIAXone (ROCEPHIN) 2 g in sodium chloride 0.9 % 100 mL IVPB  Status:  Discontinued     2 g 200 mL/hr over 30 Minutes Intravenous Every 24 hours 11/03/19 2127 11/06/19 1215   11/03/19 2045  vancomycin (VANCOREADY) IVPB 1500 mg/300 mL  Status:  Discontinued     1,500 mg 150 mL/hr over 120 Minutes Intravenous  Once 11/03/19 2031 11/03/19 2225   11/03/19 2000  ceFEPIme (MAXIPIME) 2 g in sodium chloride 0.9 % 100 mL IVPB     2 g 200 mL/hr over 30 Minutes Intravenous  Once 11/03/19 1959 11/03/19 2100           Subjective   Seen and examined at bedside no acute distress.  Sleeping.  She is not awakening to verbal stimuli.  Unable to provide history.  Objective   Vitals:   11/05/19 1610 11/05/19 2301 11/06/19 0526 11/06/19 0811  BP: (!) 155/74 140/66 (!) 155/76 128/87  Pulse: 69 60 70 66  Resp:  20 16   Temp: 97.7 F (36.5 C) 97.7 F (36.5 C) 98.3 F (36.8 C) 98.8 F (37.1 C)  TempSrc: Axillary  Axillary  Axillary  SpO2: 100%  100% 100%  Weight:        Intake/Output Summary (Last 24 hours) at 11/06/2019 1655 Last data filed at 11/06/2019 0600 Gross per 24 hour  Intake 1161.96 ml  Output 550 ml  Net 611.96 ml   Filed Weights   11/04/19 2236  Weight: 64.4 kg    Examination:  Physical Exam Vitals and nursing note reviewed.  Constitutional:      Comments: Sleeping   HENT:     Head: Normocephalic.     Mouth/Throat:     Comments: Not opening mouth Cardiovascular:     Rate and Rhythm: Normal rate.  Pulmonary:      Effort: Pulmonary effort is normal.  Abdominal:     General: Abdomen is flat. There is no distension.  Musculoskeletal:        General: No swelling or tenderness.  Neurological:     Comments: Not arousable to verbal stimuli     Data Reviewed: I have personally reviewed following labs and imaging studies  CBC: Recent Labs  Lab 11/03/19 2016 11/03/19 2019 11/04/19 0456 11/04/19 2129 11/05/19 0008  WBC  --  23.1* 25.1* 15.0* 22.2*  NEUTROABS  --  19.6*  --   --  19.2*  HGB 12.9 14.1 13.1 8.3* 13.0  HCT 38.0 45.9 43.1 28.3* 40.4  MCV  --  101.1* 102.6* 106.8* 95.3  PLT  --  100* 77* 65* 77*   Basic Metabolic Panel: Recent Labs  Lab 11/04/19 0456 11/04/19 1201 11/04/19 1645 11/05/19 0008 11/06/19 0305  NA 158* 151* 143 147* 153*  K 2.3* 7.3* 2.9* 5.6* 3.2*  CL 127* 124* 115* 118* 121*  CO2 20* 20* 16* 19* 20*  GLUCOSE 107* 101* 359* 159* 85  BUN 22 22 19 21 21   CREATININE 0.91 0.78 0.82 0.91 0.79  CALCIUM 8.1* 8.0* 7.4* 8.0* 8.0*  MG  --  1.9 1.9 1.8  --   PHOS  --   --  2.2* 1.9*  --    GFR: Estimated Creatinine Clearance: 54.3 mL/min (by C-G formula based on SCr of 0.79 mg/dL). Liver Function Tests: Recent Labs  Lab 11/03/19 2001 11/04/19 1645 11/05/19 0008  AST 22  --   --   ALT 11  --   --   ALKPHOS 96  --   --   BILITOT 2.4*  --   --   PROT 5.3*  --   --   ALBUMIN 2.0* 1.8* 1.8*   No results for input(s): LIPASE, AMYLASE in the last 168 hours. No results for input(s): AMMONIA in the last 168 hours. Coagulation Profile: Recent Labs  Lab 11/04/19 2129  INR 1.9*   Cardiac Enzymes: No results for input(s): CKTOTAL, CKMB, CKMBINDEX, TROPONINI in the last 168 hours. BNP (last 3 results) No results for input(s): PROBNP in the last 8760 hours. HbA1C: No results for input(s): HGBA1C in the last 72 hours. CBG: Recent Labs  Lab 11/06/19 0345 11/06/19 0812 11/06/19 0818 11/06/19 1115 11/06/19 1206  GLUCAP 91 93 95 53* 91   Lipid  Profile: Recent Labs    11/03/19 2001  TRIG 92   Thyroid Function Tests: No results for input(s): TSH, T4TOTAL, FREET4, T3FREE, THYROIDAB in the last 72 hours. Anemia Panel: Recent Labs    11/03/19 2020  FERRITIN 557*   Sepsis Labs: Recent Labs  Lab 11/03/19 2001  LATICACIDVEN 3.1*    Recent Results (from the past 240 hour(s))  Blood Culture (routine x 2)  Status: None (Preliminary result)   Collection Time: 11/03/19  7:00 PM   Specimen: BLOOD  Result Value Ref Range Status   Specimen Description BLOOD RIGHT HAND  Final   Special Requests   Final    BOTTLES DRAWN AEROBIC AND ANAEROBIC Blood Culture results may not be optimal due to an inadequate volume of blood received in culture bottles   Culture   Final    NO GROWTH 3 DAYS Performed at [facility], [address] [place], [place] [address]    Report Status PENDING  Incomplete  Respiratory Panel by RT PCR (Flu A&B, Covid) - Nasopharyngeal Swab     Status: None   Collection Time: 11/03/19  7:43 PM   Specimen: Nasopharyngeal Swab  Result Value Ref Range Status   SARS Coronavirus 2 by RT PCR NEGATIVE NEGATIVE Final    Comment: (NOTE) SARS-CoV-2 target nucleic acids are NOT DETECTED. The SARS-CoV-2 RNA is generally detectable in upper respiratoy specimens during the acute phase of infection. The lowest concentration of SARS-CoV-2 viral copies this assay can detect is 131 copies/mL. A negative result does not preclude SARS-Cov-2 infection and should not be used as the sole basis for treatment or other patient management decisions. A negative result may occur with  improper specimen collection/handling, submission of specimen other than nasopharyngeal swab, presence of viral mutation(s) within the areas targeted by this assay, and inadequate number of viral copies (<131 copies/mL). A negative result must be combined with clinical observations, patient history, and epidemiological information. The expected  result is Negative. Fact Sheet for Patients:  https://www.moore.com/ Fact Sheet for Healthcare Providers:  https://www.young.biz/ This test is not yet ap proved or cleared by the [place] FDA and  has been authorized for detection and/or diagnosis of SARS-CoV-2 by FDA under an Emergency Use Authorization (EUA). This EUA will remain  in effect (meaning this test can be used) for the duration of the COVID-19 declaration under Section 564(b)(1) of the Act, 21 U.S.C. section 360bbb-3(b)(1), unless the authorization is terminated or revoked sooner.    Influenza A by PCR NEGATIVE NEGATIVE Final   Influenza B by PCR NEGATIVE NEGATIVE Final    Comment: (NOTE) The Xpert Xpress SARS-CoV-2/FLU/RSV assay is intended as an aid in  the diagnosis of influenza from Nasopharyngeal swab specimens and  should not be used as a sole basis for treatment. Nasal washings and  aspirates are unacceptable for Xpert Xpress SARS-CoV-2/FLU/RSV  testing. Fact Sheet for Patients: https://www.moore.com/ Fact Sheet for Healthcare Providers: https://www.young.biz/ This test is not yet approved or cleared by the [place] FDA and  has been authorized for detection and/or diagnosis of SARS-CoV-2 by  FDA under an Emergency Use Authorization (EUA). This EUA will remain  in effect (meaning this test can be used) for the duration of the  Covid-19 declaration under Section 564(b)(1) of the Act, 21  U.S.C. section 360bbb-3(b)(1), unless the authorization is  terminated or revoked. Performed at [facility], [address] [place] [address], [place], [place] [address]   Blood Culture (routine x 2)     Status: None (Preliminary result)   Collection Time: 11/03/19  8:02 PM   Specimen: BLOOD  Result Value Ref Range Status   Specimen Description BLOOD RIGHT HAND  Final   Special Requests   Final    BOTTLES DRAWN AEROBIC ONLY Blood Culture results may  not be optimal due to an inadequate volume of blood received in culture bottles   Culture   Final  NO GROWTH 3 DAYS Performed at [facility], [address] [place], [place] [address]    Report Status PENDING  Incomplete  Urine culture     Status: None   Collection Time: 11/03/19  9:01 PM   Specimen: Urine, Catheterized  Result Value Ref Range Status   Specimen Description URINE, CATHETERIZED  Final   Special Requests NONE  Final   Culture   Final    NO GROWTH Performed at [facility], [address], [place], [place] [address]    Report Status 11/04/2019 FINAL  Final  MRSA PCR Screening     Status: None   Collection Time: 11/04/19 10:47 PM   Specimen: Nasopharyngeal  Result Value Ref Range Status   MRSA by PCR NEGATIVE NEGATIVE Final    Comment:        The GeneXpert MRSA Assay (FDA approved for NASAL specimens only), is one component of a comprehensive MRSA colonization surveillance program. It is not intended to diagnose MRSA infection nor to guide or monitor treatment for MRSA infections. Performed at [facility], [address] [place], [place] [address]          Radiology Studies: No results found.      Scheduled Meds: . collagenase   Topical Daily  . mouth rinse  15 mL Mouth Rinse q12n4p  . scopolamine  1 patch Transdermal Q72H   Continuous Infusions: . sodium chloride       LOS: 3 days    Time spent: 20 minutes with over 50% of the time coordinating the patient's care    Jae Dire, DO [facility] Pager [phone]  If 7PM-7AM, please contact night-coverage [email or web address] Password [email or web address] 11/06/2019, 4:55 PM

## 2019-11-06 NOTE — Progress Notes (Signed)
Hypoglycemic Event  CBG: 53  Treatment: 25G D50  Symptoms: n/a  Follow-up CBG: Time:1206 CBG Result:91  Possible Reasons for Event: no oral intake. Already on D10 infusion. Patient transitioning to residential hospice.   Comments/MD notified:Dr. Jason Fila [facility]

## 2019-11-06 NOTE — Progress Notes (Signed)
SLP Cancellation Note  Patient Details Name: SIGNA CHEEK MRN: [id number] DOB: [birth year]   Cancelled treatment:       Reason Eval/Treat Not Completed: Other (comment). Pt transitioning to hospice. Orders cancelled. Please reconsult if needs arise, or if family needs education.  Espn Zeman B. Murvin Natal, [facility], CCC-SLP Speech Language Pathologist Office: [phone] Pager: [phone]   Leigh Aurora 11/06/2019, 12:30 PM

## 2019-11-06 NOTE — Progress Notes (Signed)
Daily Progress Note   Patient Name: Melissa Jarvis       Date: 11/06/2019 DOB: [birth year]  Age: 79 y.o. MRN#: [id number] Attending Physician: Jae Dire, MD Primary Care Physician: [name], MD Admit Date: 11/03/2019  Reason for Consultation/Follow-up: Establishing goals of care  Subjective: Patient resting in bed. No acute distress noted. Remains unresponsive to verbal stimuli or commands. Will not open eyes on assessment. RN at the bedside administering D50 due to CBG of 53. Patient continues to have poor po intake and showing no signs of improvement. No family at the bedside.   I called and spoke to patient's daughters [name] and [place]) via phone conference. Updates provided. Daughters tearful in discussion. Support given. They verbalized understanding of updates and their mother's poor prognosis. At this time they are requesting to transition all care to full comfort/EOL support.   I re-educated family on what comfort care would look like for patient while hospitalized. Daughters verbalized understanding and again confirmed wishes to transition care to comfort.   We discussed outpatient hospice support in the home, back at [facility], and at residential hospice facility. Family expressed they are not able to care for her in the home and they would not prefer for her to return to SNF for EOL care. They are requesting patient be considered for hospice home placement to continue care. Confirmed wishes and educated family on care at hospice. They verbalized understanding and confirming wishes for patient to transfer to the location in [facility]. I educated daughters on referral process for [facility]. They are also aware depending on bed availability patient  could potentially pass away while hospitalized.   Daughters spent time sharing memories of their mother and wishes for her not to continue to suffer. Therapeutic listening and support given. Family aware that the nursing staff does a great job assessing EOL patients and managing symptoms by assessing nonverbal cues such as changes in respirations, heart rate, grimacing, brow furring, or signs of agitation. Daughters verbalized understanding and appreciation.   Both [name] and [place] confirm they plan to come up and be at the bedside with patient. I reviewed in detail visitation policy in the setting of COVID-19. Daughters verbalized understanding.   All questions answered and support given.   Length of Stay: 3  Current Medications: Scheduled Meds:  . aspirin EC  81 mg Oral Daily  . chlorhexidine  15 mL Mouth Rinse BID  . cloNIDine  0.3 mg Transdermal Weekly  . collagenase   Topical Daily  . mouth rinse  15 mL Mouth Rinse q12n4p  . rivastigmine  6 mg Oral BID  . scopolamine  1 patch Transdermal Q72H    Continuous Infusions: . sodium chloride 10 mL/hr at 11/05/19 0457  . azithromycin Stopped (11/06/19 0200)  . cefTRIAXone (ROCEPHIN)  IV Stopped (11/05/19 2230)  . dextrose 50 mL/hr at 11/06/19 0440  . levETIRAcetam 500 mg (11/06/19 0911)    PRN Meds: acetaminophen **OR** acetaminophen, hydrALAZINE, ondansetron **OR** ondansetron (ZOFRAN) IV  Physical Exam       -lethargic, unresponsive, chronically-ill appearing -diminished bilaterally -  RRR  Vital Signs: BP 128/87 (BP Location: Right Arm)   Pulse 66   Temp 98.8 F (37.1 C) (Axillary)   Resp 16   Wt 64.4 kg   SpO2 100%   BMI 22.92 kg/m  SpO2: SpO2: 100 % O2 Device: O2 Device: Nasal Cannula O2 Flow Rate: O2 Flow Rate (L/min): 2 L/min  Intake/output summary:   Intake/Output Summary (Last 24 hours) at 11/06/2019 1151 Last data filed at 11/06/2019 0600 Gross per 24 hour  Intake 1571.34 ml  Output 550 ml  Net 1021.34  ml   LBM:   Baseline Weight: Weight: 64.4 kg Most recent weight: Weight: 64.4 kg       Palliative Assessment/Data:Comfort      Patient Active Problem List   Diagnosis Date Noted  . Pressure injury of skin 11/05/2019  . Acute respiratory failure with hypoxia ([place]) 11/03/2019  . Sepsis ([place]) 11/03/2019  . Real time reverse transcriptase PCR positive for COVID-19 virus 06/02/2019  . Malnutrition of moderate degree 12/28/2018  . Palliative care by specialist   . Goals of care, counseling/discussion   . Emesis 12/27/2018  . Enterococcus UTI 04/29/2018  . CAP (community acquired pneumonia) 04/25/2018  . Multifocal pneumonia 04/22/2018  . Post-ictal state ([place]) 02/18/2018  . Urinary tract infection with hematuria   . Seizure ([place]) 02/11/2018  . Chronic constipation 02/09/2018  . Dementia ([place]) 12/16/2017  . Failure to thrive in adult 12/16/2017  . Hypernatremia 12/16/2017  . Hypokalemia 12/16/2017  . Hypertension 12/16/2017  . 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 Hyperlipidemia 12/16/2017  . Osteoporosis 12/16/2017  . Vitamin B12 deficiency 12/16/2017  . Depression 12/16/2017    Palliative Care Assessment & Plan   Recommendations/Plan: DNR/DNI Transition all care to comfort as requested by daughters [facility] referral for [facility]  Will d/c all orders not comfort focused. Daughters will be up after lunchtime. Will continue D10 until 3pm with anticipation daughters would have arrived. Hopeful patient will not further decline given drops in blood sugars previously. Will transition to NS @ KVO at 3pm to maintain line for PRN medications Morphine PRN for pain/air hunger Robinul PRN for secretions Ativan PRN for anxiety/agitation Liquifilm tears PRN for dry eyes PMT will continue to support and follow as needed.   Goals of Care and Additional Recommendations: Limitations on Scope of Treatment: Full Comfort Care  Code Status:    Code Status Orders  (From  admission, onward)         Start     Ordered   11/05/19 0929  Do not attempt resuscitation (DNR)  Continuous    Question Answer Comment  In the event of cardiac or respiratory ARREST Do not call  a "code blue"   In the event of cardiac or respiratory ARREST Do not perform Intubation, CPR, defibrillation or ACLS   In the event of cardiac or respiratory ARREST Use medication by any route, position, wound care, and other measures to relive pain and suffering. May use oxygen, suction and manual treatment of airway obstruction as needed for comfort.      11/05/19 0943        Code Status History    Date Active Date Inactive Code Status Order ID Comments User Context   11/03/2019 2128 11/05/2019 0943 Full Code [id number]  Eduard Clos, MD ED   12/27/2018 1053 12/29/2018 2030 Full Code [id number]  Jonah Blue, MD ED   04/22/2018 2327 04/26/2018 1531 Full Code [id number]  Eduard Clos, MD ED   02/11/2018 1640 02/14/2018 1904 Full Code [id number]  Lennox Solders, MD ED   Advance Care Planning Activity      Prognosis:  Hours - Days in the setting of full comfort, poor nutrition, hypoglycemic episodes, advanced dementia, hypernatremia, sepsis, albumin 1.8, unresponsive,WBC 22.2.   Discharge Planning: Home with Hospice  Care plan was discussed with patient's daughters [name] and Terry), RN.   Thank you for allowing the Palliative Medicine Team to assist in the care of this patient.  Total Time: 50 min.   Greater than 50%  of this time was spent counseling and coordinating care related to the above assessment and plan.  Willette Alma, AGPCNP-BC Palliative Medicine Team  Phone: [phone]  Please contact Palliative Medicine Team phone at [phone] for questions and concerns.

## 2019-11-07 DIAGNOSIS — Z515 Encounter for palliative care: Secondary | ICD-10-CM

## 2019-11-07 MED ORDER — COLLAGENASE 250 UNIT/GM EX OINT: TOPICAL_OINTMENT | Freq: Every day | CUTANEOUS | 0 refills | Status: AC

## 2019-11-07 MED ORDER — MORPHINE SULFATE (CONCENTRATE) 10 MG/0.5ML PO SOLN: 5.0000 mg | ORAL | 0 refills | Status: AC | PRN

## 2019-11-07 MED ORDER — LORAZEPAM 2 MG/ML PO CONC: 1.0000 mg | ORAL | 0 refills | Status: AC | PRN

## 2019-11-07 MED ORDER — POLYVINYL ALCOHOL 1.4 % OP SOLN: 1.0000 [drp] | Freq: Four times a day (QID) | OPHTHALMIC | 0 refills | Status: AC | PRN

## 2019-11-07 MED ORDER — BIOTENE DRY MOUTH MT LIQD: 15.0000 mL | OROMUCOSAL | 0 refills | Status: AC | PRN

## 2019-11-07 MED ORDER — SCOPOLAMINE 1 MG/3DAYS TD PT72: 1.0000 | MEDICATED_PATCH | TRANSDERMAL | 12 refills | Status: AC

## 2019-11-07 NOTE — Care Management (Signed)
Confirmed this morning w Hospice that they are able to accept her today. Bed space confirmed, requested DC note be sent with patient. DC note printed, UC to place on chart.  Nurse notified PTAR called, confirmed Gold DNR on chart. Spoke w patient's daughter to let her know that [facility] Mons will be called for transportation to [facility].

## 2019-11-07 NOTE — Progress Notes (Signed)
Report given to Almira Coaster, Charity fundraiser at [facility].  All questions answered.

## 2019-11-07 NOTE — Plan of Care (Signed)
Pt unresponsive, unable to update on POC

## 2019-11-07 NOTE — Progress Notes (Signed)
Palliative Medicine RN Note: symptom check. Pt is calm and relaxed. RN will recheck BP and give Apresoline prn. Obtained rx to check BP q4 to ensure pt isn't having symptoms r/t hypertension. Confirmed RN has PMT contact information.   Checked with [facility]; they are working up Melissa Jarvis's referral, but there is concern that she may not meet criteria. If they decline to take her, this will become a disposition issue that the [facility] team will need to address with her family.  We will continue to follow for symptom management. If there are palliative-specific questions, please call our office.  Melissa Chance Selassie Spatafore, RN, BSN, [facility] Palliative Medicine Team 11/07/2019 10:51 AM Office [phone]

## 2019-11-07 NOTE — Discharge Summary (Addendum)
Physician Discharge Summary  Melissa ChiquitoBarbara S Jarvis WGN:[id number] DOB: 16-Aug-1941 DOA: 11/03/2019  PCP: [name], Anne D, MD  Admit date: 11/03/2019 Discharge date: 11/07/2019   Code Status: DNR  [facility] Discharged to:  [facility]idential hosp[facility] Discharge Condition: poor prognosis   Recommendations for Outpatient Follow-up   Follow up with Hospice/Palliative team  Hospital Summary  This is a 79 year old female with advanced dementia, hypertension, hyperlipidemia, seizures who presented with fever, shortness of breath and worsening mentation with right-sided pneumonia and treated with antibiotics.  Also with hypoglycemia and electrolyte abnormalities which were treated medically.  After palliative care consult and goals of care discussions with family, patient was transitioned to comfort measures on 1/20 and discharged to hospice facility on 11/07/19. Family was updated by phone.   A & P   Principal Problem:   Acute respiratory failure with hypoxia (HCC) Active Problems:   Dementia (HCC)   Hypernatremia   Hypertension   Sepsis (HCC)   Pressure injury of skin   Acute toxic respiratory failure with sepsis secondary to pneumonia  Electrolyte abnormalities: Hyponatremia, hypokalemia, hypoglycemia Hypertensive urgency Thrombocytopenia Severe protein calorie malnutrition/failure to thrive Advanced dementia Hyperlipidemia History of seizures   Dispo: Patient to be [facility] with PRN medications for comfort as below. Family updated by phone   Consultants  palliative  Procedures  none  Antibiotics   Anti-infectives (From admission, onward)    Start     Dose/Rate Route Frequency Ordered Stop   11/04/19 2130  vancomycin (VANCOCIN) IVPB 1000 mg/200 mL premix  Status:  Discontinued     1,000 mg 200 mL/hr over 60 Minutes Intravenous Every 24 hours 11/03/19 2031 11/03/19 2225   11/04/19 2000  vancomycin (VANCOCIN) IVPB 1000  mg/200 mL premix     1,000 mg 200 mL/hr over 60 Minutes Intravenous To Radiology 11/04/19 1946 11/04/19 2106   11/03/19 2300  azithromycin (ZITHROMAX) 500 mg in sodium chloride 0.9 % 250 mL IVPB  Status:  Discontinued     500 mg 250 mL/hr over 60 Minutes Intravenous Every 24 hours 11/03/19 2127 11/06/19 1215   11/03/19 2200  cefTRIAXone (ROCEPHIN) 2 g in sodium chloride 0.9 % 100 mL IVPB  Status:  Discontinued     2 g 200 mL/hr over 30 Minutes Intravenous Every 24 hours 11/03/19 2127 11/06/19 1215   11/03/19 2045  vancomycin (VANCOREADY) IVPB 1500 mg/300 mL  Status:  Discontinued     1,500 mg 150 mL/hr over 120 Minutes Intravenous  Once 11/03/19 2031 11/03/19 2225   11/03/19 2000  ceFEPIme (MAXIPIME) 2 g in sodium chloride 0.9 % 100 mL IVPB     2 g 200 mL/hr over 30 Minutes Intravenous  Once 11/03/19 1959 11/03/19 2100         Subjective  Patient unresponsive.   Objective   Discharge Exam: Vitals:   11/07/19 0447 11/07/19 1043  BP:  (!) 203/76  Pulse: (!) 46 (!) 57  Resp:  16  Temp:  (!) 97.5 F (36.4 C)  SpO2: (!) 83%    Vitals:   11/06/19 0811 11/07/19 0444 11/07/19 0447 11/07/19 1043  BP: 128/87 (!) 214/87  (!) 203/76  Pulse: 66 60 (!) 46 (!) 57  Resp:  16  16  Temp: 98.8 F (37.1 C) (!) 97.5 F (36.4 C)  (!) 97.5 F (36.4 C)  TempSrc: Axillary Oral  Oral  SpO2: 100%  (!) 83%   Weight:        Physical Exam  Vitals and nursing note reviewed.  Constitutional:      Comments: Sleeping comfortably  HENT:     Head: Normocephalic.     Mouth/Throat:     Mouth: Mucous membranes are dry.  Cardiovascular:     Rate and Rhythm: Normal rate. Rhythm irregular.  Pulmonary:     Effort: No respiratory distress.     Breath sounds: No wheezing.  Abdominal:     General: There is no distension.  Musculoskeletal:        General: No swelling.  Neurological:     Comments: Non responsive       The results of significant diagnostics from this hospitalization  (including imaging, microbiology, ancillary and laboratory) are listed below for reference.     Microbiology: Recent Results (from the past 240 hour(s))  Blood Culture (routine x 2)     Status: None (Preliminary result)   Collection Time: 11/03/19  7:00 PM   Specimen: BLOOD  Result Value Ref Range Status   Specimen Description BLOOD RIGHT HAND  Final   Special Requests   Final    BOTTLES DRAWN AEROBIC AND ANAEROBIC Blood Culture results may not be optimal due to an inadequate volume of blood received in culture bottles   Culture   Final    NO GROWTH 4 DAYS Performed at [facility], [address] [place], [place] [id number]    Report Status PENDING  Incomplete  Respiratory Panel by RT PCR (Flu A&B, Covid) - Nasopharyngeal Swab     Status: None   Collection Time: 11/03/19  7:43 PM   Specimen: Nasopharyngeal Swab  Result Value Ref Range Status   SARS Coronavirus 2 by RT PCR NEGATIVE NEGATIVE Final    Comment: (NOTE) SARS-CoV-2 target nucleic acids are NOT DETECTED. The SARS-CoV-2 RNA is generally detectable in upper respiratoy specimens during the acute phase of infection. The lowest concentration of SARS-CoV-2 viral copies this assay can detect is 131 copies/mL. A negative result does not preclude SARS-Cov-2 infection and should not be used as the sole basis for treatment or other patient management decisions. A negative result may occur with  improper specimen collection/handling, submission of specimen other than nasopharyngeal swab, presence of viral mutation(s) within the areas targeted by this assay, and inadequate number of viral copies (<131 copies/mL). A negative result must be combined with clinical observations, patient history, and epidemiological information. The expected result is Negative. Fact Sheet for Patients:  https://www.moore.com/https://www.fda.gov/media/142436/download Fact Sheet for Healthcare Providers:  https://www.young.biz/https://www.fda.gov/media/142435/download This test is not yet  ap proved or cleared by the [place] FDA and  has been authorized for detection and/or diagnosis of SARS-CoV-2 by FDA under an Emergency Use Authorization (EUA). This EUA will remain  in effect (meaning this test can be used) for the duration of the COVID-19 declaration under Section 564(b)(1) of the Act, 21 U.S.C. section 360bbb-3(b)(1), unless the authorization is terminated or revoked sooner.    Influenza A by PCR NEGATIVE NEGATIVE Final   Influenza B by PCR NEGATIVE NEGATIVE Final    Comment: (NOTE) The Xpert Xpress SARS-CoV-2/FLU/RSV assay is intended as an aid in  the diagnosis of influenza from Nasopharyngeal swab specimens and  should not be used as a sole basis for treatment. Nasal washings and  aspirates are unacceptable for Xpert Xpress SARS-CoV-2/FLU/RSV  testing. Fact Sheet for Patients: https://www.moore.com/https://www.fda.gov/media/142436/download Fact Sheet for Healthcare Providers: https://www.young.biz/https://www.fda.gov/media/142435/download This test is not yet approved or cleared by the [place] FDA and  has been authorized for detection  and/or diagnosis of SARS-CoV-2 by  FDA under an Emergency Use Authorization (EUA). This EUA will remain  in effect (meaning this test can be used) for the duration of the  Covid-19 declaration under Section 564(b)(1) of the Act, 21  U.S.C. section 360bbb-3(b)(1), unless the authorization is  terminated or revoked. Performed at [facility], [address] [place], [place] [address]   Blood Culture (routine x 2)     Status: None (Preliminary result)   Collection Time: 11/03/19  8:02 PM   Specimen: BLOOD  Result Value Ref Range Status   Specimen Description BLOOD RIGHT HAND  Final   Special Requests   Final    BOTTLES DRAWN AEROBIC ONLY Blood Culture results may not be optimal due to an inadequate volume of blood received in culture bottles   Culture   Final    NO GROWTH 4 DAYS Performed at [facility], [address] [place], [place]  [address]    Report Status PENDING  Incomplete  Urine culture     Status: None   Collection Time: 11/03/19  9:01 PM   Specimen: Urine, Catheterized  Result Value Ref Range Status   Specimen Description URINE, CATHETERIZED  Final   Special Requests NONE  Final   Culture   Final    NO GROWTH Performed at [facility], [address], [place], [place] [address]    Report Status 11/04/2019 FINAL  Final  MRSA PCR Screening     Status: None   Collection Time: 11/04/19 10:47 PM   Specimen: Nasopharyngeal  Result Value Ref Range Status   MRSA by PCR NEGATIVE NEGATIVE Final    Comment:        The GeneXpert MRSA Assay (FDA approved for NASAL specimens only), is one component of a comprehensive MRSA colonization surveillance program. It is not intended to diagnose MRSA infection nor to guide or monitor treatment for MRSA infections. Performed at [facility], [address], [place], [place] [address]      Labs: BNP (last 3 results) No results for input(s): BNP in the last 8760 hours. Basic Metabolic Panel: Recent Labs  Lab 11/04/19 0456 11/04/19 1201 11/04/19 1645 11/05/19 0008 11/06/19 0305  NA 158* 151* 143 147* 153*  K 2.3* 7.3* 2.9* 5.6* 3.2*  CL 127* 124* 115* 118* 121*  CO2 20* 20* 16* 19* 20*  GLUCOSE 107* 101* 359* 159* 85  BUN 22 22 19 21 21   CREATININE 0.91 0.78 0.82 0.91 0.79  CALCIUM 8.1* 8.0* 7.4* 8.0* 8.0*  MG  --  1.9 1.9 1.8  --   PHOS  --   --  2.2* 1.9*  --    Liver Function Tests: Recent Labs  Lab 11/03/19 2001 11/04/19 1645 11/05/19 0008  AST 22  --   --   ALT 11  --   --   ALKPHOS 96  --   --   BILITOT 2.4*  --   --   PROT 5.3*  --   --   ALBUMIN 2.0* 1.8* 1.8*   No results for input(s): LIPASE, AMYLASE in the last 168 hours. No results for input(s): AMMONIA in the last 168 hours. CBC: Recent Labs  Lab 11/03/19 2016 11/03/19 2019 11/04/19 0456 11/04/19 2129 11/05/19 0008  WBC  --  23.1* 25.1* 15.0* 22.2*  NEUTROABS  --   19.6*  --   --  19.2*  HGB 12.9 14.1 13.1 8.3* 13.0  HCT 38.0  45.9 43.1 28.3* 40.4  MCV  --  101.1* 102.6* 106.8* 95.3  PLT  --  100* 77* 65* 77*   Cardiac Enzymes: No results for input(s): CKTOTAL, CKMB, CKMBINDEX, TROPONINI in the last 168 hours. BNP: Invalid input(s): POCBNP CBG: Recent Labs  Lab 11/06/19 0345 11/06/19 0812 11/06/19 0818 11/06/19 1115 11/06/19 1206  GLUCAP 91 93 95 53* 91   D-Dimer No results for input(s): DDIMER in the last 72 hours. Hgb A1c No results for input(s): HGBA1C in the last 72 hours. Lipid Profile No results for input(s): CHOL, HDL, LDLCALC, TRIG, CHOLHDL, LDLDIRECT in the last 72 hours. Thyroid function studies No results for input(s): TSH, T4TOTAL, T3FREE, THYROIDAB in the last 72 hours.  Invalid input(s): FREET3 Anemia work up No results for input(s): VITAMINB12, FOLATE, FERRITIN, TIBC, IRON, RETICCTPCT in the last 72 hours. Urinalysis    Component Value Date/Time   COLORURINE AMBER (A) 11/03/2019 2101   APPEARANCEUR HAZY (A) 11/03/2019 2101   LABSPEC 1.014 11/03/2019 2101   PHURINE 5.0 11/03/2019 2101   GLUCOSEU NEGATIVE 11/03/2019 2101   HGBUR NEGATIVE 11/03/2019 2101   BILIRUBINUR NEGATIVE 11/03/2019 2101   KETONESUR NEGATIVE 11/03/2019 2101   PROTEINUR 30 (A) 11/03/2019 2101   UROBILINOGEN 1.0 12/16/2014 1330   NITRITE NEGATIVE 11/03/2019 2101   LEUKOCYTESUR NEGATIVE 11/03/2019 2101   Sepsis Labs Invalid input(s): PROCALCITONIN,  WBC,  LACTICIDVEN Microbiology Recent Results (from the past 240 hour(s))  Blood Culture (routine x 2)     Status: None (Preliminary result)   Collection Time: 11/03/19  7:00 PM   Specimen: BLOOD  Result Value Ref Range Status   Specimen Description BLOOD RIGHT HAND  Final   Special Requests   Final    BOTTLES DRAWN AEROBIC AND ANAEROBIC Blood Culture results may not be optimal due to an inadequate volume of blood received in culture bottles   Culture   Final    NO GROWTH 4 DAYS Performed at  [facility], [address], [place], [place] [address]    Report Status PENDING  Incomplete  Respiratory Panel by RT PCR (Flu A&B, Covid) - Nasopharyngeal Swab     Status: None   Collection Time: 11/03/19  7:43 PM   Specimen: Nasopharyngeal Swab  Result Value Ref Range Status   SARS Coronavirus 2 by RT PCR NEGATIVE NEGATIVE Final    Comment: (NOTE) SARS-CoV-2 target nucleic acids are NOT DETECTED. The SARS-CoV-2 RNA is generally detectable in upper respiratoy specimens during the acute phase of infection. The lowest concentration of SARS-CoV-2 viral copies this assay can detect is 131 copies/mL. A negative result does not preclude SARS-Cov-2 infection and should not be used as the sole basis for treatment or other patient management decisions. A negative result may occur with  improper specimen collection/handling, submission of specimen other than nasopharyngeal swab, presence of viral mutation(s) within the areas targeted by this assay, and inadequate number of viral copies (<131 copies/mL). A negative result must be combined with clinical observations, patient history, and epidemiological information. The expected result is Negative. Fact Sheet for Patients:  https://www.moore.com/ Fact Sheet for Healthcare Providers:  https://www.young.biz/ This test is not yet ap proved or cleared by the [place] FDA and  has been authorized for detection and/or diagnosis of SARS-CoV-2 by FDA under an Emergency Use Authorization (EUA). This EUA will remain  in effect (meaning this test can be used) for the duration of the COVID-19 declaration under Section 564(b)(1) of the Act, 21 U.S.C. section 360bbb-3(b)(1),  unless the authorization is terminated or revoked sooner.    Influenza A by PCR NEGATIVE NEGATIVE Final   Influenza B by PCR NEGATIVE NEGATIVE Final    Comment: (NOTE) The Xpert Xpress SARS-CoV-2/FLU/RSV assay is intended as an aid in   the diagnosis of influenza from Nasopharyngeal swab specimens and  should not be used as a sole basis for treatment. Nasal washings and  aspirates are unacceptable for Xpert Xpress SARS-CoV-2/FLU/RSV  testing. Fact Sheet for Patients: https://www.moore.com/ Fact Sheet for Healthcare Providers: https://www.young.biz/ This test is not yet approved or cleared by the [place] FDA and  has been authorized for detection and/or diagnosis of SARS-CoV-2 by  FDA under an Emergency Use Authorization (EUA). This EUA will remain  in effect (meaning this test can be used) for the duration of the  Covid-19 declaration under Section 564(b)(1) of the Act, 21  U.S.C. section 360bbb-3(b)(1), unless the authorization is  terminated or revoked. Performed at [facility], [address] [place], [place] [address]   Blood Culture (routine x 2)     Status: None (Preliminary result)   Collection Time: 11/03/19  8:02 PM   Specimen: BLOOD  Result Value Ref Range Status   Specimen Description BLOOD RIGHT HAND  Final   Special Requests   Final    BOTTLES DRAWN AEROBIC ONLY Blood Culture results may not be optimal due to an inadequate volume of blood received in culture bottles   Culture   Final    NO GROWTH 4 DAYS Performed at [facility], [address], [place], [place] [address]    Report Status PENDING  Incomplete  Urine culture     Status: None   Collection Time: 11/03/19  9:01 PM   Specimen: Urine, Catheterized  Result Value Ref Range Status   Specimen Description URINE, CATHETERIZED  Final   Special Requests NONE  Final   Culture   Final    NO GROWTH Performed at [facility], [address] [place], [place] [address]    Report Status 11/04/2019 FINAL  Final  MRSA PCR Screening     Status: None   Collection Time: 11/04/19 10:47 PM   Specimen: Nasopharyngeal  Result Value Ref Range Status   MRSA by PCR NEGATIVE NEGATIVE Final     Comment:        The GeneXpert MRSA Assay (FDA approved for NASAL specimens only), is one component of a comprehensive MRSA colonization surveillance program. It is not intended to diagnose MRSA infection nor to guide or monitor treatment for MRSA infections. Performed at [facility], [address] [place], [place] [address]     Discharge Instructions     Discharge Instructions     Diet - low sodium heart healthy   Complete by: As directed    Increase activity slowly   Complete by: As directed       Allergies as of 11/07/2019       Reactions   Penicillins Other (See Comments)   Tolerated Ceftriaxone 04/2018 Did it involve swelling of the face/tongue/throat, SOB, or low BP? Unknown Did it involve sudden or severe rash/hives, skin peeling, or any reaction on the inside of your mouth or nose? Unknown Did you need to seek medical attention at a hospital or doctor's office? Unknown When did it last happen?   unknown    If all above answers are "NO", may proceed with cephalosporin use.  Medication List     STOP taking these medications    amLODipine 10 MG tablet Commonly known as: NORVASC   aspirin 81 MG chewable tablet   bisacodyl 10 MG suppository Commonly known as: DULCOLAX   calcium carbonate 1250 (500 Ca) MG chewable tablet Commonly known as: OS-CAL   cloNIDine 0.3 mg/24hr patch Commonly known as: CATAPRES - Dosed in mg/24 hr   D3-1000 25 MCG (1000 UT) tablet Generic drug: Cholecalciferol   dextrose 5 % and 0.45% NaCl infusion   Ensure   FISH OIL PO   levETIRAcetam 100 MG/ML solution Commonly known as: KEPPRA   losartan 100 MG tablet Commonly known as: COZAAR   MILK OF MAGNESIA PO   Namenda XR 28 MG Cp24 24 hr capsule Generic drug: memantine   Nuedexta 20-10 MG capsule Generic drug: Dextromethorphan-quiNIDine   NUTRITIONAL SUPPLEMENT PO   pravastatin 40 MG tablet Commonly known as: PRAVACHOL   RA SALINE ENEMA RE    rivastigmine 6 MG capsule Commonly known as: EXELON   vitamin B-12 1000 MCG tablet Commonly known as: CYANOCOBALAMIN       TAKE these medications    antiseptic oral rinse Liqd Apply 15 mLs topically as needed for dry mouth.   collagenase ointment Commonly known as: SANTYL Apply topically daily.   LORazepam 2 MG/ML concentrated solution Commonly known as: ATIVAN Place 0.5 mLs (1 mg total) under the tongue every 4 (four) hours as needed for anxiety.   morphine CONCENTRATE 10 MG/0.5ML Soln concentrated solution Place 0.25-0.5 mLs (5-10 mg total) under the tongue every 2 (two) hours as needed for moderate pain (or dyspnea).   polyvinyl alcohol 1.4 % ophthalmic solution Commonly known as: LIQUIFILM TEARS Place 1 drop into both eyes 4 (four) times daily as needed for dry eyes.   scopolamine 1 MG/3DAYS Commonly known as: TRANSDERM-SCOP Place 1 patch (1.5 mg total) onto the skin every 3 (three) days. Start taking on: November 08, 2019         Allergies  Allergen Reactions   Penicillins Other (See Comments)    Tolerated Ceftriaxone 04/2018 Did it involve swelling of the face/tongue/throat, SOB, or low BP? Unknown Did it involve sudden or severe rash/hives, skin peeling, or any reaction on the inside of your mouth or nose? Unknown Did you need to seek medical attention at a hospital or doctor's office? Unknown When did it last happen?   unknown    If all above answers are "NO", may proceed with cephalosporin use.      Time coordinating discharge: under 30 minutes  SIGNED:   Harold Hedge, D.O. [facility] Pager: [phone]  11/07/2019, 12:36 PM

## 2019-11-08 LAB — CULTURE, BLOOD (ROUTINE X 2)
Culture: NO GROWTH
Culture: NO GROWTH

## 2019-11-09 ENCOUNTER — Encounter: Payer: Self-pay | Admitting: Internal Medicine

## 2019-11-09 DIAGNOSIS — I69991 Dysphagia following unspecified cerebrovascular disease: Secondary | ICD-10-CM | POA: Insufficient documentation

## 2019-11-09 DIAGNOSIS — Z7189 Other specified counseling: Secondary | ICD-10-CM | POA: Insufficient documentation

## 2019-11-18 DEATH — deceased
